# Patient Record
Sex: Female | Born: 1955 | Race: Black or African American | Hispanic: No | Smoking: Never smoker
Health system: Southern US, Community
[De-identification: ages and names within clinical notes are randomized; demographics above are authoritative.]

## PROBLEM LIST (undated history)

## (undated) DIAGNOSIS — Z5189 Encounter for other specified aftercare: Secondary | ICD-10-CM

## (undated) DIAGNOSIS — E039 Hypothyroidism, unspecified: Secondary | ICD-10-CM

## (undated) DIAGNOSIS — G4730 Sleep apnea, unspecified: Secondary | ICD-10-CM

## (undated) DIAGNOSIS — R413 Other amnesia: Secondary | ICD-10-CM

## (undated) DIAGNOSIS — M1990 Unspecified osteoarthritis, unspecified site: Secondary | ICD-10-CM

## (undated) DIAGNOSIS — R011 Cardiac murmur, unspecified: Secondary | ICD-10-CM

## (undated) DIAGNOSIS — D649 Anemia, unspecified: Secondary | ICD-10-CM

## (undated) DIAGNOSIS — Z8249 Family history of ischemic heart disease and other diseases of the circulatory system: Secondary | ICD-10-CM

## (undated) DIAGNOSIS — Z8489 Family history of other specified conditions: Secondary | ICD-10-CM

## (undated) DIAGNOSIS — R002 Palpitations: Secondary | ICD-10-CM

## (undated) DIAGNOSIS — B1920 Unspecified viral hepatitis C without hepatic coma: Secondary | ICD-10-CM

## (undated) DIAGNOSIS — R7303 Prediabetes: Secondary | ICD-10-CM

## (undated) DIAGNOSIS — I10 Essential (primary) hypertension: Secondary | ICD-10-CM

## (undated) DIAGNOSIS — R0602 Shortness of breath: Secondary | ICD-10-CM

## (undated) HISTORY — DX: Cardiac murmur, unspecified: R01.1

## (undated) HISTORY — DX: Encounter for other specified aftercare: Z51.89

## (undated) HISTORY — DX: Family history of ischemic heart disease and other diseases of the circulatory system: Z82.49

## (undated) HISTORY — DX: Unspecified osteoarthritis, unspecified site: M19.90

## (undated) HISTORY — DX: Hypothyroidism, unspecified: E03.9

## (undated) HISTORY — DX: Palpitations: R00.2

## (undated) HISTORY — DX: Unspecified viral hepatitis C without hepatic coma: B19.20

## (undated) HISTORY — PX: LIVER BIOPSY: SHX301

## (undated) HISTORY — DX: Shortness of breath: R06.02

## (undated) HISTORY — DX: Essential (primary) hypertension: I10

## (undated) HISTORY — DX: Other amnesia: R41.3

## (undated) HISTORY — DX: Anemia, unspecified: D64.9

---

## 2013-01-17 DIAGNOSIS — K8020 Calculus of gallbladder without cholecystitis without obstruction: Secondary | ICD-10-CM | POA: Insufficient documentation

## 2013-04-17 DIAGNOSIS — B1920 Unspecified viral hepatitis C without hepatic coma: Secondary | ICD-10-CM

## 2013-04-17 HISTORY — DX: Unspecified viral hepatitis C without hepatic coma: B19.20

## 2013-10-03 ENCOUNTER — Encounter: Payer: Self-pay | Admitting: Cardiovascular Disease

## 2013-10-03 ENCOUNTER — Ambulatory Visit (INDEPENDENT_AMBULATORY_CARE_PROVIDER_SITE_OTHER): Payer: Medicare Other | Admitting: Cardiovascular Disease

## 2013-10-03 VITALS — BP 122/74 | HR 64 | Ht 66.0 in | Wt 195.0 lb

## 2013-10-03 DIAGNOSIS — E785 Hyperlipidemia, unspecified: Secondary | ICD-10-CM

## 2013-10-03 DIAGNOSIS — R0602 Shortness of breath: Secondary | ICD-10-CM

## 2013-10-03 DIAGNOSIS — R002 Palpitations: Secondary | ICD-10-CM

## 2013-10-03 DIAGNOSIS — Z8249 Family history of ischemic heart disease and other diseases of the circulatory system: Secondary | ICD-10-CM

## 2013-10-03 DIAGNOSIS — I10 Essential (primary) hypertension: Secondary | ICD-10-CM | POA: Insufficient documentation

## 2013-10-03 LAB — LIPID PANEL
Cholesterol: 133 mg/dL (ref 0–200)
HDL: 38 mg/dL — ABNORMAL LOW (ref >39)
LDL Cholesterol: 84 mg/dL (ref 0–99)
Total CHOL/HDL Ratio: 3.5 Ratio
Triglycerides: 56 mg/dL (ref <150)
VLDL: 11 mg/dL (ref 0–40)

## 2013-10-03 LAB — COMPLETE METABOLIC PANEL WITH GFR
ALT: 79 U/L — ABNORMAL HIGH (ref 0–35)
AST: 86 U/L — ABNORMAL HIGH (ref 0–37)
Albumin: 4.3 g/dL (ref 3.5–5.2)
Alkaline Phosphatase: 78 U/L (ref 39–117)
BUN: 10 mg/dL (ref 6–23)
CO2: 29 mEq/L (ref 19–32)
Calcium: 10 mg/dL (ref 8.4–10.5)
Chloride: 99 mEq/L (ref 96–112)
Creat: 0.73 mg/dL (ref 0.50–1.10)
GFR, Est African American: >89 mL/min
GFR, Est Non African American: >89 mL/min
Glucose, Bld: 97 mg/dL (ref 70–99)
Potassium: 3.9 mEq/L (ref 3.5–5.3)
Sodium: 139 mEq/L (ref 135–145)
Total Bilirubin: 0.6 mg/dL (ref 0.2–1.2)
Total Protein: 7.7 g/dL (ref 6.0–8.3)

## 2013-10-03 LAB — CBC
HCT: 40.2 % (ref 36.0–46.0)
Hemoglobin: 14 g/dL (ref 12.0–15.0)
MCH: 28.5 pg (ref 26.0–34.0)
MCHC: 34.8 g/dL (ref 30.0–36.0)
MCV: 81.9 fL (ref 78.0–100.0)
Platelets: 236 10*3/uL (ref 150–400)
RBC: 4.91 MIL/uL (ref 3.87–5.11)
RDW: 14.6 % (ref 11.5–15.5)
WBC: 8 10*3/uL (ref 4.0–10.5)

## 2013-10-03 LAB — TSH: TSH: 1.082 u[IU]/mL (ref 0.350–4.500)

## 2013-10-03 NOTE — Assessment & Plan Note (Signed)
Over the last 3-4 weeks she's noticed palpitations associated with dyspnea on exertion and back pain as well as left shoulder pain. These are all new. She does have a strong family history for disease with both parents had myocardial infarctions. Her other cardiac risk factors are only positive for hypotension her. I am going to get routine lab work on her as well as a 2-D echocardiogram, exercise Myoview stress test to rule out an ischemic etiology as well as an event monitor. I will see her back into that for further evaluation

## 2013-10-03 NOTE — Progress Notes (Signed)
10/03/2013 Robyn Montes   Apr 07, 1956  [id number]  Primary Physician Pcp Not In System Primary Cardiologist: Lorretta Harp MD Renae Gloss   HPI:  Robyn Montes is a delightful 58 year old moderately overweight the worst African American female mother of 3 children, grandmother and 2 grandchildren who currently does not work. She does not have a primary care physician here in the [place] area. She was referred by an urgent care clinic for evaluation of nausea palpitations, shortness of breath, back and shoulder pain. Her cardiac risk factors include a strong family history with both parents who died in their mid 23s of myocardial infarctions. She does have a history of hepatitis C associated with blood transfusion. She does not smoke or drink. Her only other factor history of hypertension. She has never had a heart or stroke. The palpitations began 3 weeks ago and are associated with a feeling of breathlessness. At the same time she has noticed pain in her back and shoulders.   Current Outpatient Prescriptions  Medication Sig Dispense Refill  . levothyroxine (SYNTHROID, LEVOTHROID) 75 MCG tablet Take 75 mcg by mouth daily before breakfast.      . losartan-hydrochlorothiazide (HYZAAR) 50-12.5 MG per tablet Take 1 tablet by mouth daily.       No current facility-administered medications for this visit.    No Known Allergies  History   Social History  . Marital Status: Divorced    Spouse Name: N/A    Number of Children: N/A  . Years of Education: N/A   Occupational History  . Not on file.   Social History Main Topics  . Smoking status: Never Smoker   . Smokeless tobacco: Not on file  . Alcohol Use: Not on file  . Drug Use: Not on file  . Sexual Activity: Not on file   Other Topics Concern  . Not on file   Social History Narrative  . No narrative on file     Review of Systems: General: negative for chills, fever, night sweats or weight changes.    Cardiovascular: negative for chest pain, dyspnea on exertion, edema, orthopnea, palpitations, paroxysmal nocturnal dyspnea or shortness of breath Dermatological: negative for rash Respiratory: negative for cough or wheezing Urologic: negative for hematuria Abdominal: negative for nausea, vomiting, diarrhea, bright red blood per rectum, melena, or hematemesis Neurologic: negative for visual changes, syncope, or dizziness All other systems reviewed and are otherwise negative except as noted above.    Blood pressure 122/74, pulse 64, height 5\' 6"  (1.676 m), weight 195 lb (88.451 kg).  General appearance: alert and no distress Neck: no adenopathy, no carotid bruit, no JVD, supple, symmetrical, trachea midline and thyroid not enlarged, symmetric, no tenderness/mass/nodules Lungs: clear to auscultation bilaterally Heart: regular rate and rhythm, S1, S2 normal, no murmur, click, rub or gallop Extremities: extremities normal, atraumatic, no cyanosis or edema  EKG normal sinus rhythm at 64 without ST or T wave changes  ASSESSMENT AND PLAN:   Essential hypertension Controlled on current medications  Palpitations Over the last 3-4 weeks she's noticed palpitations associated with dyspnea on exertion and back pain as well as left shoulder pain. These are all new. She does have a strong family history for disease with both parents had myocardial infarctions. Her other cardiac risk factors are only positive for hypotension her. I am going to get routine lab work on her as well as a 2-D echocardiogram, exercise Myoview stress test to rule out an ischemic etiology as well as an  event monitor. I will see her back into that for further evaluation      Lorretta Harp MD [facility], [facility] 10/03/2013 9:13 AM

## 2013-10-03 NOTE — Assessment & Plan Note (Signed)
Controlled on current medications 

## 2013-10-03 NOTE — Patient Instructions (Signed)
  We will see you back in follow up after the tests.  Dr Gwenlyn Found has ordered : 1.  Echocardiogram. Echocardiography is a painless test that uses sound waves to create images of your heart. It provides your doctor with information about the size and shape of your heart and how well your heart's chambers and valves are working. This procedure takes approximately one hour. There are no restrictions for this procedure.   2. Exercise Myoview- this is a test that looks at the blood flow to your heart muscle.  It takes approximately 2 1/2 hours. Please follow instruction sheet, as given.  3.  Event monitor. Event monitors are medical devices that record the heart's electrical activity. Doctors most often [place] these monitors to diagnose arrhythmias. Arrhythmias are problems with the speed or rhythm of the heartbeat. The monitor is a small, portable device. You can wear one while you do your normal daily activities. This is usually used to diagnose what is causing palpitations/syncope (passing out).  4. Your physician recommends that you return for a FASTING lipid profile

## 2013-10-08 ENCOUNTER — Telehealth: Payer: Self-pay | Admitting: Cardiovascular Disease

## 2013-10-08 NOTE — Telephone Encounter (Signed)
lmom 

## 2013-10-08 NOTE — Telephone Encounter (Signed)
Message forwarded to Curt Bears, RN to advise

## 2013-10-08 NOTE — Telephone Encounter (Signed)
Returning a Call about her lab results .Marland Kitchen Please Call   Thanks

## 2013-10-09 ENCOUNTER — Encounter: Payer: Self-pay | Admitting: *Deleted

## 2013-10-09 ENCOUNTER — Telehealth: Payer: Self-pay | Admitting: Cardiovascular Disease

## 2013-10-09 NOTE — Telephone Encounter (Signed)
Please call again.

## 2013-10-09 NOTE — Telephone Encounter (Signed)
Spoke with patient and reviewed lab results. 

## 2013-10-09 NOTE — Telephone Encounter (Signed)
Spoke with patient.  She found a copy of some old labs.  Her liver function is actually improved since previous blood draw.  Patient will still follow up with a primary doctor.

## 2013-10-10 ENCOUNTER — Telehealth (HOSPITAL_COMMUNITY): Payer: Self-pay

## 2013-10-14 ENCOUNTER — Encounter (HOSPITAL_COMMUNITY): Payer: Medicare Other

## 2013-10-16 ENCOUNTER — Ambulatory Visit (HOSPITAL_COMMUNITY)
Admission: RE | Admit: 2013-10-16 | Discharge: 2013-10-16 | Disposition: A | Discharge status: Home / Self Care | Payer: Medicare PPO | Source: Ambulatory Visit | Attending: Internal Medicine | Admitting: Internal Medicine

## 2013-10-16 DIAGNOSIS — R0602 Shortness of breath: Secondary | ICD-10-CM | POA: Diagnosis present

## 2013-10-16 DIAGNOSIS — I519 Heart disease, unspecified: Secondary | ICD-10-CM

## 2013-10-16 NOTE — Progress Notes (Signed)
2D Echocardiogram Complete.  10/16/2013   Bethany McMahill, RDCS

## 2013-10-19 ENCOUNTER — Emergency Department (HOSPITAL_COMMUNITY)
Admission: EM | Admit: 2013-10-19 | Discharge: 2013-10-19 | Disposition: A | Discharge status: Home / Self Care | Payer: Medicare PPO | Attending: Emergency Medicine | Admitting: Emergency Medicine

## 2013-10-19 ENCOUNTER — Encounter (HOSPITAL_COMMUNITY): Payer: Self-pay | Admitting: Emergency Medicine

## 2013-10-19 DIAGNOSIS — Y9389 Activity, other specified: Secondary | ICD-10-CM | POA: Insufficient documentation

## 2013-10-19 DIAGNOSIS — I10 Essential (primary) hypertension: Secondary | ICD-10-CM | POA: Diagnosis not present

## 2013-10-19 DIAGNOSIS — K521 Toxic gastroenteritis and colitis: Secondary | ICD-10-CM | POA: Diagnosis not present

## 2013-10-19 DIAGNOSIS — R42 Dizziness and giddiness: Secondary | ICD-10-CM | POA: Insufficient documentation

## 2013-10-19 DIAGNOSIS — T628X1A Toxic effect of other specified noxious substances eaten as food, accidental (unintentional), initial encounter: Secondary | ICD-10-CM | POA: Diagnosis not present

## 2013-10-19 DIAGNOSIS — R112 Nausea with vomiting, unspecified: Secondary | ICD-10-CM | POA: Insufficient documentation

## 2013-10-19 DIAGNOSIS — Y929 Unspecified place or not applicable: Secondary | ICD-10-CM | POA: Insufficient documentation

## 2013-10-19 DIAGNOSIS — Z862 Personal history of diseases of the blood and blood-forming organs and certain disorders involving the immune mechanism: Secondary | ICD-10-CM | POA: Diagnosis not present

## 2013-10-19 DIAGNOSIS — E039 Hypothyroidism, unspecified: Secondary | ICD-10-CM | POA: Insufficient documentation

## 2013-10-19 DIAGNOSIS — Z79899 Other long term (current) drug therapy: Secondary | ICD-10-CM | POA: Diagnosis not present

## 2013-10-19 DIAGNOSIS — R109 Unspecified abdominal pain: Secondary | ICD-10-CM | POA: Diagnosis present

## 2013-10-19 DIAGNOSIS — A059 Bacterial foodborne intoxication, unspecified: Secondary | ICD-10-CM | POA: Diagnosis not present

## 2013-10-19 LAB — CBC WITH DIFFERENTIAL/PLATELET
Basophils Absolute: 0 10*3/uL (ref 0.0–0.1)
Basophils Relative: 0 % (ref 0–1)
Eosinophils Absolute: 0.3 10*3/uL (ref 0.0–0.7)
Eosinophils Relative: 3 % (ref 0–5)
HCT: 39 % (ref 36.0–46.0)
Hemoglobin: 13.3 g/dL (ref 12.0–15.0)
Lymphocytes Relative: 40 % (ref 12–46)
Lymphs Abs: 3.9 10*3/uL (ref 0.7–4.0)
MCH: 29 pg (ref 26.0–34.0)
MCHC: 34.1 g/dL (ref 30.0–36.0)
MCV: 85.2 fL (ref 78.0–100.0)
Monocytes Absolute: 0.7 10*3/uL (ref 0.1–1.0)
Monocytes Relative: 7 % (ref 3–12)
Neutro Abs: 5 10*3/uL (ref 1.7–7.7)
Neutrophils Relative %: 50 % (ref 43–77)
Platelets: 228 10*3/uL (ref 150–400)
RBC: 4.58 MIL/uL (ref 3.87–5.11)
RDW: 13.6 % (ref 11.5–15.5)
WBC: 9.9 10*3/uL (ref 4.0–10.5)

## 2013-10-19 LAB — I-STAT CHEM 8, ED
BUN: 10 mg/dL (ref 6–23)
Calcium, Ion: 1.16 mmol/L (ref 1.12–1.23)
Chloride: 103 mEq/L (ref 96–112)
Creatinine, Ser: 0.7 mg/dL (ref 0.50–1.10)
Glucose, Bld: 99 mg/dL (ref 70–99)
HCT: 43 % (ref 36.0–46.0)
Hemoglobin: 14.6 g/dL (ref 12.0–15.0)
Potassium: 3.6 mEq/L — ABNORMAL LOW (ref 3.7–5.3)
Sodium: 141 mEq/L (ref 137–147)
TCO2: 25 mmol/L (ref 0–100)

## 2013-10-19 LAB — MAGNESIUM: Magnesium: 2.1 mg/dL (ref 1.5–2.5)

## 2013-10-19 MED ORDER — ONDANSETRON 4 MG PO TBDP: 4.0000 mg | ORAL_TABLET | Freq: Once | ORAL | Status: DC

## 2013-10-19 MED ORDER — ONDANSETRON 8 MG PO TBDP: 8.0000 mg | ORAL_TABLET | Freq: Three times a day (TID) | ORAL | Status: DC | PRN

## 2013-10-19 NOTE — Discharge Instructions (Signed)
Conrinue to hydrate well with pedialyte. Start with liquid diet and bland diet - slowly advancing to regular foods. Return to the ER if there is fevers, bloody stools.   Food Poisoning Food poisoning is an illness caused by something you ate or drank. There are over 250 known causes of food poisoning. However, many other causes are unknown.You can be treated even if the exact cause of your food poisoning is not known. In most cases, food poisoning is mild and lasts 1 to 2 days. However, some cases can be serious, especially for people with low immune systems, the elderly, children and infants, and pregnant women. CAUSES  Poor personal hygiene, improper cleaning of storage and preparation areas, and unclean utensils can cause infection or tainting (contamination) of foods. The causes of food poisoning are numerous.Infectious agents, such as viruses, bacteria, or parasites, can cause harm by infecting the intestine and disrupting the absorption of nutrients and water. This can cause diarrhea and lead to dehydration. Viruses are responsible for most of the food poisonings in which an agent is found. Parasites are less likely to cause food poisoning. Toxic agents, such as poisonous mushrooms, marine algae, and pesticides can also cause food poisoning.  Viral causes of food poisoning include:  Norovirus.  Rotavirus.  Hepatitis A.  Bacterial causes of food poisoning include:  Salmonellae.  Campylobacter.  Bacillus cereus.  Escherichia coli (E. coli).  Shigella.  Listeria monocytogenes.  Clostridium botulinum (botulism).  Vibrio cholerae.  Parasites that can cause food poisoning include:  Giardia.  Cryptosporidium.  Toxoplasma. SYMPTOMS Symptoms may appear several hours or longer after consuming the contaminated food or drink. Symptoms may include:  Nausea.  Vomiting.  Cramping.  Diarrhea.  Fever and chills.  Muscle aches. DIAGNOSIS Your caregiver may be able to  diagnose food poisoning from a list of what you have recently eaten and results from lab tests. Diagnostic tests may include an exam of the feces. TREATMENT In most cases, treatment focuses on helping to relieve your symptoms and staying well hydrated. Antibiotics are rarely needed. In severe cases, hospitalization may be required. PREVENTION   Wash your hands, food preparation surfaces, and utensils thoroughly before and after handling raw foods.  Keep refrigerated foods below 40 F (5 C).  Serve hot foods immediately or keep them heated above 140 F (60 C).  Divide large volumes of food into small portions for rapid cooling in the refrigerator. Hot, bulky foods in the refrigerator can raise the temperature of other foods that have already cooled.  Follow approved canning procedures.  Heat canned foods thoroughly before tasting.  When in doubt, throw it out.  Infants, the elderly, women who are pregnant, and people with compromised immune systems are especially susceptible to food poisoning. These people should never consume unpasteurized cheese, unpasteurized cider, raw fish, raw seafood, or raw meat type products. HOME CARE INSTRUCTIONS   Drink enough water and fluids to keep your urine clear or pale yellow. Drink small amounts of fluids frequently and increase as tolerated.  Ask your caregiver for specific rehydration instructions.  Avoid:  Foods high in sugar.  Alcohol.  Carbonated drinks.  Tobacco.  Juice.  Caffeine drinks.  Extremely hot or cold fluids.  Fatty, greasy foods.  Too much intake of anything at one time.  Dairy products until 24 to 48 hours after diarrhea stops.  You may consume probiotics. Probiotics are active cultures of beneficial bacteria. They may lessen the amount and number of diarrheal stools in adults. Probiotics  can be found in yogurt with active cultures and in supplements.  Wash your hands well to avoid spreading the  bacteria.  Only take over-the-counter or prescription medicines for pain, discomfort, or fever as directed by your caregiver. Do not give aspirin to children.  Ask your caregiver if you should continue to take your regular prescribed and over-the-counter medicines. SEEK IMMEDIATE MEDICAL CARE IF:   You have difficulty breathing, swallowing, talking, or moving.  You develop blurred vision.  You are unable to keep fluids down.  You faint or nearly faint.  Your eyes turn yellow.  Vomiting or diarrhea develops or becomes persistent.  Abdominal pain develops, increases, or localizes in one small area.  You have a fever.  The diarrhea becomes excessive or contains blood or mucus.  You develop excessive weakness, dizziness, or extreme thirst.  You have no urine for 8 hours. MAKE SURE YOU:   Understand these instructions.  Will watch your condition.  Will get help right away if you are not doing well or get worse. Document Released: 12/31/2003 Document Revised: 06/26/2011 Document Reviewed: 08/18/2010 [facility] Patient Information 2015 [place], [place]. This information is not intended to replace advice given to you by your health care provider. Make sure you discuss any questions you have with your health care provider.

## 2013-10-19 NOTE — ED Provider Notes (Signed)
CSN: [id number]     Arrival date & time 10/19/13  0020 History   First MD Initiated Contact with Patient 10/19/13 0026     Chief Complaint  Patient presents with  . Abdominal Pain     (Consider location/radiation/quality/duration/timing/severity/associated sxs/prior Treatment) HPI Comments: Pt comes in with cc of nausea, emesis, diarrhea. Pt started having her sx y'day - after she ate some ham. Other people who consumed the meat also got sick - and they noticed that ham had expired. Pt is having crampy abd pain, and has had emesis x 3 and diarrhea like BM x 2. Pt has been taking Pedialyte. She started feeling a bit dizzy - and thus decided to come to the ER.  Patient is a 58 y.o. female presenting with abdominal pain. The history is provided by the patient.  Abdominal Pain Associated symptoms: nausea and vomiting   Associated symptoms: no chest pain, no dysuria and no shortness of breath     Past Medical History  Diagnosis Date  . HTN (hypertension)   . Anemia 1976, 1984, 2001, 2003    history of multiple transfusions  . Hypothyroid   . Hepatitis C     acquired from blood transfusion  . Family history of heart disease   . Palpitations   . Shortness of breath    History reviewed. No pertinent past surgical history. Family History  Problem Relation Age of Onset  . Heart disease Mother     heart attack and stroke; passed away in her 46s  . Heart disease Father     passed away in his 49s  . Diabetes Mother   . Diabetes Sister   . Diabetes Brother    History  Substance Use Topics  . Smoking status: Never Smoker   . Smokeless tobacco: Not on file  . Alcohol Use: Not on file   OB History   Grav Para Term Preterm Abortions TAB SAB Ect Mult Living                 Review of Systems  Respiratory: Negative for shortness of breath.   Cardiovascular: Negative for chest pain.  Gastrointestinal: Positive for nausea, vomiting and abdominal pain.  Genitourinary: Negative for  dysuria.  Musculoskeletal: Negative for neck pain.  Neurological: Positive for dizziness. Negative for headaches.      Allergies  Review of patient's allergies indicates no known allergies.  Home Medications   Prior to Admission medications   Medication Sig Start Date End Date Taking? Authorizing Provider  levothyroxine (SYNTHROID, LEVOTHROID) 75 MCG tablet Take 75 mcg by mouth daily before breakfast.   Yes Historical Provider, MD  losartan-hydrochlorothiazide (HYZAAR) 50-12.5 MG per tablet Take 1 tablet by mouth daily.   Yes Historical Provider, MD  ondansetron (ZOFRAN ODT) 8 MG disintegrating tablet Take 1 tablet (8 mg total) by mouth every 8 (eight) hours as needed for nausea. 10/19/13   Ankit Kathrynn Humble, MD   BP 123/84  Pulse 75  Temp(Src) 98 F (36.7 C) (Oral)  Resp 17  Wt 195 lb 5 oz (88.593 kg)  SpO2 99% Physical Exam  Nursing note and vitals reviewed. Constitutional: She appears well-developed.  HENT:  Moist mucosa  Eyes: Conjunctivae are normal.  Neck: Neck supple.  Cardiovascular: Normal rate.   Pulmonary/Chest: Effort normal.  Abdominal: Soft. Bowel sounds are normal. She exhibits no distension. There is no tenderness.    ED Course  Procedures (including critical care time) Labs Review Labs Reviewed  I-STAT CHEM 8, ED -  Abnormal; Notable for the following:    Potassium 3.6 (*)    All other components within normal limits  CBC WITH DIFFERENTIAL  MAGNESIUM    Imaging Review No results found.   EKG Interpretation None      MDM   Final diagnoses:  Food poisoning  Gastroenteritis and colitis, toxic    Pt comes in with cc of n/v/diarrhea/abd pain. Vitals are stable and she is not appearing dehydrated - ? Orthostatics based on hx. Labs are good, oral challenge passed - we will d/c with zofran and return precautions have been discussed.  Varney Biles, MD 10/19/13 [phone]

## 2013-10-19 NOTE — ED Notes (Signed)
The pt has had abd  Pain with nv and diarrhea since last pm when they ate some ham.. She thinks  She is having food poisoning. She is here with her daughter.    lmp none

## 2013-10-19 NOTE — ED Notes (Signed)
Pt pulled off monitor wire, refused monitoring.

## 2013-10-20 ENCOUNTER — Telehealth (HOSPITAL_COMMUNITY): Payer: Self-pay | Admitting: *Deleted

## 2013-10-20 ENCOUNTER — Encounter: Payer: Self-pay | Admitting: *Deleted

## 2013-10-23 NOTE — Telephone Encounter (Signed)
Encounter complete. 

## 2013-10-27 ENCOUNTER — Telehealth (HOSPITAL_COMMUNITY): Payer: Self-pay | Admitting: *Deleted

## 2013-10-28 ENCOUNTER — Telehealth (HOSPITAL_COMMUNITY): Payer: Self-pay | Admitting: *Deleted

## 2013-10-28 ENCOUNTER — Telehealth: Payer: Self-pay | Admitting: Cardiovascular Disease

## 2013-10-29 ENCOUNTER — Encounter: Payer: Self-pay | Admitting: Cardiovascular Disease

## 2013-10-29 NOTE — Telephone Encounter (Signed)
Closed encounter °

## 2013-11-11 ENCOUNTER — Encounter: Payer: Self-pay | Admitting: Cardiovascular Disease

## 2013-11-11 ENCOUNTER — Ambulatory Visit: Payer: Medicare Other | Admitting: Cardiovascular Disease

## 2013-12-08 ENCOUNTER — Emergency Department (HOSPITAL_COMMUNITY): Payer: No Typology Code available for payment source

## 2013-12-08 ENCOUNTER — Encounter (HOSPITAL_COMMUNITY): Payer: Self-pay | Admitting: Emergency Medicine

## 2013-12-08 ENCOUNTER — Emergency Department (HOSPITAL_COMMUNITY)
Admission: EM | Admit: 2013-12-08 | Discharge: 2013-12-08 | Disposition: A | Discharge status: Home / Self Care | Payer: No Typology Code available for payment source | Attending: Emergency Medicine | Admitting: Emergency Medicine

## 2013-12-08 DIAGNOSIS — Z862 Personal history of diseases of the blood and blood-forming organs and certain disorders involving the immune mechanism: Secondary | ICD-10-CM | POA: Insufficient documentation

## 2013-12-08 DIAGNOSIS — Z8619 Personal history of other infectious and parasitic diseases: Secondary | ICD-10-CM | POA: Insufficient documentation

## 2013-12-08 DIAGNOSIS — S99929A Unspecified injury of unspecified foot, initial encounter: Secondary | ICD-10-CM

## 2013-12-08 DIAGNOSIS — Z792 Long term (current) use of antibiotics: Secondary | ICD-10-CM | POA: Diagnosis not present

## 2013-12-08 DIAGNOSIS — E039 Hypothyroidism, unspecified: Secondary | ICD-10-CM | POA: Insufficient documentation

## 2013-12-08 DIAGNOSIS — Y9389 Activity, other specified: Secondary | ICD-10-CM | POA: Diagnosis not present

## 2013-12-08 DIAGNOSIS — Z79899 Other long term (current) drug therapy: Secondary | ICD-10-CM | POA: Insufficient documentation

## 2013-12-08 DIAGNOSIS — S8990XA Unspecified injury of unspecified lower leg, initial encounter: Secondary | ICD-10-CM | POA: Insufficient documentation

## 2013-12-08 DIAGNOSIS — Y92410 Unspecified street and highway as the place of occurrence of the external cause: Secondary | ICD-10-CM | POA: Diagnosis not present

## 2013-12-08 DIAGNOSIS — S8992XA Unspecified injury of left lower leg, initial encounter: Secondary | ICD-10-CM

## 2013-12-08 DIAGNOSIS — S298XXA Other specified injuries of thorax, initial encounter: Secondary | ICD-10-CM | POA: Diagnosis present

## 2013-12-08 DIAGNOSIS — S99919A Unspecified injury of unspecified ankle, initial encounter: Secondary | ICD-10-CM

## 2013-12-08 DIAGNOSIS — I10 Essential (primary) hypertension: Secondary | ICD-10-CM | POA: Insufficient documentation

## 2013-12-08 DIAGNOSIS — S299XXA Unspecified injury of thorax, initial encounter: Secondary | ICD-10-CM

## 2013-12-08 MED ORDER — NAPROXEN 500 MG PO TABS: 500.0000 mg | ORAL_TABLET | Freq: Two times a day (BID) | ORAL | Status: DC
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
## 2013-12-08 NOTE — Discharge Instructions (Signed)

## 2014-01-06 ENCOUNTER — Other Ambulatory Visit: Payer: Self-pay | Admitting: Internal Medicine

## 2014-01-06 DIAGNOSIS — B182 Chronic viral hepatitis C: Secondary | ICD-10-CM

## 2014-01-14 ENCOUNTER — Ambulatory Visit
Admission: RE | Admit: 2014-01-14 | Discharge: 2014-01-14 | Disposition: A | Discharge status: Home / Self Care | Payer: Medicare PPO | Source: Ambulatory Visit | Attending: Internal Medicine | Admitting: Internal Medicine

## 2014-01-14 ENCOUNTER — Encounter (INDEPENDENT_AMBULATORY_CARE_PROVIDER_SITE_OTHER): Payer: Self-pay

## 2014-01-14 DIAGNOSIS — B182 Chronic viral hepatitis C: Secondary | ICD-10-CM

## 2014-01-19 ENCOUNTER — Other Ambulatory Visit: Payer: Self-pay

## 2014-01-30 ENCOUNTER — Other Ambulatory Visit: Payer: Self-pay | Admitting: Family Medicine

## 2014-01-30 DIAGNOSIS — R102 Pelvic and perineal pain: Secondary | ICD-10-CM

## 2014-02-03 ENCOUNTER — Other Ambulatory Visit: Payer: Medicare PPO

## 2014-02-04 ENCOUNTER — Other Ambulatory Visit: Payer: Medicare PPO

## 2014-02-10 ENCOUNTER — Ambulatory Visit
Admission: RE | Admit: 2014-02-10 | Discharge: 2014-02-10 | Disposition: A | Discharge status: Home / Self Care | Payer: Medicare PPO | Source: Ambulatory Visit | Attending: Family Medicine | Admitting: Family Medicine

## 2014-02-10 DIAGNOSIS — R102 Pelvic and perineal pain: Secondary | ICD-10-CM

## 2014-02-18 ENCOUNTER — Other Ambulatory Visit: Payer: Self-pay | Admitting: Family Medicine

## 2014-02-18 DIAGNOSIS — R928 Other abnormal and inconclusive findings on diagnostic imaging of breast: Secondary | ICD-10-CM

## 2014-03-04 ENCOUNTER — Other Ambulatory Visit: Payer: Medicare PPO

## 2014-04-17 LAB — HM PAP SMEAR: HM Pap smear: NORMAL

## 2014-07-15 ENCOUNTER — Encounter: Payer: Medicare HMO | Admitting: Family Medicine

## 2014-07-15 ENCOUNTER — Other Ambulatory Visit: Payer: Self-pay | Admitting: *Deleted

## 2014-07-15 ENCOUNTER — Ambulatory Visit (INDEPENDENT_AMBULATORY_CARE_PROVIDER_SITE_OTHER): Payer: Medicare HMO | Admitting: Family Medicine

## 2014-07-15 DIAGNOSIS — I10 Essential (primary) hypertension: Secondary | ICD-10-CM

## 2014-07-15 DIAGNOSIS — Z0000 Encounter for general adult medical examination without abnormal findings: Secondary | ICD-10-CM

## 2014-07-15 DIAGNOSIS — E038 Other specified hypothyroidism: Secondary | ICD-10-CM

## 2014-07-15 DIAGNOSIS — E034 Atrophy of thyroid (acquired): Secondary | ICD-10-CM

## 2014-07-15 NOTE — Progress Notes (Signed)
Subjective:    Patient ID: Robyn Montes, female    DOB: March 17, 1956, 59 y.o.   MRN: [id number]  07/15/2014  No chief complaint on file.   HPI This 59 y.o. female presents for Annual Wellness Examination.  Last physical: Pap smear: Mammogram: Colonoscopy: Bone density: TDAP: Pneumovax: Zostavax: Influenza: Eye exam: Dental exam:    Review of Systems  Constitutional: Negative for fever, chills, diaphoresis, activity change, appetite change, fatigue and unexpected weight change.  HENT: Negative for congestion, dental problem, drooling, ear discharge, ear pain, facial swelling, hearing loss, mouth sores, nosebleeds, postnasal drip, rhinorrhea, sinus pressure, sneezing, sore throat, tinnitus, trouble swallowing and voice change.   Eyes: Negative for photophobia, pain, discharge, redness, itching and visual disturbance.  Respiratory: Negative for apnea, cough, choking, chest tightness, shortness of breath, wheezing and stridor.   Cardiovascular: Negative for chest pain, palpitations and leg swelling.  Gastrointestinal: Negative for nausea, vomiting, abdominal pain, diarrhea, constipation, blood in stool, abdominal distention, anal bleeding and rectal pain.  Endocrine: Negative for cold intolerance, heat intolerance, polydipsia, polyphagia and polyuria.  Genitourinary: Negative for dysuria, urgency, frequency, hematuria, flank pain, decreased urine volume, vaginal bleeding, vaginal discharge, enuresis, difficulty urinating, genital sores, vaginal pain, menstrual problem, pelvic pain and dyspareunia.  Musculoskeletal: Negative for myalgias, back pain, joint swelling, arthralgias, gait problem, neck pain and neck stiffness.  Skin: Negative for color change, pallor, rash and wound.  Allergic/Immunologic: Negative for environmental allergies, food allergies and immunocompromised state.  Neurological: Negative for dizziness, tremors, seizures, syncope, facial asymmetry, speech  difficulty, weakness, light-headedness, numbness and headaches.  Hematological: Negative for adenopathy. Does not bruise/bleed easily.  Psychiatric/Behavioral: Negative for suicidal ideas, hallucinations, behavioral problems, confusion, sleep disturbance, self-injury, dysphoric mood, decreased concentration and agitation. The patient is not nervous/anxious and is not hyperactive.     Past Medical History  Diagnosis Date  . HTN (hypertension)   . Anemia 1976, 1984, 2001, 2003    history of multiple transfusions  . Hypothyroid   . Hepatitis C     acquired from blood transfusion  . Family history of heart disease   . Palpitations   . Shortness of breath    No past surgical history on file. No Known Allergies Current Outpatient Prescriptions  Medication Sig Dispense Refill  . levothyroxine (SYNTHROID, LEVOTHROID) 75 MCG tablet Take 75 mcg by mouth daily before breakfast.    . losartan-hydrochlorothiazide (HYZAAR) 50-12.5 MG per tablet Take 1 tablet by mouth daily.    . naproxen (NAPROSYN) 500 MG tablet Take 1 tablet (500 mg total) by mouth 2 (two) times daily. 30 tablet 0  . neomycin-bacitracin-polymyxin (NEOSPORIN) ointment Apply 1 application topically as needed for wound care. apply to eye    . Tetrahydroz-Glyc-Hyprom-PEG (VISINE MAXIMUM REDNESS RELIEF) 0.05-0.2-0.36-1 % SOLN Apply 2-3 drops to eye daily as needed (for eye redness).     No current facility-administered medications for this visit.       Objective:    There were no vitals taken for this visit. Physical Exam  Constitutional: She is oriented to person, place, and time. She appears well-developed and well-nourished. No distress.  HENT:  Head: Normocephalic and atraumatic.  Right Ear: External ear normal.  Left Ear: External ear normal.  Nose: Nose normal.  Mouth/Throat: Oropharynx is clear and moist.  Eyes: Conjunctivae and EOM are normal. Pupils are equal, round, and reactive to light.  Neck: Normal range of  motion and full passive range of motion without pain. Neck supple. No JVD present. Carotid bruit  is not present. No thyromegaly present.  Cardiovascular: Normal rate, regular rhythm and normal heart sounds.  Exam reveals no gallop and no friction rub.   No murmur heard. Pulmonary/Chest: Effort normal and breath sounds normal. She has no wheezes. She has no rales.  Abdominal: Soft. Bowel sounds are normal. She exhibits no distension and no mass. There is no tenderness. There is no rebound and no guarding.  Musculoskeletal:       Right shoulder: Normal.       Left shoulder: Normal.       Cervical back: Normal.  Lymphadenopathy:    She has no cervical adenopathy.  Neurological: She is alert and oriented to person, place, and time. She has normal reflexes. No cranial nerve deficit. She exhibits normal muscle tone. Coordination normal.  Skin: Skin is warm and dry. No rash noted. She is not diaphoretic. No erythema. No pallor.  Psychiatric: She has a normal mood and affect. Her behavior is normal. Judgment and thought content normal.  Nursing note and vitals reviewed.  Results for orders placed or performed during the hospital encounter of 10/19/13  CBC with Differential  Result Value Ref Range   WBC 9.9 4.0 - 10.5 K/uL   RBC 4.58 3.87 - 5.11 MIL/uL   Hemoglobin 13.3 12.0 - 15.0 g/dL   HCT 39.0 36.0 - 46.0 %   MCV 85.2 78.0 - 100.0 fL   MCH 29.0 26.0 - 34.0 pg   MCHC 34.1 30.0 - 36.0 g/dL   RDW 13.6 11.5 - 15.5 %   Platelets 228 150 - 400 K/uL   Neutrophils Relative % 50 43 - 77 %   Neutro Abs 5.0 1.7 - 7.7 K/uL   Lymphocytes Relative 40 12 - 46 %   Lymphs Abs 3.9 0.7 - 4.0 K/uL   Monocytes Relative 7 3 - 12 %   Monocytes Absolute 0.7 0.1 - 1.0 K/uL   Eosinophils Relative 3 0 - 5 %   Eosinophils Absolute 0.3 0.0 - 0.7 K/uL   Basophils Relative 0 0 - 1 %   Basophils Absolute 0.0 0.0 - 0.1 K/uL  Magnesium  Result Value Ref Range   Magnesium 2.1 1.5 - 2.5 mg/dL  I-stat chem 8, ed    Result Value Ref Range   Sodium 141 137 - 147 mEq/L   Potassium 3.6 (L) 3.7 - 5.3 mEq/L   Chloride 103 96 - 112 mEq/L   BUN 10 6 - 23 mg/dL   Creatinine, Ser 0.70 0.50 - 1.10 mg/dL   Glucose, Bld 99 70 - 99 mg/dL   Calcium, Ion 1.16 1.12 - 1.23 mmol/L   TCO2 25 0 - 100 mmol/L   Hemoglobin 14.6 12.0 - 15.0 g/dL   HCT 43.0 36.0 - 46.0 %       Assessment & Plan:   1. Encounter for Medicare annual wellness exam   2. Hypothyroidism due to acquired atrophy of thyroid   3. Essential hypertension, benign     No orders of the defined types were placed in this encounter.    No Follow-up on file.     Kristi Elayne Guerin, M.D. [facility] [address] [place], [place]  [address] [phone] phone [phone] fax  This encounter was created in error - please disregard.

## 2014-07-16 LAB — COMPLETE METABOLIC PANEL WITH GFR
ALT: 13 U/L (ref 0–35)
AST: 15 U/L (ref 0–37)
Albumin: 4.1 g/dL (ref 3.5–5.2)
Alkaline Phosphatase: 68 U/L (ref 39–117)
BUN: 11 mg/dL (ref 6–23)
CO2: 28 mEq/L (ref 19–32)
Calcium: 9.3 mg/dL (ref 8.4–10.5)
Chloride: 103 mEq/L (ref 96–112)
Creat: 0.68 mg/dL (ref 0.50–1.10)
GFR, Est African American: >89 mL/min
GFR, Est Non African American: >89 mL/min
Glucose, Bld: 90 mg/dL (ref 70–99)
Potassium: 4 mEq/L (ref 3.5–5.3)
Sodium: 140 mEq/L (ref 135–145)
Total Bilirubin: 0.5 mg/dL (ref 0.2–1.2)
Total Protein: 7.4 g/dL (ref 6.0–8.3)

## 2014-07-16 LAB — CBC WITH DIFFERENTIAL/PLATELET
Basophils Absolute: 0 10*3/uL (ref 0.0–0.1)
Basophils Relative: 0 % (ref 0–1)
Eosinophils Absolute: 0.2 10*3/uL (ref 0.0–0.7)
Eosinophils Relative: 2 % (ref 0–5)
HCT: 38.6 % (ref 36.0–46.0)
Hemoglobin: 12.9 g/dL (ref 12.0–15.0)
Lymphocytes Relative: 30 % (ref 12–46)
Lymphs Abs: 2.4 10*3/uL (ref 0.7–4.0)
MCH: 28 pg (ref 26.0–34.0)
MCHC: 33.4 g/dL (ref 30.0–36.0)
MCV: 83.7 fL (ref 78.0–100.0)
MPV: 11.7 fL (ref 8.6–12.4)
Monocytes Absolute: 0.5 10*3/uL (ref 0.1–1.0)
Monocytes Relative: 6 % (ref 3–12)
Neutro Abs: 5 10*3/uL (ref 1.7–7.7)
Neutrophils Relative %: 62 % (ref 43–77)
Platelets: 219 10*3/uL (ref 150–400)
RBC: 4.61 MIL/uL (ref 3.87–5.11)
RDW: 14.5 % (ref 11.5–15.5)
WBC: 8 10*3/uL (ref 4.0–10.5)

## 2014-07-16 LAB — T4, FREE: Free T4: 1 ng/dL (ref 0.80–1.80)

## 2014-07-16 LAB — TSH: TSH: 4.808 u[IU]/mL — ABNORMAL HIGH (ref 0.350–4.500)

## 2014-07-16 NOTE — Progress Notes (Signed)
Lab visit only; patient presented for labs and then cancelled appointment that was later in day.

## 2014-08-03 ENCOUNTER — Encounter: Payer: Self-pay | Admitting: Family Medicine

## 2014-08-03 ENCOUNTER — Ambulatory Visit (INDEPENDENT_AMBULATORY_CARE_PROVIDER_SITE_OTHER): Payer: Medicare HMO | Admitting: Family Medicine

## 2014-08-03 VITALS — BP 128/74 | HR 88 | Temp 97.9°F | Resp 16 | Ht 65.5 in | Wt 213.8 lb

## 2014-08-03 DIAGNOSIS — Z1231 Encounter for screening mammogram for malignant neoplasm of breast: Secondary | ICD-10-CM

## 2014-08-03 DIAGNOSIS — R413 Other amnesia: Secondary | ICD-10-CM | POA: Diagnosis not present

## 2014-08-03 DIAGNOSIS — R002 Palpitations: Secondary | ICD-10-CM

## 2014-08-03 DIAGNOSIS — Z7251 High risk heterosexual behavior: Secondary | ICD-10-CM

## 2014-08-03 DIAGNOSIS — E559 Vitamin D deficiency, unspecified: Secondary | ICD-10-CM

## 2014-08-03 DIAGNOSIS — R319 Hematuria, unspecified: Secondary | ICD-10-CM

## 2014-08-03 DIAGNOSIS — L700 Acne vulgaris: Secondary | ICD-10-CM | POA: Diagnosis not present

## 2014-08-03 DIAGNOSIS — Z01419 Encounter for gynecological examination (general) (routine) without abnormal findings: Secondary | ICD-10-CM | POA: Diagnosis not present

## 2014-08-03 DIAGNOSIS — E038 Other specified hypothyroidism: Secondary | ICD-10-CM | POA: Diagnosis not present

## 2014-08-03 DIAGNOSIS — I10 Essential (primary) hypertension: Secondary | ICD-10-CM | POA: Diagnosis not present

## 2014-08-03 DIAGNOSIS — M7989 Other specified soft tissue disorders: Secondary | ICD-10-CM | POA: Diagnosis not present

## 2014-08-03 DIAGNOSIS — E034 Atrophy of thyroid (acquired): Secondary | ICD-10-CM | POA: Diagnosis not present

## 2014-08-03 DIAGNOSIS — B182 Chronic viral hepatitis C: Secondary | ICD-10-CM

## 2014-08-03 DIAGNOSIS — Z124 Encounter for screening for malignant neoplasm of cervix: Secondary | ICD-10-CM

## 2014-08-03 DIAGNOSIS — Z1211 Encounter for screening for malignant neoplasm of colon: Secondary | ICD-10-CM

## 2014-08-03 DIAGNOSIS — E669 Obesity, unspecified: Secondary | ICD-10-CM

## 2014-08-03 DIAGNOSIS — Z0000 Encounter for general adult medical examination without abnormal findings: Secondary | ICD-10-CM | POA: Diagnosis not present

## 2014-08-03 LAB — POCT UA - MICROSCOPIC ONLY
Bacteria, U Microscopic: NEGATIVE
Casts, Ur, LPF, POC: NEGATIVE
Crystals, Ur, HPF, POC: NEGATIVE
Mucus, UA: NEGATIVE

## 2014-08-03 LAB — POCT URINALYSIS DIPSTICK
Bilirubin, UA: NEGATIVE
Blood, UA: NEGATIVE
Glucose, UA: NEGATIVE
Leukocytes, UA: NEGATIVE
Nitrite, UA: NEGATIVE
Protein, UA: NEGATIVE
Spec Grav, UA: 1.025
Urobilinogen, UA: 0.2
pH, UA: 5

## 2014-08-03 LAB — IFOBT (OCCULT BLOOD): IFOBT: NEGATIVE

## 2014-08-03 NOTE — Progress Notes (Signed)
Subjective:    Patient ID: Robyn Montes, female    DOB: 30-Aug-1955, 59 y.o.   MRN: [id number]  HPI This 59 y.o. female presents for Annual Wellness Examination and to establish care.  Last physical:  2014 at [facility] Pap smear:  2014; history of fibroids. Mammogram:  drainage from R breast in 2014.  Due for repeat. Colonoscopy:  Recommend Virtual colonoscopy; never underwent colonoscopy; several family members do not tolerate anesthesia. TDAP:  More than six years; not more than ten years. Pneumovax:  never Influenza:  2015 Eye exam:  Every year; 2015; reading glasses; no glaucoma or cataracts. Dental exam:  Full teeth extraction; chronic anemia.   Hypothyroidism:  Patient reports good compliance with medication, good tolerance to medication, and good symptom control.  Significant weight gain in past year.  +swelling.     HTN: Patient reports good compliance with medication, good tolerance to medication, and good symptom control.    Losartan-HCTZ 50-12.5 one daily. Also has Metoprolol for PRN use.  Chronic palpitations; Metoprolol prescribed PRN for palpitations.  Suffering with worsening swelling in past several weeks; requesting additional diuretic.     Palpitations: evaluated by Dr. Gwenlyn Found in 09/2013.  S/p echo, cardiolite, event monitor. Echo WNL other than grade 1 diastolic dysfunction; EF 62-94%.    Acne:  topical cream prescribed by previous PCP.  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   Not taking Aricept currently; thinking about restarting.  No previous neurology evaluation but suggested neurology evaluation if memory worsened.  Occurred after death of mother.  For past two weeks, having head pressure; no true headache only head pressure.    BEE allergy: has EPI pen.  Vitamin D deficiency: maintained on weekly Vitamin D weekly. Requesting labs.  DDD lumbar:  On disability for lower back pain.  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  No pain medication; walks twice daily for 30 minutes each session.  Hematuria: went to bathroom upon arrival; urine was pink.  No vaginal bleeding; no dysuria, urgency, frequency. No fever/chills/sweats. No flank pain.  No n/v.  STD screening: still with ex-husband but not in serious relationship; no vaginal discharge, irritation, pain.    Anemia: chronic history of anemia; s/p first blood transfusion at age 10. Has received three blood transfusions in past.  +contracted Hepatitis C from blood transfusions; s/p recent treatment for Hepatitis C.   Recently moved to [place] from [facility].    Review of Systems  Constitutional: Positive for chills, diaphoresis, appetite change and fatigue. Negative for fever, activity change and unexpected weight change.  HENT: Positive for nosebleeds. Negative for congestion, dental problem, drooling, ear discharge, ear pain, facial swelling, hearing loss, mouth sores, postnasal drip, rhinorrhea, sinus pressure, sneezing, sore throat, tinnitus, trouble swallowing and voice change.   Eyes: Negative.  Negative for photophobia, pain, discharge, redness, itching and visual disturbance.  Respiratory: Negative.  Negative for apnea, cough, choking, chest tightness, shortness of breath, wheezing and stridor.   Cardiovascular: Positive for leg swelling. Negative for chest pain and palpitations.  Gastrointestinal: Positive for diarrhea and constipation. Negative for nausea, vomiting, abdominal pain, blood in stool, abdominal distention, anal bleeding and rectal pain.  Endocrine: Negative.  Negative for cold intolerance, heat intolerance, polydipsia, polyphagia and polyuria.  Genitourinary: Positive for frequency and hematuria. Negative for dysuria, urgency, flank pain, decreased urine volume, vaginal bleeding, vaginal discharge, enuresis, difficulty urinating, genital sores, vaginal pain, menstrual problem, pelvic pain and dyspareunia.  Musculoskeletal: Negative.  Negative  for myalgias, back pain, joint swelling, arthralgias, gait problem, neck pain and neck stiffness.  Skin: Negative.  Negative for color change, pallor, rash and wound.  Allergic/Immunologic: Negative.  Negative for environmental allergies, food allergies and immunocompromised state.  Neurological: Positive for numbness and headaches. Negative for dizziness, tremors, seizures, syncope, facial asymmetry, speech difficulty, weakness and light-headedness.  Hematological: Negative.  Negative for adenopathy. Does not bruise/bleed easily.  Psychiatric/Behavioral: Positive for sleep disturbance. Negative for suicidal ideas, hallucinations, behavioral problems, confusion, self-injury, dysphoric mood, decreased concentration and agitation. The patient is not nervous/anxious and is not hyperactive.    Past Medical History  Diagnosis Date  . HTN (hypertension)   . Hypothyroid   . Hepatitis C 04/17/2013    acquired from blood transfusion; s/p [facility] in [place].  . Family history of heart disease   . Palpitations   . Shortness of breath   . Blood transfusion without reported diagnosis   . Heart murmur   . Anemia 1976, 1984, 2001, 2003    history of multiple transfusions (3); iron deficiency  . Arthritis     hands, knees B; DDD lumbar   Past Surgical History  Procedure Laterality Date  . Liver biopsy     Allergies  Allergen Reactions  . Bee Venom    History   Social History  . Marital Status: Divorced    Spouse Name: N/A  . Number of Children: N/A  . Years of Education: N/A   Occupational History  . Not on file.   Social History Main Topics  . Smoking status: Never Smoker   . Smokeless tobacco: Not on file  . Alcohol Use: Not on file  . Drug Use: Not on file  . Sexual Activity: Not on file   Other Topics Concern  . Not on file   Social History Narrative   Marital status: divorced since 2012 after 54 years of marriage; not dating.  Still with ex-husband.       Children:  3 children; 2 grandchildren.      Lives: with daughter [name])      Employment:  Disability for "all little bit of everything"; chronic anemia, DDD lumbar, memory loss, thyroid.  Previous Research scientist (life sciences).      Tobacco: none      Alcohol: none      Drugs: none      Exercise: walking twice daily for 30 minutes each time.      Sexual activity: same partner/husband.        ADLs: independent with ADLs; drives.         Family History  Problem Relation Age of Onset  . Heart disease Mother     heart attack and stroke; passed away in her 64s  . Diabetes Mother   . Hyperlipidemia Mother   . Hypertension Mother   . Stroke Mother   . Heart disease Father     passed away in his 3s  . Hyperlipidemia Father   . Hypertension Sister   . Diabetes Brother   . Heart disease Brother   . Hypertension Brother   . Diabetes Brother        Objective:   Physical Exam  Constitutional: She is oriented to person, place, and time. She appears well-developed and well-nourished. No distress.  obese  HENT:  Head: Normocephalic and atraumatic.  Right Ear: External ear normal.  Left Ear: External ear normal.  Nose: Nose normal.  Mouth/Throat: Oropharynx is clear and moist.  Eyes: Conjunctivae and EOM are normal. Pupils are equal, round, and reactive to light.  Neck: Normal range of motion and full passive range of motion without pain. Neck supple. No JVD present. Carotid bruit is not present. No thyromegaly present.  Cardiovascular: Normal rate, regular rhythm, normal heart sounds and intact distal pulses.  Exam reveals no gallop and no friction rub.   No murmur heard. Pulmonary/Chest: Effort normal and breath sounds normal. No respiratory distress. She has no wheezes. She has no rales. Right breast exhibits no inverted nipple, no mass, no nipple discharge, no skin change and no tenderness. Left breast exhibits no inverted nipple, no mass, no nipple discharge, no skin change and no tenderness.  Breasts are symmetrical.  Abdominal: Soft. Bowel sounds are normal. She exhibits no distension and no mass. There is no tenderness. There is no rebound and no guarding.  Genitourinary: Vagina normal and uterus normal. There is no rash, tenderness or lesion on the right labia. There is no rash, tenderness or lesion on the left labia. Cervix exhibits no motion tenderness, no discharge and no friability. Right adnexum displays no mass, no tenderness and no fullness. Left adnexum displays no mass, no tenderness and no fullness.  Musculoskeletal:       Right shoulder: Normal.       Left shoulder: Normal.       Cervical back: Normal.  Lymphadenopathy:    She has no cervical adenopathy.  Neurological: She is alert and oriented to person, place, and time. She has normal reflexes. No cranial nerve deficit. She exhibits normal muscle tone. Coordination normal.  Skin: Skin is warm and dry. No rash noted. She is not diaphoretic. No erythema. No pallor.  Psychiatric: She has a normal mood and affect. Her behavior is normal. Judgment and thought content normal.  Nursing note and vitals reviewed.       Assessment & Plan:   1. Encounter for Medicare annual wellness exam   2. Cervical cancer screening   3. Colon cancer screening   4. Essential hypertension, benign   5. Hematuria   6. Memory loss   7. Swelling of both lower extremities   8. High risk sexual behavior   9. Vitamin D deficiency   10. Swelling of limb   11. Palpitations   12. Hypothyroidism due to acquired atrophy of thyroid   13. Essential hypertension   14. Chronic hepatitis C without hepatic coma   15. Acne vulgaris     1. Annual Wellness Examination: anticipatory guidance --- weight loss, exercise, low-fat and low-caloric food choices.  Pap smear obtained.  Refer for mammogram.  Refer for virtual colonoscopy; pt refuses colonoscopy.  Immunizations UTD.  Independent with ADLs. Low fall risk. No hearing loss.  No evidence of  depression.   2.  Gynecological exam: completed; pap smear obtained; refer for mammogram.  3.  Colon cancer screening: refer for virtual colonoscopy.  Strong family history of colon cancer. 4.  HTN: controlled; continue Losartan/HCTZ.  Agreeable to HCTZ 12.5mg  PRN swelling.  Obtain labs, u/a.   5.  Hematuria: New.  By report; u/a negative in office; send urine culture. 6.  Memory loss: Chronic issue for patient with recent worsening; will need to obtain previous records.  Possible disability for memory loss?  Functioning independently at home at this time.  Obtain labs.  Refer to neurology. 7.  Swelling B lower extremities and B upper extremities: New. Obtain labs. Recommend weight loss, salt avoidance/restriction.  Rx for HCTZ 12.5mg  prn  swelling. s/p extensive cardiac work up in past year with echo and event monitor. 8.  High risk sexual behavior: obtain HIV, RPR, GC/Chlam.  9.  Vitamin D deficiency: stable; obtain labs; maintained on weekly Vitamin D 50,000. 10. Palpitations: persistent; using Metoprolol PRN.  S/p cardiology consultation in 09/2013 with Gwenlyn Found; s/p 2D-echo and event monitor. 11. Hypothyroidism: uncontrolled; increase Synthroid slightly; repeat labs in six weeks. 12.  Chronic hepatitis C: stable; s/p Harvoni treatment in 09/2013.  13. Acne vulgaris: stable; maintained on topical treatment. 14. Obesity: recommend weight loss, exercise, low-calorie and low-fat food choices.    Meds ordered this encounter  Medications  . DISCONTD: Adapalene-Benzoyl Peroxide 0.3-2.5 % GEL    Sig: Apply topically.  Marland Kitchen EPINEPHrine 0.3 mg/0.3 mL IJ SOAJ injection    Sig: Inject into the muscle once.  . donepezil (ARICEPT) 5 MG tablet    Sig: Take 5 mg by mouth 2 (two) times daily.  Marland Kitchen DISCONTD: ergocalciferol (VITAMIN D2) 50000 UNITS capsule    Sig: Take 50,000 Units by mouth once a week.  . metoprolol (LOPRESSOR) 50 MG tablet    Sig: Take 50 mg by mouth daily.  . Cholecalciferol 2000 UNITS TABS      Sig: Take by mouth.  . Ledipasvir-Sofosbuvir 90-400 MG TABS    Sig: Take by mouth.  . metoprolol succinate (TOPROL-XL) 50 MG 24 hr tablet    Sig: Take by mouth.  . Adapalene-Benzoyl Peroxide 0.1-2.5 % gel    Sig:   . Vitamin D, Ergocalciferol, (DRISDOL) 50000 UNITS CAPS capsule    Sig: Take by mouth.    Kristi Elayne Guerin, M.D. [facility] [address] [place], [place]  [address] [phone] phone [phone] fax

## 2014-08-03 NOTE — Patient Instructions (Signed)

## 2014-08-04 ENCOUNTER — Other Ambulatory Visit: Payer: Medicare HMO

## 2014-08-04 ENCOUNTER — Other Ambulatory Visit: Payer: Self-pay | Admitting: Emergency Medicine

## 2014-08-04 ENCOUNTER — Encounter: Payer: Self-pay | Admitting: Family Medicine

## 2014-08-04 DIAGNOSIS — B182 Chronic viral hepatitis C: Secondary | ICD-10-CM

## 2014-08-04 DIAGNOSIS — Z9189 Other specified personal risk factors, not elsewhere classified: Secondary | ICD-10-CM | POA: Insufficient documentation

## 2014-08-04 DIAGNOSIS — Z7251 High risk heterosexual behavior: Secondary | ICD-10-CM

## 2014-08-04 DIAGNOSIS — E559 Vitamin D deficiency, unspecified: Secondary | ICD-10-CM

## 2014-08-04 DIAGNOSIS — R319 Hematuria, unspecified: Secondary | ICD-10-CM

## 2014-08-04 DIAGNOSIS — Z0000 Encounter for general adult medical examination without abnormal findings: Secondary | ICD-10-CM

## 2014-08-04 LAB — COMPREHENSIVE METABOLIC PANEL
ALT: 14 U/L (ref 0–35)
AST: 22 U/L (ref 0–37)
Albumin: 4.2 g/dL (ref 3.5–5.2)
Alkaline Phosphatase: 65 U/L (ref 39–117)
BUN: 8 mg/dL (ref 6–23)
CO2: 24 mEq/L (ref 19–32)
Calcium: 9.2 mg/dL (ref 8.4–10.5)
Chloride: 104 mEq/L (ref 96–112)
Creat: 0.69 mg/dL (ref 0.50–1.10)
Glucose, Bld: 88 mg/dL (ref 70–99)
Potassium: 3.8 mEq/L (ref 3.5–5.3)
Sodium: 141 mEq/L (ref 135–145)
Total Bilirubin: 0.5 mg/dL (ref 0.2–1.2)
Total Protein: 7.4 g/dL (ref 6.0–8.3)

## 2014-08-04 LAB — PAP IG (IMAGE GUIDED)

## 2014-08-04 LAB — VITAMIN B12: Vitamin B-12: 678 pg/mL (ref 211–911)

## 2014-08-04 LAB — URINE CULTURE
Colony Count: NO GROWTH
Organism ID, Bacteria: NO GROWTH

## 2014-08-04 MED ORDER — HYDROCHLOROTHIAZIDE 12.5 MG PO TABS: 12.5000 mg | ORAL_TABLET | Freq: Every day | ORAL | Status: DC | PRN

## 2014-08-04 MED ORDER — LEVOTHYROXINE SODIUM 100 MCG PO TABS: 100.0000 ug | ORAL_TABLET | Freq: Every day | ORAL | Status: DC

## 2014-08-05 LAB — VITAMIN D 25 HYDROXY (VIT D DEFICIENCY, FRACTURES): Vit D, 25-Hydroxy: 20 ng/mL — ABNORMAL LOW (ref 30–100)

## 2014-08-05 LAB — HIV ANTIBODY (ROUTINE TESTING W REFLEX): HIV 1&2 Ab, 4th Generation: NONREACTIVE

## 2014-08-05 LAB — SYPHILIS: RPR W/REFLEX TO RPR TITER AND TREPONEMAL ANTIBODIES, TRADITIONAL SCREENING AND DIAGNOSIS ALGORITHM

## 2014-08-10 ENCOUNTER — Telehealth: Payer: Self-pay

## 2014-08-10 NOTE — Telephone Encounter (Signed)
Pt calling about her labs. Please review. Thanks

## 2014-08-11 NOTE — Telephone Encounter (Signed)
Lab results sent to lab pool to contact pt.

## 2014-08-14 ENCOUNTER — Other Ambulatory Visit: Payer: Self-pay

## 2014-08-14 MED ORDER — VITAMIN D (ERGOCALCIFEROL) 1.25 MG (50000 UNIT) PO CAPS: 50000.0000 [IU] | ORAL_CAPSULE | ORAL | Status: DC

## 2014-08-14 MED ORDER — LEVOTHYROXINE SODIUM 100 MCG PO TABS: 100.0000 ug | ORAL_TABLET | Freq: Every day | ORAL | Status: DC

## 2014-08-14 NOTE — Telephone Encounter (Signed)
Notes Recorded by Yvette Rack on 08/11/2014 at 1:48 PM Spoke with pt. Gave pt results Notes Recorded by Wardell Honour, MD on 08/11/2014 at 11:53 AM Call --- 1. Vitamin D level is low at 20. How much vitamin D supplement is she currently taking? 2. No evidence of syphilis or HIV. 3. Vitamin B12 level is normal. 4. Sugar is normal. 5. Liver and kidney functions are normal. 6. Urine culture is negative; no evidence of UTI. 7. Pap smear is normal. 8. No evidence of blood in s  There was a warning message on the vitamin D. Please advise.

## 2014-08-14 NOTE — Telephone Encounter (Signed)
Meds ordered this encounter  Medications  . levothyroxine (SYNTHROID, LEVOTHROID) 100 MCG tablet    Sig: Take 1 tablet (100 mcg total) by mouth daily.    Dispense:  90 tablet    Refill:  0    Brand name only.  . Vitamin D, Ergocalciferol, (DRISDOL) 50000 UNITS CAPS capsule    Sig: Take 1 capsule (50,000 Units total) by mouth every 7 (seven) days.    Dispense:  12 capsule    Refill:  0

## 2014-08-14 NOTE — Telephone Encounter (Signed)
Patient needs Synthroid (brand name) 90 day supply and vitamin D2 1.25 mg 50,000 unites sent to [facility] on [address]. Patient has been waiting for her medications and calling the pharmacy to see if we sent the prescriptions since April 18th  and also states she is feeling terrible by now.

## 2014-08-17 MED ORDER — LEVOTHYROXINE SODIUM 100 MCG PO TABS: 100.0000 ug | ORAL_TABLET | Freq: Every day | ORAL | Status: DC

## 2014-08-17 NOTE — Telephone Encounter (Signed)
No changes at this time.

## 2014-08-21 ENCOUNTER — Ambulatory Visit (INDEPENDENT_AMBULATORY_CARE_PROVIDER_SITE_OTHER): Payer: Medicare HMO

## 2014-08-21 ENCOUNTER — Ambulatory Visit (INDEPENDENT_AMBULATORY_CARE_PROVIDER_SITE_OTHER): Payer: Medicare HMO | Admitting: Family Medicine

## 2014-08-21 VITALS — BP 122/75 | HR 89 | Temp 98.1°F | Resp 16 | Ht 66.0 in | Wt 211.0 lb

## 2014-08-21 DIAGNOSIS — R413 Other amnesia: Secondary | ICD-10-CM | POA: Diagnosis not present

## 2014-08-21 DIAGNOSIS — E038 Other specified hypothyroidism: Secondary | ICD-10-CM | POA: Diagnosis not present

## 2014-08-21 DIAGNOSIS — M25562 Pain in left knee: Secondary | ICD-10-CM

## 2014-08-21 DIAGNOSIS — M25561 Pain in right knee: Secondary | ICD-10-CM | POA: Diagnosis not present

## 2014-08-21 HISTORY — DX: Other amnesia: R41.3

## 2014-08-21 MED ORDER — MELOXICAM 15 MG PO TABS: 15.0000 mg | ORAL_TABLET | Freq: Every day | ORAL | Status: DC

## 2014-08-21 MED ORDER — LEVOTHYROXINE SODIUM 100 MCG PO TABS: 100.0000 ug | ORAL_TABLET | Freq: Every day | ORAL | Status: DC

## 2014-08-21 MED ORDER — DONEPEZIL HCL 5 MG PO TABS: 5.0000 mg | ORAL_TABLET | Freq: Two times a day (BID) | ORAL | Status: DC

## 2014-08-21 NOTE — Progress Notes (Signed)
[facility] [address] [place] [address] [phone]  Date:  08/21/2014   Name:  Robyn Montes   DOB:  04/29/1955   MRN:  [id number]  PCP:  Marcelina Morel    Chief Complaint: Leg Swelling   History of Present Illness:  Robyn Montes is a 59 y.o. very pleasant female patient who presents with the following:  Complaint of swelling in her knees.  In the past she had to be on some sort of medication to stop her menses (due to severe anemia) and was "told it could cause bone loss."  Unsure if this could have damaged her joints.  She is here today with right and left knee pain. She has had trouble with her knees for about 10 years since she started the medication above- she is not sure what med this was.   She feels like maybe her bilateral kneecaps may sublux at times and then go back in.  Her knees will also swell and hurt for days at a time.  Her knees are always questionable but her sx will wax and wane.   She likes to walk for exercise but has not been able to do so over the last 3-4 days. Her knees have felt unstable Her knee will feel unstable at times, and it may click and pop a lot.  It may also lock up.   She has never an MRI on her knee, never had any knee surgery.   No known injury to cause her acute sx  She has also been on aricept for her dementia in the past.  She would like to go back on this again as it did seem to be helping her  Patient Active Problem List   Diagnosis Date Noted  . Chronic hepatitis C 08/04/2014  . History of other specified conditions presenting hazards to health 08/04/2014  . Palpitations 10/03/2013  . Essential hypertension 10/03/2013  . Biliary calculi 01/17/2013    Past Medical History  Diagnosis Date  . HTN (hypertension)   . Hypothyroid   . Hepatitis C 04/17/2013    acquired from blood transfusion; s/p [facility] in [place].  . Family history of heart disease   . Palpitations   .  Shortness of breath   . Blood transfusion without reported diagnosis   . Heart murmur   . Anemia 1976, 1984, 2001, 2003    history of multiple transfusions (3); iron deficiency  . Arthritis     hands, knees B; DDD lumbar    Past Surgical History  Procedure Laterality Date  . Liver biopsy      History  Substance Use Topics  . Smoking status: Never Smoker   . Smokeless tobacco: Not on file  . Alcohol Use: Not on file    Family History  Problem Relation Age of Onset  . Heart disease Mother     heart attack and stroke; passed away in her 25s  . Diabetes Mother   . Hyperlipidemia Mother   . Hypertension Mother   . Stroke Mother   . Heart disease Father     passed away in his 15s  . Hyperlipidemia Father   . Hypertension Sister   . Diabetes Brother   . Heart disease Brother   . Hypertension Brother   . Diabetes Brother     Allergies  Allergen Reactions  . Bee Venom     Medication list has been reviewed and updated.  Current  Outpatient Prescriptions on File Prior to Visit  Medication Sig Dispense Refill  . donepezil (ARICEPT) 5 MG tablet Take 5 mg by mouth 2 (two) times daily.    Marland Kitchen EPINEPHrine 0.3 mg/0.3 mL IJ SOAJ injection Inject into the muscle once.    Marland Kitchen levothyroxine (SYNTHROID, LEVOTHROID) 100 MCG tablet Take 1 tablet (100 mcg total) by mouth daily. 90 tablet 1  . losartan-hydrochlorothiazide (HYZAAR) 50-12.5 MG per tablet Take 1 tablet by mouth daily.    . metoprolol (LOPRESSOR) 50 MG tablet Take 50 mg by mouth daily.    . metoprolol succinate (TOPROL-XL) 50 MG 24 hr tablet Take by mouth.    . Tetrahydroz-Glyc-Hyprom-PEG (VISINE MAXIMUM REDNESS RELIEF) 0.05-0.2-0.36-1 % SOLN Apply 2-3 drops to eye daily as needed (for eye redness).    . Vitamin D, Ergocalciferol, (DRISDOL) 50000 UNITS CAPS capsule Take 1 capsule (50,000 Units total) by mouth every 7 (seven) days. 12 capsule 0  . Adapalene-Benzoyl Peroxide 0.1-2.5 % gel     . Cholecalciferol 2000 UNITS TABS  Take by mouth.    . hydrochlorothiazide (HYDRODIURIL) 12.5 MG tablet Take 1 tablet (12.5 mg total) by mouth daily as needed. (Patient not taking: Reported on 08/21/2014) 30 tablet 3  . Ledipasvir-Sofosbuvir 90-400 MG TABS Take by mouth.     No current facility-administered medications on file prior to visit.    Review of Systems:  As per HPI- otherwise negative.  BP Readings from Last 3 Encounters:  08/21/14 100/60  08/03/14 128/74  12/08/13 137/82      Physical Examination: Filed Vitals:   08/21/14 1645  BP: 100/60  Pulse: 89  Temp: 98.1 F (36.7 C)  Resp: 16   Filed Vitals:   08/21/14 1645  Height: 5\' 6"  (1.676 m)  Weight: 211 lb (95.709 kg)   Body mass index is 34.07 kg/(m^2). Ideal Body Weight: Weight in (lb) to have BMI = 25: 154.6  GEN: WDWN, NAD, Non-toxic, A & O x 3, obese, looks well HEENT: Atraumatic, Normocephalic. Neck supple. No masses, No LAD. Ears and Nose: No external deformity. CV: RRR, No M/G/R. No JVD. No thrill. No extra heart sounds. PULM: CTA B, no wheezes, crackles, rhonchi. No retractions. No resp. distress. No accessory muscle use. EXTR: No c/c/e NEURO Normal gait.  PSYCH: Normally interactive. Conversant. Not depressed or anxious appearing.  Calm demeanor.  right knee: crepitus, joint effusion, pain at medial joint line and under patella. No redness or heat Left knee: crepitus. Minimal to no effusion, medial joint line tenderness   [facility] reading (PRIMARY) by  Dr. Lorelei Pont. Right knee: moderate subpatellar degenerative change Left knee: mild degenerative change  RIGHT KNEE 3 VIEWS  COMPARISON: None.  FINDINGS: No acute fracture or dislocation is identified. A small knee joint effusion is questioned. There is mild tricompartmental joint space narrowing and mild-to-moderate osteophytosis. No lytic or blastic osseous lesion or soft tissue abnormality is seen.  IMPRESSION: 1. 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 Possible  small knee joint effusion.  LEFT KNEE 3 VIEWS  COMPARISON: None.  FINDINGS: No fracture. No bone lesion.  There are mild degenerative changes with tricompartmental marginal osteophyte formation without significant joint space narrowing.  No convincing joint effusion. The soft tissues are unremarkable.  IMPRESSION: 1. No fracture or acute finding. 2. Mild osteoarthritis.  Placed in a hinged knee brace which did feel good to her  She does not have any metal in her body. No pacemaker.   Assessment and Plan: Knee pain, bilateral - Plan: DG  Knee AP/LAT W/Sunrise Right, DG Knee AP/LAT W/Sunrise Left, meloxicam (MOBIC) 15 MG tablet, MR Knee Right Wo Contrast, CANCELED: DG Knee Complete 4 Views Right, CANCELED: DG Knee Complete 4 Views Left  Other specified hypothyroidism - Plan: levothyroxine (SYNTHROID, LEVOTHROID) 100 MCG tablet  Memory impairment - Plan: donepezil (ARICEPT) 5 MG tablet  Memory loss  She would like to move forward with an MRI to determine the cause of her knee pain.  Placed in a hinged knee brace, mobic as needed.  Order MRI Refilled her synthroid She would like to restart aricept- go back on 5mg  for one month, then increase to 10mg  daily. See patient instructions for more details.    Meds ordered this encounter  Medications  . meloxicam (MOBIC) 15 MG tablet    Sig: Take 1 tablet (15 mg total) by mouth daily. Use as needed for knee pain    Dispense:  30 tablet    Refill:  0  . levothyroxine (SYNTHROID, LEVOTHROID) 100 MCG tablet    Sig: Take 1 tablet (100 mcg total) by mouth daily.    Dispense:  90 tablet    Refill:  2    Brand name only.  . donepezil (ARICEPT) 5 MG tablet    Sig: Take 1 tablet (5 mg total) by mouth 2 (two) times daily. Start with once a day for 1 month    Dispense:  60 tablet    Refill:  3       Signed Lamar Blinks, MD

## 2014-08-21 NOTE — Patient Instructions (Signed)
We will refer you for an MRI of your knee Use the knee brace when you can Ice and elevate your knee Use the mobic once a day as needed for your knee pain.   Keep an eye on your blood pressure when you are using the mobic- let me know if it is getting high.

## 2014-08-30 ENCOUNTER — Ambulatory Visit
Admission: RE | Admit: 2014-08-30 | Discharge: 2014-08-30 | Disposition: A | Discharge status: Home / Self Care | Payer: Medicare PPO | Source: Ambulatory Visit | Attending: Family Medicine | Admitting: Family Medicine

## 2014-08-30 DIAGNOSIS — M25561 Pain in right knee: Secondary | ICD-10-CM

## 2014-08-30 DIAGNOSIS — M25562 Pain in left knee: Principal | ICD-10-CM

## 2014-08-31 ENCOUNTER — Telehealth: Payer: Self-pay | Admitting: Family Medicine

## 2014-08-31 DIAGNOSIS — N6452 Nipple discharge: Secondary | ICD-10-CM

## 2014-08-31 DIAGNOSIS — M25561 Pain in right knee: Secondary | ICD-10-CM

## 2014-08-31 NOTE — Telephone Encounter (Addendum)
Called her to go over her MRI report- she does have a meniscal tear. Will refer to ortho for her She also states that she needs a referral for a "special mammogram with an ultrasound" due to a history of black discharge from her bilateral nipples that has been present for years.  She had not mentioned this to me at our recent visit, and has not had any prior mammograms done in [place].  States she has tried but been unable to get her old mammogram reports from her prior facility.  I will place a referral for a diagnostic mammogram but let her know that I may not have all the necessary information to make decisions about this issue for her as she does not have any records and did not mention this at our visit. She has not had a mammo in 3 years and was told to get one annually  MRI RIGHT knee IMPRESSION: 1. Horizontal tear of the posterior horn of the medial meniscus. 2. Osteoarthritis of the medial compartment with marked joint space narrowing with secondary peripheral subluxation of the degenerated meniscus. 3. Joint effusion. 4. Extensive chondromalacia of the patella.

## 2014-09-03 ENCOUNTER — Other Ambulatory Visit: Payer: Self-pay | Admitting: Family Medicine

## 2014-09-03 DIAGNOSIS — N6452 Nipple discharge: Secondary | ICD-10-CM

## 2014-09-17 ENCOUNTER — Other Ambulatory Visit: Payer: Self-pay | Admitting: Family Medicine

## 2014-09-17 ENCOUNTER — Ambulatory Visit
Admission: RE | Admit: 2014-09-17 | Discharge: 2014-09-17 | Disposition: A | Discharge status: Home / Self Care | Payer: Medicare PPO | Source: Ambulatory Visit | Attending: Family Medicine | Admitting: Family Medicine

## 2014-09-17 DIAGNOSIS — Z1211 Encounter for screening for malignant neoplasm of colon: Secondary | ICD-10-CM

## 2014-09-17 NOTE — Telephone Encounter (Signed)
Dr Tamala Julian, at 08/03/14 OV you inst'd pt to cont losartan HCTZ, but I don't see that we have ever Rxd it. Do you want to?

## 2014-09-22 ENCOUNTER — Telehealth: Payer: Self-pay

## 2014-09-22 NOTE — Telephone Encounter (Signed)
Pt notified that xray is ready for p/u. Pt wants to know what her colonoscopy showed. Please review. Thanks

## 2014-09-22 NOTE — Telephone Encounter (Signed)
Please let her know that her colonoscopy looked ok.  Dr. Tamala Julian actually ordered this and may want to go over more details with her Dr. Tamala Julian it does look like she also had some gallstones seen on this scan

## 2014-09-22 NOTE — Telephone Encounter (Signed)
Pt is having an appt with [facility] on Wednesday and need copies of xray of her knees and would like to have a call to pick them up once ready today

## 2014-09-22 NOTE — Telephone Encounter (Signed)
Pt.notified

## 2014-09-25 ENCOUNTER — Ambulatory Visit
Admission: RE | Admit: 2014-09-25 | Discharge: 2014-09-25 | Disposition: A | Discharge status: Home / Self Care | Payer: Medicare PPO | Source: Ambulatory Visit | Attending: Family Medicine | Admitting: Family Medicine

## 2014-09-25 DIAGNOSIS — N6452 Nipple discharge: Secondary | ICD-10-CM

## 2014-10-01 ENCOUNTER — Ambulatory Visit (INDEPENDENT_AMBULATORY_CARE_PROVIDER_SITE_OTHER): Payer: Medicare HMO | Admitting: Physician Assistant

## 2014-10-01 VITALS — BP 118/62 | HR 75 | Temp 98.9°F | Resp 16 | Ht 65.5 in | Wt 213.4 lb

## 2014-10-01 DIAGNOSIS — E039 Hypothyroidism, unspecified: Secondary | ICD-10-CM

## 2014-10-01 DIAGNOSIS — R251 Tremor, unspecified: Secondary | ICD-10-CM | POA: Diagnosis not present

## 2014-10-01 DIAGNOSIS — E034 Atrophy of thyroid (acquired): Secondary | ICD-10-CM

## 2014-10-01 DIAGNOSIS — E038 Other specified hypothyroidism: Secondary | ICD-10-CM

## 2014-10-01 DIAGNOSIS — Z7983 Long term (current) use of bisphosphonates: Secondary | ICD-10-CM | POA: Insufficient documentation

## 2014-10-01 LAB — GLUCOSE, POCT (MANUAL RESULT ENTRY): POC Glucose: 95 mg/dl (ref 70–99)

## 2014-10-01 NOTE — Progress Notes (Signed)
Subjective:    Patient ID: Robyn Montes, female    DOB: 1956/02/08, 59 y.o.   MRN: [id number]  Chief Complaint  Patient presents with  . Tremors    Started yesterday, progressively worsened  . Labs Only    Thyroid check, liver function, pt wants synthroid dosage changed (NAME BRAND ONLY) per pt   Patient Active Problem List   Diagnosis Date Noted  . Personal history of ongoing treatment with alendronate (Fosamax) 10/01/2014  . Hypothyroidism 10/01/2014  . Memory loss 08/21/2014  . Chronic hepatitis C 08/04/2014  . History of other specified conditions presenting hazards to health 08/04/2014  . Palpitations 10/03/2013  . Essential hypertension 10/03/2013  . Biliary calculi 01/17/2013   Medications, allergies, past medical history, surgical history, family history, social history and problem list reviewed and updated.  HPI  59 yof with pmh hypothyroidism presents wanting thyroid checked, liver checked, and with tremors left UE.   Hx hypothyroidism - been on synthroid for yrs. Saw Dr Robyn Julian for cpe on 08/03/14, TSH/T4 had been drawn few wks prior to that. TSH was slightly elevated at 4.808 but free t4 normal. Was instructed to increase synthroid from 75 to 100 mcg qd. Today pt states she actually started taking 2 of her 75 mcg tabs daily. She jumped from 75 to 150 qd. She states she felt well this but occasionally had some intermittent palps. She ran out of 75 mcg tabs few days ago so filled her 100 mcg pills and started taking this once daily.   She denies heat/cold intolerance. She has been gaining wt recently despite exercising and eating right. Has not had palps past week or so.   Also mentions tremors. New for her. Started 3 days ago when she decreased from 150 to 100 mcg qd. Started with left pinky tremors both at rest and intention. This resolved after day. Then this am tremors left pinky started again and eventually was entire left hand. Again both at rest and with intention.  Resolved few hrs ago.   Has hx Hep C from blood transfusion per pt. Tx per pt. She mentions today she's worried her liver might be contributing to tremors. Had normal cmp 2 months ago.   Review of Systems No fevers, chills.     Objective:   Physical Exam  Constitutional: She is oriented to person, place, and time. She appears well-developed and well-nourished.  Non-toxic appearance. She does not have a sickly appearance. She does not appear ill. No distress.  BP 118/62 mmHg  Pulse 75  Temp(Src) 98.9 F (37.2 C) (Oral)  Resp 16  Ht 5' 5.5" (1.664 m)  Wt 213 lb 6.4 oz (96.798 kg)  BMI 34.96 kg/m2  SpO2 97%   Neck: No thyroid mass and no thyromegaly present.  Neurological: She is alert and oriented to person, place, and time. She has normal strength. No cranial nerve deficit or sensory deficit. She displays a negative Romberg sign.  No rest, action, or intention tremor noted.   Psychiatric: She has a normal mood and affect. Her speech is normal and behavior is normal.   Results for orders placed or performed in visit on 10/01/14  POCT glucose (manual entry)  Result Value Ref Range   POC Glucose 95 70 - 99 mg/dl      Assessment & Plan:   59 yof with pmh hypothyroidism presents wanting thyroid checked, liver checked, and with tremors left UE.   Hypothyroidism due to acquired atrophy of thyroid - Plan:  TSH, T4, Free Tremor - Plan: Comprehensive metabolic panel, POCT glucose (manual entry), Ambulatory referral to Neurology --checking tsh/free t4 today though results may not be accurate with recent syntrhoid dose change --future orders placed to rtc 6 wks for tsh/free t4 --cmp today, bg normal today --referral to neuro for new onset tremors, no appreciable tremors in clinic today   Robyn Gutting, PA-C Physician Assistant-Certified [facility]  10/01/2014 4:52 PM

## 2014-10-01 NOTE — Patient Instructions (Signed)
We have checked your thyroid levels today but doubt these will be very beneficial. Let's plan to have you come back in 6 weeks to repeat your thyroid check.  Your blood sugar was normal today.  We are referring you to neurology for work up to find the cause of your new onset tremors.

## 2014-10-02 ENCOUNTER — Encounter: Payer: Self-pay | Admitting: Family Medicine

## 2014-10-02 ENCOUNTER — Other Ambulatory Visit: Payer: Self-pay | Admitting: Family Medicine

## 2014-10-02 LAB — COMPREHENSIVE METABOLIC PANEL
ALT: 13 U/L (ref 0–35)
AST: 18 U/L (ref 0–37)
Albumin: 4 g/dL (ref 3.5–5.2)
Alkaline Phosphatase: 68 U/L (ref 39–117)
BUN: 12 mg/dL (ref 6–23)
CO2: 29 mEq/L (ref 19–32)
Calcium: 9.2 mg/dL (ref 8.4–10.5)
Chloride: 102 mEq/L (ref 96–112)
Creat: 0.69 mg/dL (ref 0.50–1.10)
Glucose, Bld: 87 mg/dL (ref 70–99)
Potassium: 4.1 mEq/L (ref 3.5–5.3)
Sodium: 140 mEq/L (ref 135–145)
Total Bilirubin: 0.3 mg/dL (ref 0.2–1.2)
Total Protein: 7.4 g/dL (ref 6.0–8.3)

## 2014-10-02 LAB — T4, FREE: Free T4: 1.25 ng/dL (ref 0.80–1.80)

## 2014-10-02 LAB — TSH: TSH: 0.77 u[IU]/mL (ref 0.350–4.500)

## 2014-10-02 MED ORDER — LEVOTHYROXINE SODIUM 100 MCG PO TABS: 100.0000 ug | ORAL_TABLET | Freq: Every day | ORAL | Status: DC

## 2014-10-02 NOTE — Addendum Note (Signed)
Addended byJulieta Gutting on: 10/02/2014 02:05 PM   Modules accepted: Orders

## 2014-10-02 NOTE — Progress Notes (Signed)
  Medical screening examination/treatment/procedure(s) were performed by non-physician practitioner and as supervising physician I was immediately available for consultation/collaboration.     

## 2014-10-08 ENCOUNTER — Ambulatory Visit: Payer: Medicare HMO | Admitting: Neurology

## 2014-10-08 ENCOUNTER — Telehealth: Payer: Self-pay | Admitting: *Deleted

## 2014-10-08 NOTE — Telephone Encounter (Signed)
No showed new patient appointment. 

## 2014-10-09 ENCOUNTER — Encounter: Payer: Self-pay | Admitting: Neurology

## 2014-10-29 ENCOUNTER — Other Ambulatory Visit: Payer: Self-pay | Admitting: Physician Assistant

## 2014-11-04 ENCOUNTER — Ambulatory Visit: Payer: Medicare HMO | Admitting: Family Medicine

## 2014-12-09 ENCOUNTER — Other Ambulatory Visit: Payer: Self-pay | Admitting: Family Medicine

## 2014-12-10 NOTE — Telephone Encounter (Signed)
Dr Tamala Julian, we have not Rxd EPI pen for pt before, but you mentioned it and Bee allergy on 08/03/14 wellness visit. Do you want to Rx?

## 2014-12-22 ENCOUNTER — Ambulatory Visit (INDEPENDENT_AMBULATORY_CARE_PROVIDER_SITE_OTHER): Payer: Medicare HMO | Admitting: Family Medicine

## 2014-12-22 VITALS — BP 102/60 | HR 76 | Temp 98.3°F | Resp 14 | Ht 65.5 in | Wt 215.0 lb

## 2014-12-22 DIAGNOSIS — R229 Localized swelling, mass and lump, unspecified: Secondary | ICD-10-CM

## 2014-12-22 DIAGNOSIS — R7309 Other abnormal glucose: Secondary | ICD-10-CM

## 2014-12-22 DIAGNOSIS — R7303 Prediabetes: Secondary | ICD-10-CM

## 2014-12-22 DIAGNOSIS — Z131 Encounter for screening for diabetes mellitus: Secondary | ICD-10-CM | POA: Diagnosis not present

## 2014-12-22 DIAGNOSIS — E669 Obesity, unspecified: Secondary | ICD-10-CM

## 2014-12-22 DIAGNOSIS — M546 Pain in thoracic spine: Secondary | ICD-10-CM

## 2014-12-22 DIAGNOSIS — M549 Dorsalgia, unspecified: Secondary | ICD-10-CM

## 2014-12-22 DIAGNOSIS — R222 Localized swelling, mass and lump, trunk: Secondary | ICD-10-CM

## 2014-12-22 LAB — POCT GLYCOSYLATED HEMOGLOBIN (HGB A1C): Hemoglobin A1C: 5.8

## 2014-12-22 MED ORDER — METFORMIN HCL 500 MG PO TABS: 500.0000 mg | ORAL_TABLET | Freq: Two times a day (BID) | ORAL | Status: DC

## 2014-12-22 NOTE — Patient Instructions (Addendum)
Your mammogram looked ok- recommended repeat in one year.   We will start you on metformin for pre-diabetes Take it once a day, and then twice a day after 1-2 weeks. This will help to prevent onset of diabetes and can also be helpful for weight loss I will get you set up to see a nutritionist to discuss diet We will do an ultrasound of the area on your right back, and I will refer you to physical therapy to work on your back  Let's recheck in 3-4 months

## 2014-12-22 NOTE — Progress Notes (Signed)
[facility] [address] [place] [address] [phone]  Date:  12/22/2014   Name:  Robyn Montes   DOB:  1955-12-27   MRN:  [id number]  PCP:  Marcelina Morel    Chief Complaint: Follow-up   History of Present Illness:  Robyn Montes is a 59 y.o. very pleasant female patient who presents with the following:  History of Hep C s/p treatment Here today seeking discussion of her recent mammogram- she was not sure if it was ok or not- reassured her that it looks ok   RECOMMENDATION: 1. The patient has been instructed to seek further care if she experiences unilateral spontaneous clear or bloody nipple discharge.  2. Screening mammogram in one year.(Code:[id number]-B-01Y)  I have discussed the findings and recommendations with the patient. Results were also provided in writing at the conclusion of the visit. If applicable, a reminder letter will be sent to the patient regarding the next appointment.  BI-RADS CATEGORY 2: Benign.  Normal TSH in June  She states that she is walking "an hour a day," and is trying to watch her diet but continues to gain weight.  Would like to try some sort of medication for this She weighed 195 about a year ago  Wt Readings from Last 3 Encounters:  12/22/14 215 lb (97.523 kg)  10/01/14 213 lb 6.4 oz (96.798 kg)  08/21/14 211 lb (95.709 kg)   She also notes that she tends to have some pain in her upper back- wonders if PT might be helpful in dealing with these sx   Patient Active Problem List   Diagnosis Date Noted  . Personal history of ongoing treatment with alendronate (Fosamax) 10/01/2014  . Hypothyroidism 10/01/2014  . Memory loss 08/21/2014  . Chronic hepatitis C 08/04/2014  . History of other specified conditions presenting hazards to health 08/04/2014  . Palpitations 10/03/2013  . Essential hypertension 10/03/2013  . Biliary calculi 01/17/2013    Past Medical History  Diagnosis Date  . HTN  (hypertension)   . Hypothyroid   . Hepatitis C 04/17/2013    acquired from blood transfusion; s/p [facility] treatment [facility] in [place].  . Family history of heart disease   . Palpitations   . Shortness of breath   . Blood transfusion without reported diagnosis   . Heart murmur   . Anemia 1976, 1984, 2001, 2003    history of multiple transfusions (3); iron deficiency  . Arthritis     hands, knees B; DDD lumbar  . Memory loss 08/21/2014    Past Surgical History  Procedure Laterality Date  . Liver biopsy      Social History  Substance Use Topics  . Smoking status: Never Smoker   . Smokeless tobacco: None  . Alcohol Use: None    Family History  Problem Relation Age of Onset  . Heart disease Mother     heart attack and stroke; passed away in her 66s  . Diabetes Mother   . Hyperlipidemia Mother   . Hypertension Mother   . Stroke Mother   . Heart disease Father     passed away in his 65s  . Hyperlipidemia Father   . Hypertension Sister   . Diabetes Brother   . Heart disease Brother   . Hypertension Brother   . Diabetes Brother     Allergies  Allergen Reactions  . Bee Venom     Medication list has been reviewed and updated.  Current Outpatient Prescriptions  on File Prior to Visit  Medication Sig Dispense Refill  . Cholecalciferol 2000 UNITS TABS Take by mouth.    . donepezil (ARICEPT) 5 MG tablet Take 1 tablet (5 mg total) by mouth 2 (two) times daily. Start with once a day for 1 month 60 tablet 3  . EPIPEN 2-PAK 0.3 MG/0.3ML SOAJ injection USE AS DIRECTED 2 Device 1  . levothyroxine (SYNTHROID, LEVOTHROID) 100 MCG tablet Take 1 tablet (100 mcg total) by mouth daily. 90 tablet 2  . losartan-hydrochlorothiazide (HYZAAR) 50-12.5 MG per tablet Take 1 tablet by mouth daily. 90 tablet 1  . meloxicam (MOBIC) 15 MG tablet TAKE 1 TABLET BY MOUTH DAILY. USE AS NEEDED FOR KNEE PAIN 30 tablet 0  . metoprolol succinate (TOPROL-XL) 50 MG 24 hr tablet TAKE 1 TABLET (50 MG  TOTAL) BY MOUTH ONCE DAILY. 90 tablet 0  . Tetrahydroz-Glyc-Hyprom-PEG (VISINE MAXIMUM REDNESS RELIEF) 0.05-0.2-0.36-1 % SOLN Apply 2-3 drops to eye daily as needed (for eye redness).    . Vitamin D, Ergocalciferol, (DRISDOL) 50000 UNITS CAPS capsule TAKE 1 CAPSULE BY MOUTH EVERY 7 DAYS. 12 capsule 2   No current facility-administered medications on file prior to visit.    Review of Systems: As per HPI- otherwise negative.   Physical Examination: Filed Vitals:   12/22/14 1247  BP: 102/60  Pulse: 76  Temp: 98.3 F (36.8 C)  Resp: 14   Filed Vitals:   12/22/14 1247  Height: 5' 5.5" (1.664 m)  Weight: 215 lb (97.523 kg)   Body mass index is 35.22 kg/(m^2). Ideal Body Weight: Weight in (lb) to have BMI = 25: 152.2  GEN: WDWN, NAD, Non-toxic, A & O x 3, obese, look swell HEENT: Atraumatic, Normocephalic. Neck supple. No masses, No LAD. Ears and Nose: No external deformity. CV: RRR, No M/G/R. No JVD. No thrill. No extra heart sounds. PULM: CTA B, no wheezes, crackles, rhonchi. No retractions. No resp. distress. No accessory muscle use. EXTR: No c/c/e NEURO Normal gait.  PSYCH: Normally interactive. Conversant. Not depressed or anxious appearing.  Calm demeanor.  She has a soft mass on the right side of her upper back- ? Lipoma Never been evaluated in the past  She has tight, tender muscles in the left upper back Normal BUE strength   Results for orders placed or performed in visit on 12/22/14  POCT glycosylated hemoglobin (Hb A1C)  Result Value Ref Range   Hemoglobin A1C 5.8     Assessment and Plan: Pre-diabetes - Plan: metFORMIN (GLUCOPHAGE) 500 MG tablet  Screening for diabetes mellitus - Plan: POCT glycosylated hemoglobin (Hb A1C)  Obesity - Plan: Amb ref to Medical Nutrition Therapy-MNT  Mass on back - Plan: [place] Misc Soft Tissue  Upper back pain - Plan: Ambulatory referral to Physical Therapy  Referral to nutritionist to discuss diet Start on metformin for  prevention of DM 2, and to assist with weight loss PT for her back [place] for likely lipoma on her back Recheck 3-4 months   Signed Lamar Blinks, MD

## 2014-12-29 ENCOUNTER — Encounter: Payer: Self-pay | Admitting: Family Medicine

## 2014-12-29 ENCOUNTER — Ambulatory Visit
Admission: RE | Admit: 2014-12-29 | Discharge: 2014-12-29 | Disposition: A | Discharge status: Home / Self Care | Payer: Medicare HMO | Source: Ambulatory Visit | Attending: Family Medicine | Admitting: Family Medicine

## 2014-12-29 DIAGNOSIS — R222 Localized swelling, mass and lump, trunk: Secondary | ICD-10-CM

## 2014-12-31 ENCOUNTER — Telehealth: Payer: Self-pay | Admitting: Radiology

## 2014-12-31 NOTE — Telephone Encounter (Signed)
-----   Message from Ebony Hail sent at 12/31/2014  4:23 PM EDT ----- Regarding: [place] results Contact: [phone] The patient called about her [place] results, I did not see any notes from you, and I told her that as soon as I have the report I will call her. Please let me know what to tell her , or you want to talk to her. Thanks, RodiI

## 2014-12-31 NOTE — Telephone Encounter (Signed)
Called pt- there is a letter in the mail to her, benign lipoma.

## 2014-12-31 NOTE — Telephone Encounter (Signed)
The patient called about her [place] she had on 12/29/14.

## 2015-01-11 ENCOUNTER — Ambulatory Visit: Payer: Medicare PPO | Admitting: Physical Therapy

## 2015-01-20 ENCOUNTER — Ambulatory Visit: Payer: Medicaid Other | Admitting: Physical Therapy

## 2015-01-25 ENCOUNTER — Ambulatory Visit: Payer: Medicare HMO | Admitting: Physical Therapy

## 2015-03-16 ENCOUNTER — Telehealth: Payer: Self-pay

## 2015-03-16 NOTE — Telephone Encounter (Signed)
PA was received for Aricept (name brand only). I don't have any notes as to if pt needs the name brand. Called pharm who checked and stated the last RF given in June was generic, but they had a Rx sent in on 8/29 for name brand from Dr Dory Larsen. I asked pharm to send the PA to the Dr who had most recently Rx'd it and should have info. Pharm Agreed.

## 2015-03-24 ENCOUNTER — Ambulatory Visit: Payer: Medicare HMO | Admitting: Dietician

## 2015-04-02 ENCOUNTER — Telehealth: Payer: Self-pay

## 2015-04-02 ENCOUNTER — Ambulatory Visit (INDEPENDENT_AMBULATORY_CARE_PROVIDER_SITE_OTHER): Payer: Medicare HMO | Admitting: Family Medicine

## 2015-04-02 VITALS — BP 112/68 | HR 90 | Temp 98.8°F | Resp 16 | Wt 217.0 lb

## 2015-04-02 DIAGNOSIS — Z131 Encounter for screening for diabetes mellitus: Secondary | ICD-10-CM | POA: Diagnosis not present

## 2015-04-02 DIAGNOSIS — E034 Atrophy of thyroid (acquired): Secondary | ICD-10-CM | POA: Diagnosis not present

## 2015-04-02 DIAGNOSIS — I10 Essential (primary) hypertension: Secondary | ICD-10-CM | POA: Diagnosis not present

## 2015-04-02 DIAGNOSIS — E038 Other specified hypothyroidism: Secondary | ICD-10-CM

## 2015-04-02 MED ORDER — LOSARTAN POTASSIUM-HCTZ 50-12.5 MG PO TABS: 1.0000 | ORAL_TABLET | Freq: Two times a day (BID) | ORAL | Status: DC

## 2015-04-02 NOTE — Telephone Encounter (Signed)
She states she was taking one a day with her Metoprolol but she is not taking the Metoprolol at all and feels she needs to take two Losartan/HCT. (Pt did this on her own without medical advice) She may need to come in to discuss with you.

## 2015-04-02 NOTE — Telephone Encounter (Signed)
Patient is calling to request a refill for losartan for twice daily. Patent states that the last time she requested twice daily we sent it for once. She wants [place] to make sure that it's twice. Pharmacy is [facility] on [address]

## 2015-04-02 NOTE — Telephone Encounter (Signed)
Wardell Honour, MD at 08/03/2014 2:32 PM   HTN: Patient reports good compliance with medication, good tolerance to medication, and good symptom control.  Losartan-HCTZ 50-12.5 one daily. Also has Metoprolol for PRN use. Chronic palpitations; Metoprolol prescribed PRN for palpitations. Suffering with worsening swelling in past several weeks; requesting additional diuretic.    I do not see any notes on changing dose. Please advise.

## 2015-04-02 NOTE — Telephone Encounter (Signed)
Call for clarification --- has patient been taking Losartan twice daily for the past six months or has she been taking it once daily for the past six months?

## 2015-04-02 NOTE — Progress Notes (Addendum)
Urgent Medical and [facility] [address], [place] [address] [phone]  Date:  04/02/2015   Name:  Robyn Montes   DOB:  1955/06/17   MRN:  [id number]  PCP:  Marcelina Morel    Chief Complaint: Medication Refill   History of Present Illness:  Robyn Montes is a 59 y.o. very pleasant female patient who presents with the following:  Pt came in this evening as she would like to change her BP medication- she independently decided to stop her metoprolol and take her losartan/ hct BID instead.  Her BP looks fine on this regimen.  Advised that this is an ok regimen to be on and we can write her meds this way if she likes  She also continues to be frustrated by weight gain Overdue for a TSH check  Wt Readings from Last 3 Encounters:  04/02/15 217 lb (98.431 kg)  12/22/14 215 lb (97.523 kg)  10/01/14 213 lb 6.4 oz (96.798 kg)     Patient Active Problem List   Diagnosis Date Noted  . Personal history of ongoing treatment with alendronate (Fosamax) 10/01/2014  . Hypothyroidism 10/01/2014  . Memory loss 08/21/2014  . Chronic hepatitis C ([place]) 08/04/2014  . History of other specified conditions presenting hazards to health 08/04/2014  . Palpitations 10/03/2013  . Essential hypertension 10/03/2013  . Biliary calculi 01/17/2013    Past Medical History  Diagnosis Date  . HTN (hypertension)   . Hypothyroid   . Hepatitis C 04/17/2013    acquired from blood transfusion; s/p [facility] treatment [facility] in [place].  . Family history of heart disease   . Palpitations   . Shortness of breath   . Blood transfusion without reported diagnosis   . Heart murmur   . Anemia 1976, 1984, 2001, 2003    history of multiple transfusions (3); iron deficiency  . Arthritis     hands, knees B; DDD lumbar  . Memory loss 08/21/2014    Past Surgical History  Procedure Laterality Date  . Liver biopsy      Social History  Substance Use Topics  . Smoking status: Never Smoker    . Smokeless tobacco: None  . Alcohol Use: None    Family History  Problem Relation Age of Onset  . Heart disease Mother     heart attack and stroke; passed away in her 51s  . Diabetes Mother   . Hyperlipidemia Mother   . Hypertension Mother   . Stroke Mother   . Heart disease Father     passed away in his 71s  . Hyperlipidemia Father   . Hypertension Sister   . Diabetes Brother   . Heart disease Brother   . Hypertension Brother   . Diabetes Brother     Allergies  Allergen Reactions  . Bee Venom     Medication list has been reviewed and updated.  Current Outpatient Prescriptions on File Prior to Visit  Medication Sig Dispense Refill  . Cholecalciferol 2000 UNITS TABS Take by mouth.    . donepezil (ARICEPT) 5 MG tablet Take 1 tablet (5 mg total) by mouth 2 (two) times daily. Start with once a day for 1 month 60 tablet 3  . EPIPEN 2-PAK 0.3 MG/0.3ML SOAJ injection USE AS DIRECTED 2 Device 1  . levothyroxine (SYNTHROID, LEVOTHROID) 100 MCG tablet Take 1 tablet (100 mcg total) by mouth daily. 90 tablet 2  . meloxicam (MOBIC) 15 MG tablet TAKE 1 TABLET BY MOUTH DAILY. USE  AS NEEDED FOR KNEE PAIN 30 tablet 0  . metFORMIN (GLUCOPHAGE) 500 MG tablet Take 1 tablet (500 mg total) by mouth 2 (two) times daily with a meal. 180 tablet 3  . Tetrahydroz-Glyc-Hyprom-PEG (VISINE MAXIMUM REDNESS RELIEF) 0.05-0.2-0.36-1 % SOLN Apply 2-3 drops to eye daily as needed (for eye redness).    . Vitamin D, Ergocalciferol, (DRISDOL) 50000 UNITS CAPS capsule TAKE 1 CAPSULE BY MOUTH EVERY 7 DAYS. 12 capsule 2   No current facility-administered medications on file prior to visit.    Review of Systems:  As per HPI- otherwise negative.   Physical Examination: Filed Vitals:   04/02/15 1836  BP: 112/68  Pulse: 90  Temp: 98.8 F (37.1 C)  Resp: 16   Filed Vitals:   04/02/15 1836  Weight: 217 lb (98.431 kg)   Body mass index is 35.55 kg/(m^2). Ideal Body Weight:    GEN: WDWN, NAD,  Non-toxic, A & O x 3, obese, looks well HEENT: Atraumatic, Normocephalic. Neck supple. No masses, No LAD. Ears and Nose: No external deformity. CV: RRR, No M/G/R. No JVD. No thrill. No extra heart sounds. PULM: CTA B, no wheezes, crackles, rhonchi. No retractions. No resp. distress. No accessory muscle use. EXTR: No c/c/e NEURO Normal gait.  PSYCH: Normally interactive. Conversant. Not depressed or anxious appearing.  Calm demeanor.    Assessment and Plan: Essential hypertension  Hypothyroidism due to acquired atrophy of thyroid - Plan: TSH  Screening for diabetes mellitus - Plan: Basic metabolic panel, Hemoglobin A1c  Advised that her BP does look good- she can continue to take her losartan/ hctz BID if she likes Check labs  Will give her a call once we make sure that her thyroid is ok- may be able to try some sort of weight loss medication for her  Meds ordered this encounter  Medications  . losartan-hydrochlorothiazide (HYZAAR) 50-12.5 MG tablet    Sig: Take 1 tablet by mouth 2 (two) times daily.    Dispense:  180 tablet    Refill:  3     Signed Lamar Blinks, MD  Called her to discuss her labs- her A1c is a little bit higher.  She admits that she had not been taking the metformin really due to safety concerns that she read about.  Reassured her that generally metformin is a very safe medication which we hope will prevent onset of diabetes and promote weight loss. She will give it a good try  Copy of labs to pt, plan to repeat labs in 3-4 months

## 2015-04-02 NOTE — Progress Notes (Deleted)
Urgent Medical and [facility] [address], [place] [address] [phone]  Date:  04/02/2015   Name:  Robyn Montes   DOB:  27-Aug-1955   MRN:  [id number]  PCP:  Marcelina Morel    Chief Complaint: Medication Refill   History of Present Illness:  This is a 59 y.o. female with PMH *** who is presenting  Review of Systems:  Review of Systems  Patient Active Problem List   Diagnosis Date Noted  . Personal history of ongoing treatment with alendronate (Fosamax) 10/01/2014  . Hypothyroidism 10/01/2014  . Memory loss 08/21/2014  . Chronic hepatitis C ([place]) 08/04/2014  . History of other specified conditions presenting hazards to health 08/04/2014  . Palpitations 10/03/2013  . Essential hypertension 10/03/2013  . Biliary calculi 01/17/2013    Prior to Admission medications   Medication Sig Start Date End Date Taking? Authorizing Provider  Cholecalciferol 2000 UNITS TABS Take by mouth.   Yes Historical Provider, MD  donepezil (ARICEPT) 5 MG tablet Take 1 tablet (5 mg total) by mouth 2 (two) times daily. Start with once a day for 1 month 08/21/14  Yes Darreld Mclean, MD  EPIPEN 2-PAK 0.3 MG/0.3ML SOAJ injection USE AS DIRECTED 12/11/14  Yes Wardell Honour, MD  levothyroxine (SYNTHROID, LEVOTHROID) 100 MCG tablet Take 1 tablet (100 mcg total) by mouth daily. 10/02/14  Yes Todd McVeigh, PA  losartan-hydrochlorothiazide (HYZAAR) 50-12.5 MG per tablet Take 1 tablet by mouth daily. 09/17/14  Yes Wardell Honour, MD  meloxicam (MOBIC) 15 MG tablet TAKE 1 TABLET BY MOUTH DAILY. USE AS NEEDED FOR KNEE PAIN 10/05/14  Yes Gay Filler Copland, MD  metFORMIN (GLUCOPHAGE) 500 MG tablet Take 1 tablet (500 mg total) by mouth 2 (two) times daily with a meal. 12/22/14  Yes Gay Filler Copland, MD  metoprolol succinate (TOPROL-XL) 50 MG 24 hr tablet TAKE 1 TABLET (50 MG TOTAL) BY MOUTH ONCE DAILY. 09/17/14  Yes Wardell Honour, MD  Tetrahydroz-Glyc-Hyprom-PEG (VISINE MAXIMUM REDNESS RELIEF)  0.05-0.2-0.36-1 % SOLN Apply 2-3 drops to eye daily as needed (for eye redness).   Yes Historical Provider, MD  Vitamin D, Ergocalciferol, (DRISDOL) 50000 UNITS CAPS capsule TAKE 1 CAPSULE BY MOUTH EVERY 7 DAYS. 10/30/14  Yes Wardell Honour, MD    Allergies  Allergen Reactions  . Bee Venom     Past Surgical History  Procedure Laterality Date  . Liver biopsy      Social History  Substance Use Topics  . Smoking status: Never Smoker   . Smokeless tobacco: None  . Alcohol Use: None    Family History  Problem Relation Age of Onset  . Heart disease Mother     heart attack and stroke; passed away in her 57s  . Diabetes Mother   . Hyperlipidemia Mother   . Hypertension Mother   . Stroke Mother   . Heart disease Father     passed away in his 84s  . Hyperlipidemia Father   . Hypertension Sister   . Diabetes Brother   . Heart disease Brother   . Hypertension Brother   . Diabetes Brother     Medication list has been reviewed and updated.  Physical Examination:  Physical Exam  BP 112/68 mmHg  Pulse 90  Temp(Src) 98.8 F (37.1 C)  Resp 16  Wt 217 lb (98.431 kg)  SpO2 97%  Assessment and Plan:

## 2015-04-02 NOTE — Patient Instructions (Signed)
I will give you a call with your labs asap- we can then discuss other options about your weight

## 2015-04-03 LAB — BASIC METABOLIC PANEL
BUN: 8 mg/dL (ref 7–25)
CO2: 27 mmol/L (ref 20–31)
Calcium: 9.3 mg/dL (ref 8.6–10.4)
Chloride: 103 mmol/L (ref 98–110)
Creat: 0.72 mg/dL (ref 0.50–1.05)
Glucose, Bld: 95 mg/dL (ref 65–99)
Potassium: 4 mmol/L (ref 3.5–5.3)
Sodium: 140 mmol/L (ref 135–146)

## 2015-04-03 LAB — TSH: TSH: 2.055 u[IU]/mL (ref 0.350–4.500)

## 2015-04-03 LAB — HEMOGLOBIN A1C
Hgb A1c MFr Bld: 6.1 % — ABNORMAL HIGH (ref <5.7)
Mean Plasma Glucose: 128 mg/dL — ABNORMAL HIGH (ref <117)

## 2015-04-07 ENCOUNTER — Encounter: Payer: Self-pay | Admitting: Family Medicine

## 2015-04-27 NOTE — Telephone Encounter (Signed)
Patient evaluated by Dr. Lorelei Pont on 04/02/15 to discuss medication changes.

## 2015-05-19 ENCOUNTER — Ambulatory Visit (INDEPENDENT_AMBULATORY_CARE_PROVIDER_SITE_OTHER): Payer: Medicare HMO | Admitting: Family Medicine

## 2015-05-19 ENCOUNTER — Encounter: Payer: Self-pay | Admitting: Family Medicine

## 2015-05-19 VITALS — BP 131/81 | HR 77 | Temp 97.9°F | Resp 16 | Ht 65.0 in | Wt 211.8 lb

## 2015-05-19 DIAGNOSIS — R7303 Prediabetes: Secondary | ICD-10-CM | POA: Diagnosis not present

## 2015-05-19 DIAGNOSIS — R002 Palpitations: Secondary | ICD-10-CM | POA: Diagnosis not present

## 2015-05-19 DIAGNOSIS — G8929 Other chronic pain: Secondary | ICD-10-CM | POA: Diagnosis not present

## 2015-05-19 DIAGNOSIS — R202 Paresthesia of skin: Secondary | ICD-10-CM

## 2015-05-19 DIAGNOSIS — R200 Anesthesia of skin: Secondary | ICD-10-CM

## 2015-05-19 DIAGNOSIS — L700 Acne vulgaris: Secondary | ICD-10-CM | POA: Diagnosis not present

## 2015-05-19 DIAGNOSIS — R1013 Epigastric pain: Secondary | ICD-10-CM

## 2015-05-19 DIAGNOSIS — R8299 Other abnormal findings in urine: Secondary | ICD-10-CM

## 2015-05-19 DIAGNOSIS — F410 Panic disorder [episodic paroxysmal anxiety] without agoraphobia: Secondary | ICD-10-CM

## 2015-05-19 DIAGNOSIS — R82998 Other abnormal findings in urine: Secondary | ICD-10-CM

## 2015-05-19 LAB — POCT CBC
Granulocyte percent: 61.4 %G (ref 37–80)
HCT, POC: 39.3 % (ref 37.7–47.9)
Hemoglobin: 13.4 g/dL (ref 12.2–16.2)
Lymph, poc: 3.4 (ref 0.6–3.4)
MCH, POC: 27 pg (ref 27–31.2)
MCHC: 34.1 g/dL (ref 31.8–35.4)
MCV: 79.3 fL — AB (ref 80–97)
MID (cbc): 0.3 (ref 0–0.9)
MPV: 9.2 fL (ref 0–99.8)
POC Granulocyte: 5.8 (ref 2–6.9)
POC LYMPH PERCENT: 35.7 %L (ref 10–50)
POC MID %: 2.9 %M (ref 0–12)
Platelet Count, POC: 200 10*3/uL (ref 142–424)
RBC: 4.95 M/uL (ref 4.04–5.48)
RDW, POC: 15 %
WBC: 9.4 10*3/uL (ref 4.6–10.2)

## 2015-05-19 LAB — POCT URINALYSIS DIP (MANUAL ENTRY)
Bilirubin, UA: NEGATIVE
Blood, UA: NEGATIVE
Glucose, UA: NEGATIVE
Leukocytes, UA: NEGATIVE
Nitrite, UA: NEGATIVE
Protein Ur, POC: NEGATIVE
Spec Grav, UA: 1.025
Urobilinogen, UA: 0.2
pH, UA: 5.5

## 2015-05-19 LAB — POCT WET + KOH PREP
Trich by wet prep: ABSENT
Yeast by KOH: ABSENT
Yeast by wet prep: ABSENT

## 2015-05-19 LAB — COMPREHENSIVE METABOLIC PANEL
ALT: 10 U/L (ref 6–29)
AST: 17 U/L (ref 10–35)
Albumin: 4.2 g/dL (ref 3.6–5.1)
Alkaline Phosphatase: 71 U/L (ref 33–130)
BUN: 7 mg/dL (ref 7–25)
CO2: 30 mmol/L (ref 20–31)
Calcium: 9.2 mg/dL (ref 8.6–10.4)
Chloride: 102 mmol/L (ref 98–110)
Creat: 0.68 mg/dL (ref 0.50–1.05)
Glucose, Bld: 88 mg/dL (ref 65–99)
Potassium: 3.8 mmol/L (ref 3.5–5.3)
Sodium: 140 mmol/L (ref 135–146)
Total Bilirubin: 0.4 mg/dL (ref 0.2–1.2)
Total Protein: 7.6 g/dL (ref 6.1–8.1)

## 2015-05-19 LAB — POC MICROSCOPIC URINALYSIS (UMFC): Mucus: ABSENT

## 2015-05-19 LAB — POCT GLYCOSYLATED HEMOGLOBIN (HGB A1C): Hemoglobin A1C: 5.8

## 2015-05-19 MED ORDER — SERTRALINE HCL 50 MG PO TABS: 50.0000 mg | ORAL_TABLET | Freq: Every day | ORAL | Status: DC

## 2015-05-19 NOTE — Patient Instructions (Addendum)
Please come and see me on Monday between 8 am and 1 pm- we will further discuss your concerns Start on the zoloft 50 mg once a day  It would be my pleasure to continue to see you as a patient at my new office, starting the last week of February.  If you prefer to remain at [facility] one of my partners will be happy to see you here.   [facility] at [facility] [address] [place], [place] [address] Phone: [phone]

## 2015-05-19 NOTE — Progress Notes (Addendum)
Urgent Medical and [facility] [address] [place] [address] [phone]  Date:  05/19/2015   Name:  Robyn Montes   DOB:  1955/07/29   MRN:  [id number]  PCP:  Marcelina Morel    Chief Complaint: Follow-up   History of Present Illness:  Robyn Montes is a 60 y.o. very pleasant female patient who presents with the following:  Here today to recheck her A1c- we had started metformin for her a couple of months ago for pre-diabetes.  She wants to check her A1c again today.  She is not taking the metformin and hopes to control her glucose with diet and exercise. She also has several other concerns today:   She wound like "to check and make sure there is not any infection." She has noted some darker colored urine, back pain, and a "hot feeling" in her foot.  She also described abd pain-  She feels "like someone is inside giving you a good punch" in her belly- she notes she has had these pains for years but they are worse for the last several months.   She has not had any recent sexual partners except an encounter with her ex a few months ago No vaginal discharge but she has noted a bit of an odor No pain with urination but she does note urgency.    She also has noted a feeling of numbness in her legs and arms, this can occur on either side of her body but more on he left.  The numbness with effect either the left arm AND leg, or the right arm AND leg The episodes of numbness last for about 1 minute.  She has noted these sx  for about one month. Sometimes the numbness will be associated with painful tingling. She also has noted heart palpitations that seem to occur right after the episodes of numbness.  She has not noted any chest pain  She also has noted "more, a little anxiety, and a little depressed."   She has had some issues with panic in the past- she has used zoloft and this did help her. She would like to go back on this medication.  However she does not feel that the sx  mentioned about are due to anxiety. She does not have any active SI  She has no history of CV disease or stroke.  She does have a poorly defined history of memory loss and has been on aricept- however she is no longer taking this either  She has been menopausal for 8 years or so Finally, she mentions that her insurance company is no longer covering a skin cream that she uses and she would like for me to rx accutane.  Explained to her that this is not a medication that I have rx in the past and that I would ask her to consult with a dermatologist if she would like this type of medication BP Readings from Last 3 Encounters:  05/19/15 131/81  04/02/15 112/68  12/22/14 102/60    Lab Results  Component Value Date   HGBA1C 6.1* 04/02/2015   Patient Active Problem List   Diagnosis Date Noted  . Personal history of ongoing treatment with alendronate (Fosamax) 10/01/2014  . Hypothyroidism 10/01/2014  . Memory loss 08/21/2014  . Chronic hepatitis C ([place]) 08/04/2014  . History of other specified conditions presenting hazards to health 08/04/2014  . Palpitations 10/03/2013  . Essential hypertension 10/03/2013  . Biliary calculi 01/17/2013  Past Medical History  Diagnosis Date  . HTN (hypertension)   . Hypothyroid   . Hepatitis C 04/17/2013    acquired from blood transfusion; s/p [facility] treatment [facility] in [place].  . Family history of heart disease   . Palpitations   . Shortness of breath   . Blood transfusion without reported diagnosis   . Heart murmur   . Anemia 1976, 1984, 2001, 2003    history of multiple transfusions (3); iron deficiency  . Arthritis     hands, knees B; DDD lumbar  . Memory loss 08/21/2014    Past Surgical History  Procedure Laterality Date  . Liver biopsy      Social History  Substance Use Topics  . Smoking status: Never Smoker   . Smokeless tobacco: None  . Alcohol Use: None    Family History  Problem Relation Age of Onset  . Heart disease  Mother     heart attack and stroke; passed away in her 34s  . Diabetes Mother   . Hyperlipidemia Mother   . Hypertension Mother   . Stroke Mother   . Heart disease Father     passed away in his 14s  . Hyperlipidemia Father   . Hypertension Sister   . Diabetes Brother   . Heart disease Brother   . Hypertension Brother   . Diabetes Brother     Allergies  Allergen Reactions  . Bee Venom     Medication list has been reviewed and updated.  Current Outpatient Prescriptions on File Prior to Visit  Medication Sig Dispense Refill  . Cholecalciferol 2000 UNITS TABS Take by mouth.    . donepezil (ARICEPT) 5 MG tablet Take 1 tablet (5 mg total) by mouth 2 (two) times daily. Start with once a day for 1 month 60 tablet 3  . EPIPEN 2-PAK 0.3 MG/0.3ML SOAJ injection USE AS DIRECTED 2 Device 1  . levothyroxine (SYNTHROID, LEVOTHROID) 100 MCG tablet Take 1 tablet (100 mcg total) by mouth daily. 90 tablet 2  . losartan-hydrochlorothiazide (HYZAAR) 50-12.5 MG tablet Take 1 tablet by mouth 2 (two) times daily. 180 tablet 3  . meloxicam (MOBIC) 15 MG tablet TAKE 1 TABLET BY MOUTH DAILY. USE AS NEEDED FOR KNEE PAIN 30 tablet 0  . metFORMIN (GLUCOPHAGE) 500 MG tablet Take 1 tablet (500 mg total) by mouth 2 (two) times daily with a meal. 180 tablet 3  . Tetrahydroz-Glyc-Hyprom-PEG (VISINE MAXIMUM REDNESS RELIEF) 0.05-0.2-0.36-1 % SOLN Apply 2-3 drops to eye daily as needed (for eye redness).    . Vitamin D, Ergocalciferol, (DRISDOL) 50000 UNITS CAPS capsule TAKE 1 CAPSULE BY MOUTH EVERY 7 DAYS. 12 capsule 2   No current facility-administered medications on file prior to visit.    Review of Systems:  As per HPI- otherwise negative.   Physical Examination: Filed Vitals:   05/19/15 1333  BP: 131/81  Pulse: 77  Temp: 97.9 F (36.6 C)  Resp: 16   Filed Vitals:   05/19/15 1333  Height: 5\' 5"  (1.651 m)  Weight: 211 lb 12.8 oz (96.072 kg)   Body mass index is 35.25 kg/(m^2). Ideal Body  Weight: Weight in (lb) to have BMI = 25: 149.9  GEN: WDWN, NAD, Non-toxic, A & O x 3, obese, looks well HEENT: Atraumatic, Normocephalic. Neck supple. No masses, No LAD.  Bilateral TM wnl, oropharynx normal.  PEERL,EOMI.   Ears and Nose: No external deformity. CV: RRR, No M/G/R. No JVD. No thrill. No extra heart sounds. PULM: CTA  B, no wheezes, crackles, rhonchi. No retractions. No resp. distress. No accessory muscle use. ABD: S, NT, ND. No rebound. No HSM. EXTR: No c/c/e NEURO Normal gait. Normal strength, sensation and DTR of all extremities.  No facial droop or weakness.  No evidence of any neurological compromise PSYCH: Normally interactive. Conversant. Not depressed or anxious appearing.  Calm demeanor.  Pelvic: normal, no vaginal lesions or discharge. Uterus normal, no CMT, no adnexal tendereness or masses  EKG: SR, no evidence of acute ischemia or arrythmia.   Results for orders placed or performed in visit on 05/19/15  POCT glycosylated hemoglobin (Hb A1C)  Result Value Ref Range   Hemoglobin A1C 5.8   POCT urinalysis dipstick  Result Value Ref Range   Color, UA yellow yellow   Clarity, UA clear clear   Glucose, UA negative negative   Bilirubin, UA negative negative   Ketones, POC UA trace (5) (A) negative   Spec Grav, UA 1.025    Blood, UA negative negative   pH, UA 5.5    Protein Ur, POC negative negative   Urobilinogen, UA 0.2    Nitrite, UA Negative Negative   Leukocytes, UA Negative Negative  POCT Microscopic Urinalysis (UMFC)  Result Value Ref Range   WBC,UR,HPF,POC None None WBC/hpf   RBC,UR,HPF,POC None None RBC/hpf   Bacteria None None, Too numerous to count   Mucus Absent Absent   Epithelial Cells, UR Per Microscopy Few (A) None, Too numerous to count cells/hpf  POCT CBC  Result Value Ref Range   WBC 9.4 4.6 - 10.2 K/uL   Lymph, poc 3.4 0.6 - 3.4   POC LYMPH PERCENT 35.7 10 - 50 %L   MID (cbc) 0.3 0 - 0.9   POC MID % 2.9 0 - 12 %M   POC Granulocyte 5.8  2 - 6.9   Granulocyte percent 61.4 37 - 80 %G   RBC 4.95 4.04 - 5.48 M/uL   Hemoglobin 13.4 12.2 - 16.2 g/dL   HCT, POC 39.3 37.7 - 47.9 %   MCV 79.3 (A) 80 - 97 fL   MCH, POC 27.0 27 - 31.2 pg   MCHC 34.1 31.8 - 35.4 g/dL   RDW, POC 15.0 %   Platelet Count, POC 200 142 - 424 K/uL   MPV 9.2 0 - 99.8 fL  POCT Wet + KOH Prep  Result Value Ref Range   Yeast by KOH Absent Present, Absent   Yeast by wet prep Absent Present, Absent   WBC by wet prep Few None, Few, Too numerous to count   Clue Cells Wet Prep HPF POC None None, Too numerous to count   Trich by wet prep Absent Present, Absent   Bacteria Wet Prep HPF POC None None, Few, Too numerous to count   Epithelial Cells By [employer] ([facility]FC) Few None, Few, Too numerous to count   RBC,UR,HPF,POC None None RBC/hpf    Assessment and Plan: Palpitations - Plan: EKG 12-Lead  Abdominal pain, chronic, epigastric - Plan: Urine culture, POCT CBC, POCT Wet + KOH Prep, CANCELED: GC/Chlamydia Probe Amp  Numbness and tingling of left leg - Plan: Ambulatory referral to Neurology  Dark urine - Plan: Urine culture, POCT urinalysis dipstick, POCT Microscopic Urinalysis ([facility]FC), GC/Chlamydia Probe Amp  Borderline diabetes - Plan: POCT glycosylated hemoglobin (Hb A1C), Comprehensive metabolic panel  Anxiety attack - Plan: sertraline (ZOLOFT) 50 MG tablet  Acne vulgaris - Plan: Ambulatory referral to Dermatology   Here today with several concerns and also  arrived after her visit start time.  I spent approx one hour face to face with this pt today; have asked her to come back next week to discuss further EKG is normal.  Recent TSH normal.  Will watch for any worsening of her palpitations - at this time they are likely related to anxiety Lab Results  Component Value Date   TSH 2.055 04/02/2015   Her urine and wet prep today are negative, await genprobe. However reassured her that STI or UTI are unlikely Her A1c is improved- keep up the good  work Refilled her zoloft and will start her back on 50 mg She also endorses some brief- about 1 minute- episodes of numbness that can occur in either side of her body.  She has noted these for one month. At this time I doubt any acute or dangerous neurological pathology.  Suspect these episodes are due to anxiety.  Will plan to discuss with pt further at our follow-up visit and will also refer to neurology to discuss these episodes of numbness  Meds ordered this encounter  Medications  . sertraline (ZOLOFT) 50 MG tablet    Sig: Take 1 tablet (50 mg total) by mouth daily.    Dispense:  30 tablet    Refill:  11     Signed Lamar Blinks, MD

## 2015-05-20 LAB — GC/CHLAMYDIA PROBE AMP
CT Probe RNA: NOT DETECTED
GC Probe RNA: NOT DETECTED

## 2015-05-20 NOTE — Addendum Note (Signed)
Addended by: Lamar Blinks C on: 05/20/2015 04:30 PM   Modules accepted: Orders

## 2015-05-21 LAB — URINE CULTURE
Colony Count: NO GROWTH
Organism ID, Bacteria: NO GROWTH

## 2015-05-23 ENCOUNTER — Encounter: Payer: Self-pay | Admitting: Family Medicine

## 2015-06-14 ENCOUNTER — Ambulatory Visit: Payer: Medicare HMO | Admitting: Neurology

## 2015-06-14 ENCOUNTER — Telehealth: Payer: Self-pay | Admitting: *Deleted

## 2015-06-14 NOTE — Telephone Encounter (Signed)
No show new patient appointment.

## 2015-06-15 ENCOUNTER — Encounter: Payer: Self-pay | Admitting: Neurology

## 2015-08-04 ENCOUNTER — Telehealth: Payer: Self-pay

## 2015-08-04 NOTE — Telephone Encounter (Signed)
[facility] dermatology is calling because the patient was referred by [place] and did not show up for the appointment today

## 2015-08-18 ENCOUNTER — Other Ambulatory Visit: Payer: Self-pay | Admitting: Emergency Medicine

## 2015-08-23 ENCOUNTER — Other Ambulatory Visit: Payer: Self-pay | Admitting: Family Medicine

## 2015-08-23 ENCOUNTER — Other Ambulatory Visit: Payer: Self-pay | Admitting: Emergency Medicine

## 2015-08-23 MED ORDER — EPINEPHRINE 0.3 MG/0.3ML IJ SOAJ: INTRAMUSCULAR | Status: DC

## 2015-08-23 MED ORDER — ADAPALENE-BENZOYL PEROXIDE 0.1-2.5 % EX GEL: CUTANEOUS | Status: DC

## 2015-08-25 ENCOUNTER — Telehealth: Payer: Self-pay

## 2015-08-25 DIAGNOSIS — Z711 Person with feared health complaint in whom no diagnosis is made: Secondary | ICD-10-CM

## 2015-08-25 NOTE — Telephone Encounter (Signed)
Patient called in regarding order needed by [facility]. States the need the order for Diagnostic Mammogram so that can do more in depth study. Also would like refill on Epiduo Gel Pump 0.1%-2.5% which was prescribed by another physician.

## 2015-08-26 NOTE — Addendum Note (Signed)
Addended by: Lamar Blinks C on: 08/26/2015 01:02 PM   Modules accepted: Orders

## 2015-08-26 NOTE — Telephone Encounter (Signed)
Refilled her epidueo yesterday. Called her back about request for diagnostic mammo.  She notes some feeling of increased density in the right breast and is concerned about it

## 2015-08-30 NOTE — Telephone Encounter (Signed)
Received fax from pharmacy saying insurance does not cover Epiduo gel. They are suggesting to try sulfacetamide sodium (acne), and erythromycin-benzoyl peroxide. Please advise. JG//CMA

## 2015-08-31 ENCOUNTER — Other Ambulatory Visit: Payer: Self-pay | Admitting: Family Medicine

## 2015-08-31 DIAGNOSIS — R922 Inconclusive mammogram: Secondary | ICD-10-CM

## 2015-09-01 NOTE — Telephone Encounter (Signed)
Please advise. JG//CMA

## 2015-09-01 NOTE — Telephone Encounter (Signed)
Kellie from pharmacy called in to check the status of medication PA. Advised her of the below. She would like a follow up call to pt once decided to update her if possible.

## 2015-09-01 NOTE — Telephone Encounter (Signed)
Called pt and Robyn Montes- epiduo not covered. Ok if I do alternative meds?  Please let me know

## 2015-09-03 NOTE — Telephone Encounter (Signed)
Returned call. Pt has tried and failed suggested alternatives in the past. Would like to proceed w/ PA for Epiduo. Pt aware we will be in touch with her when we receive determination from insurance company.  Response to alternatives: sulfacetamide sodium- skin breakouts, GI upset, abdominal pain erythromycin-benzoyl peroxide- diarrhea, vomiting

## 2015-09-03 NOTE — Telephone Encounter (Signed)
Received call from [facility] w/ [place] checking on status of PA. Asking if there is anyone in our office who can work on [place] today, as pt has been waiting for medication for 2 weeks.

## 2015-09-03 NOTE — Telephone Encounter (Signed)
Can be reached: [phone]   Reason for call: Pt returning call. Wants nurse to call back.

## 2015-09-06 NOTE — Telephone Encounter (Signed)
PA submitted 09/03/15 by Desmond Dike, CMA. Awaiting determination.

## 2015-09-27 ENCOUNTER — Ambulatory Visit
Admission: RE | Admit: 2015-09-27 | Discharge: 2015-09-27 | Disposition: A | Discharge status: Home / Self Care | Payer: Medicare HMO | Source: Ambulatory Visit | Attending: Family Medicine | Admitting: Family Medicine

## 2015-09-27 DIAGNOSIS — R922 Inconclusive mammogram: Secondary | ICD-10-CM

## 2015-10-04 ENCOUNTER — Telehealth: Payer: Self-pay | Admitting: Emergency Medicine

## 2015-10-04 MED ORDER — DONEPEZIL HCL 5 MG PO TABS
5.0000 mg | ORAL_TABLET | Freq: Every day | ORAL | Status: DC
Start: 2015-10-04 — End: 2015-10-27

## 2015-10-04 NOTE — Telephone Encounter (Signed)
Received refill request for Donepezil HCL 5 MG Tablet Take 1 Tablet by mouth 2 times daily. This medication is not on the patient's medication list.   Last ov: 05/19/2015  Is it ok to refill? Please advise. Thanks

## 2015-10-27 ENCOUNTER — Telehealth: Payer: Self-pay | Admitting: Family Medicine

## 2015-10-27 DIAGNOSIS — R413 Other amnesia: Secondary | ICD-10-CM

## 2015-10-27 MED ORDER — DONEPEZIL HCL 10 MG PO TABS
10.0000 mg | ORAL_TABLET | Freq: Every day | ORAL | Status: DC
Start: 2015-10-27 — End: 2021-11-09

## 2015-10-27 NOTE — Telephone Encounter (Signed)
Called her- need more detail. Will try back

## 2015-10-27 NOTE — Telephone Encounter (Signed)
Spoke with pharm D and changed to 10 mg

## 2015-10-27 NOTE — Telephone Encounter (Signed)
Caller name: Claiborne Billings Relationship to patient: [facility] Can be reached: [phone]   Reason for call: Patient's insurance will not pay for the prescribed dosage of donepezil (ARICEPT) 5 MG tablet. Request increase in strength

## 2016-01-04 ENCOUNTER — Telehealth: Payer: Self-pay | Admitting: Internal Medicine

## 2016-01-04 NOTE — Telephone Encounter (Signed)
°  Relation to [id number]  Call back number:[phone] [facility]acy: [id number] [place], [place]  Reason for call:  Patient scheduled new patient for 02/07/16 at 4pm. Patient requesting a refill levothyroxine (SYNTHROID, LEVOTHROID) 100 MCG tablet.

## 2016-01-06 ENCOUNTER — Other Ambulatory Visit: Payer: Self-pay | Admitting: Emergency Medicine

## 2016-01-06 DIAGNOSIS — E038 Other specified hypothyroidism: Secondary | ICD-10-CM

## 2016-01-06 MED ORDER — LEVOTHYROXINE SODIUM 100 MCG PO TABS: 100.0000 ug | ORAL_TABLET | Freq: Every day | ORAL | 0 refills | Status: DC

## 2016-01-06 NOTE — Telephone Encounter (Signed)
[facility]othyroxine refill sent to [facility].

## 2016-02-04 ENCOUNTER — Telehealth: Payer: Self-pay | Admitting: Behavioral Health

## 2016-02-04 ENCOUNTER — Other Ambulatory Visit: Payer: Self-pay | Admitting: Emergency Medicine

## 2016-02-04 ENCOUNTER — Encounter: Payer: Self-pay | Admitting: Behavioral Health

## 2016-02-04 DIAGNOSIS — F410 Panic disorder [episodic paroxysmal anxiety] without agoraphobia: Secondary | ICD-10-CM

## 2016-02-04 MED ORDER — METOPROLOL SUCCINATE ER 50 MG PO TB24: 50.0000 mg | ORAL_TABLET | Freq: Every day | ORAL | 1 refills | Status: DC

## 2016-02-04 NOTE — Telephone Encounter (Signed)
Pre-Visit Call completed with patient and chart updated.   Pre-Visit Info documented in Specialty Comments under [employer].    

## 2016-02-04 NOTE — Telephone Encounter (Signed)
Received refill request for Sertraline HCL 50 MG Tablet. Pt had several refills on this med so I called to verify if she actually needed this filled now. Pt states that she does not need Sertraline filled now and that she has an appt here coming up soon and that she would like to wait to refill it then.  Pt did request a 90 day refill on Metoprolol. Refill sent to [facility] per pt request.

## 2016-02-07 ENCOUNTER — Ambulatory Visit: Payer: Medicare HMO | Admitting: Family Medicine

## 2016-02-09 ENCOUNTER — Telehealth: Payer: Self-pay | Admitting: Internal Medicine

## 2016-02-09 NOTE — Telephone Encounter (Signed)
[employer] is calling regarding an prescription for pull-ups and under pads. They are stating they have faxed the form over multiple times but have not been contacted back. Please advise.    Contact Info: [phone] (anyone can assist)

## 2016-02-10 NOTE — Telephone Encounter (Signed)
Please call them back and be sure they have the proper fax number.  I have not received anything from them. Maybe they are sending it to [facility] by mistake

## 2016-02-10 NOTE — Telephone Encounter (Signed)
Called [employer] who states as of yesterday the patients account has been inactivated. States the patient will have to cal them to activate the account. Patient notified.

## 2016-02-28 ENCOUNTER — Other Ambulatory Visit: Payer: Self-pay | Admitting: Family Medicine

## 2016-02-28 DIAGNOSIS — E038 Other specified hypothyroidism: Secondary | ICD-10-CM

## 2016-03-01 ENCOUNTER — Other Ambulatory Visit: Payer: Self-pay | Admitting: Family Medicine

## 2016-03-01 DIAGNOSIS — R7303 Prediabetes: Secondary | ICD-10-CM

## 2016-03-03 ENCOUNTER — Other Ambulatory Visit: Payer: Self-pay | Admitting: Emergency Medicine

## 2016-03-03 MED ORDER — METFORMIN HCL 500 MG PO TABS: 500.0000 mg | ORAL_TABLET | Freq: Every day | ORAL | 0 refills | Status: DC

## 2016-03-28 ENCOUNTER — Telehealth: Payer: Self-pay

## 2016-03-29 ENCOUNTER — Other Ambulatory Visit: Payer: Self-pay | Admitting: Family Medicine

## 2016-03-29 ENCOUNTER — Other Ambulatory Visit: Payer: Self-pay | Admitting: Emergency Medicine

## 2016-03-29 ENCOUNTER — Telehealth: Payer: Self-pay | Admitting: Family Medicine

## 2016-03-29 ENCOUNTER — Ambulatory Visit (INDEPENDENT_AMBULATORY_CARE_PROVIDER_SITE_OTHER): Payer: Medicare HMO | Admitting: Family Medicine

## 2016-03-29 ENCOUNTER — Encounter: Payer: Self-pay | Admitting: Gastroenterology

## 2016-03-29 VITALS — BP 124/82 | HR 82 | Ht 65.0 in | Wt 208.6 lb

## 2016-03-29 DIAGNOSIS — I10 Essential (primary) hypertension: Secondary | ICD-10-CM | POA: Diagnosis not present

## 2016-03-29 DIAGNOSIS — E038 Other specified hypothyroidism: Secondary | ICD-10-CM

## 2016-03-29 DIAGNOSIS — R7303 Prediabetes: Secondary | ICD-10-CM | POA: Diagnosis not present

## 2016-03-29 DIAGNOSIS — R200 Anesthesia of skin: Secondary | ICD-10-CM | POA: Diagnosis not present

## 2016-03-29 DIAGNOSIS — Z13220 Encounter for screening for lipoid disorders: Secondary | ICD-10-CM

## 2016-03-29 DIAGNOSIS — Z130 Encounter for screening for diseases of the blood and blood-forming organs and certain disorders involving the immune mechanism: Secondary | ICD-10-CM

## 2016-03-29 DIAGNOSIS — Z23 Encounter for immunization: Secondary | ICD-10-CM | POA: Diagnosis not present

## 2016-03-29 DIAGNOSIS — M7989 Other specified soft tissue disorders: Secondary | ICD-10-CM

## 2016-03-29 DIAGNOSIS — R002 Palpitations: Secondary | ICD-10-CM

## 2016-03-29 DIAGNOSIS — R202 Paresthesia of skin: Secondary | ICD-10-CM | POA: Diagnosis not present

## 2016-03-29 DIAGNOSIS — F410 Panic disorder [episodic paroxysmal anxiety] without agoraphobia: Secondary | ICD-10-CM

## 2016-03-29 DIAGNOSIS — E034 Atrophy of thyroid (acquired): Secondary | ICD-10-CM | POA: Diagnosis not present

## 2016-03-29 DIAGNOSIS — M799 Soft tissue disorder, unspecified: Secondary | ICD-10-CM

## 2016-03-29 DIAGNOSIS — Z8639 Personal history of other endocrine, nutritional and metabolic disease: Secondary | ICD-10-CM

## 2016-03-29 DIAGNOSIS — K921 Melena: Secondary | ICD-10-CM

## 2016-03-29 DIAGNOSIS — Z113 Encounter for screening for infections with a predominantly sexual mode of transmission: Secondary | ICD-10-CM

## 2016-03-29 DIAGNOSIS — N393 Stress incontinence (female) (male): Secondary | ICD-10-CM

## 2016-03-29 LAB — TSH: TSH: 1.74 u[IU]/mL (ref 0.35–4.50)

## 2016-03-29 LAB — CBC
HCT: 41.9 % (ref 36.0–46.0)
Hemoglobin: 13.7 g/dL (ref 12.0–15.0)
MCHC: 32.7 g/dL (ref 30.0–36.0)
MCV: 81.8 fl (ref 78.0–100.0)
Platelets: 240 10*3/uL (ref 150.0–400.0)
RBC: 5.13 Mil/uL — ABNORMAL HIGH (ref 3.87–5.11)
RDW: 15.8 % — ABNORMAL HIGH (ref 11.5–15.5)
WBC: 11.2 10*3/uL — ABNORMAL HIGH (ref 4.0–10.5)

## 2016-03-29 LAB — COMPREHENSIVE METABOLIC PANEL
ALT: 10 U/L (ref 0–35)
AST: 17 U/L (ref 0–37)
Albumin: 4.5 g/dL (ref 3.5–5.2)
Alkaline Phosphatase: 67 U/L (ref 39–117)
BUN: 10 mg/dL (ref 6–23)
CO2: 28 mEq/L (ref 19–32)
Calcium: 9.8 mg/dL (ref 8.4–10.5)
Chloride: 103 mEq/L (ref 96–112)
Creatinine, Ser: 0.75 mg/dL (ref 0.40–1.20)
GFR: 101.72 mL/min (ref >60.00)
Glucose, Bld: 83 mg/dL (ref 70–99)
Potassium: 3.5 mEq/L (ref 3.5–5.1)
Sodium: 140 mEq/L (ref 135–145)
Total Bilirubin: 0.5 mg/dL (ref 0.2–1.2)
Total Protein: 8.2 g/dL (ref 6.0–8.3)

## 2016-03-29 LAB — HEMOGLOBIN A1C: Hgb A1c MFr Bld: 5.9 % (ref 4.6–6.5)

## 2016-03-29 LAB — LIPID PANEL
Cholesterol: 163 mg/dL (ref 0–200)
HDL: 36.7 mg/dL — ABNORMAL LOW (ref >39.00)
LDL Cholesterol: 114 mg/dL — ABNORMAL HIGH (ref 0–99)
NonHDL: 126.22
Total CHOL/HDL Ratio: 4
Triglycerides: 62 mg/dL (ref 0.0–149.0)
VLDL: 12.4 mg/dL (ref 0.0–40.0)

## 2016-03-29 LAB — VITAMIN D 25 HYDROXY (VIT D DEFICIENCY, FRACTURES): VITD: 22.44 ng/mL — ABNORMAL LOW (ref 30.00–100.00)

## 2016-03-29 MED ORDER — SERTRALINE HCL 50 MG PO TABS: 50.0000 mg | ORAL_TABLET | Freq: Every day | ORAL | 3 refills | Status: DC

## 2016-03-29 MED ORDER — SYNTHROID 100 MCG PO TABS: 100.0000 ug | ORAL_TABLET | Freq: Every day | ORAL | 3 refills | Status: DC

## 2016-03-29 MED ORDER — METFORMIN HCL 500 MG PO TABS: 500.0000 mg | ORAL_TABLET | Freq: Every day | ORAL | 3 refills | Status: DC

## 2016-03-29 MED ORDER — LOSARTAN POTASSIUM-HCTZ 50-12.5 MG PO TABS: 1.0000 | ORAL_TABLET | Freq: Two times a day (BID) | ORAL | 3 refills | Status: DC

## 2016-03-29 NOTE — Progress Notes (Signed)
Pre visit review using our clinic review tool, if applicable. No additional management support is needed unless otherwise documented below in the visit note. 

## 2016-03-29 NOTE — Patient Instructions (Addendum)
It was good to see you today- we will get your labs as below. I will be  I will check a urine culture to look for any sign of an infection- if this is negative we can try treating you for incontinence I am going to set you up to see gastroenterology and also neurology- we will contact you with these appointments   We will check your thyroid, blood sugar, and vitamin D today, as well as your cholesterol   We will arrange an ultrasound to look at the bump on your back. If it is a lipoma (benign fatty tumor) you could have it removed surgically if you like  I am also going to have you see cardiology for evaluation- if you have any change or worsening of your heart symptoms in the meantime please seek care.  It is a good idea to continue a daily aspirin for now

## 2016-03-29 NOTE — Progress Notes (Signed)
[place] [facility] [address] [place], [place] [address] [id number] [phone]  Date:  03/29/2016   Name:  Robyn Montes   DOB:  [birth year]   MRN:  [id number]  PCP:  Lamar Blinks, MD    Chief Complaint: Establish Care (Pt here to est care. c/o heart palpatation and alot of heart flutters. also c/o blood in stool. c/o occ pain in head and then blurry vision follows, sx's comes and go for the past few months. )   History of Present Illness:  Robyn Montes is a 60 y.o. very pleasant female patient who presents with the following:  Last seen by myself in February with the following HPI:  Here today to recheck her A1c- we had started metformin for her a couple of months ago for pre-diabetes.  She wants to check her A1c again today.  She is not taking the metformin and hopes to control her glucose with diet and exercise. She also has several other concerns today:   She wound like "to check and make sure there is not any infection." She has noted some darker colored urine, back pain, and a "hot feeling" in her foot.  She also described abd pain-  She feels "like someone is inside giving you a good punch" in her belly- she notes she has had these pains for years but they are worse for the last several months.   She has not had any recent sexual partners except an encounter with her ex a few months ago No vaginal discharge but she has noted a bit of an odor No pain with urination but she does note urgency.    She also has noted a feeling of numbness in her legs and arms, this can occur on either side of her body but more on he left.  The numbness with effect either the left arm AND leg, or the right arm AND leg The episodes of numbness last for about 1 minute.  She has noted these sx  for about one month. Sometimes the numbness will be associated with painful tingling. She also has noted heart palpitations that seem to occur right after  the episodes of numbness.  She has not noted any chest pain  She also has noted "more, a little anxiety, and a little depressed."   She has had some issues with panic in the past- she has used zoloft and this did help her. She would like to go back on this medication.  However she does not feel that the sx mentioned about are due to anxiety. She does not have any active SI  She has no history of CV disease or stroke.  She does have a poorly defined history of memory loss and has been on aricept- however she is no longer taking this either  She has been menopausal for 8 years or so Finally, she mentions that her insurance company is no longer covering a skin cream that she uses and she would like for me to rx accutane.  Explained to her that this is not a medication that I have rx in the past and that I would ask her to consult with a dermatologist if she would like this type of medication  Lab Results  Component Value Date   HGBA1C 5.8 05/19/2015   She states that for a year or so she noted episodes of numbness that may occur in her arms or legs, and this might last  for a few seconds.  She also has noted some instances where her vision seems to "go out" for a few seconds, it will be blurry, may last for "a few minutes."  Her last eye exam was about a year ago. She does not wear glasses or contacts except for readers. She notes "pressure in my head" that will come and go. I had referred her to neurology earlier this year for similar sx but she was discharged from [facility] after no- showing her appt.    She has noted blood in her stool, off an on for a year.  This has been more frequent for the last 6 weeks.  She will now notice it nearly every time she has a stool- it iwill be on the tissue, and can be mixed in the stool as well She had a virtual colonoscopy 6/16 which was reassuing She would like to see GI now however to evaluate her bleeding  She would like to have labs today- she has not eaten  anything as of yet today.    Her hep C is cured as far as she knows- she did not go back for her final follow-up visit however  She takes metformin some- not every day She is still taking synthroid  History of vitamin D def which was treated in the past with rx supplement  She notes a history of chronic urinary leakage since childbirth many years ago.   She feels like her urinary stream as not as good as it was in the past She has noted urinary odor for a year or so  Asked about heart palpitations which she had mentioned to my CMA-  She has noted these for years, but feels like they are getting worse and the feeling that she gets is different.  She will feel like she cannot breathe, and "like my heart is just not beating in rhythm" for the last 3-4 months.  This feeling may last about 5 minutes and goes away when she takes an aspirin.  Her description of the sensation is rather vague- she states that she does not have chest pain or tightness but describes the feeling "that my heart is just not beating, like when you're having a heart attack."  Last episode occurred 3 days ago- no acute CP or SOB This feeling may occur abut once a week and is not associated with exertion She did see cardiology 6-7 years ago and did a heart monitor, treadmill which she reports were normal She has no history of MI.   Her father had some sort of heart problem and died of this at age - he was 59 years old  She is a never smoker  She would also like STI testing today for routine screening   Finally, she is concerned about a soft tissue mass on her right upper back which is getting larger. It is not painful but is now visible through her clothing.  She might be interested in operative removal   Patient Active Problem List   Diagnosis Date Noted  . Personal history of ongoing treatment with alendronate (Fosamax) 10/01/2014  . Hypothyroidism 10/01/2014  . Memory loss 08/21/2014  . Chronic hepatitis C ([place])  08/04/2014  . History of other specified conditions presenting hazards to health 08/04/2014  . Palpitations 10/03/2013  . Essential hypertension 10/03/2013  . Biliary calculi 01/17/2013    Past Medical History:  Diagnosis Date  . Anemia 1976, 1984, 2001, 2003   history of multiple transfusions (  3); iron deficiency  . Arthritis    hands, knees B; DDD lumbar  . Blood transfusion without reported diagnosis   . Family history of heart disease   . Heart murmur   . Hepatitis C 04/17/2013   acquired from blood transfusion; s/p [facility] in [place].  [place] HTN (hypertension)   . Hypothyroid   . Memory loss 08/21/2014  . Palpitations   . Shortness of breath     Past Surgical History:  Procedure Laterality Date  . LIVER BIOPSY      Social History  Substance Use Topics  . Smoking status: Never Smoker  . Smokeless tobacco: Not on file  . Alcohol use Not on file    Family History  Problem Relation Age of Onset  . Heart disease Mother     heart attack and stroke; passed away in her 33s  . Diabetes Mother   . Hyperlipidemia Mother   . Hypertension Mother   . Stroke Mother   . Heart disease Father     passed away in his 92s  . Hyperlipidemia Father   . Diabetes Brother   . Heart disease Brother   . Hypertension Brother   . Diabetes Brother   . Hypertension Sister     Allergies  Allergen Reactions  . Bee Venom     Medication list has been reviewed and updated.  Current Outpatient Prescriptions on File Prior to Visit  Medication Sig Dispense Refill  . Adapalene-Benzoyl Peroxide 0.1-2.5 % gel Apply topically Nightly. 45 g 3  . Cholecalciferol 2000 UNITS TABS Take by mouth.    . donepezil (ARICEPT) 10 MG tablet Take 1 tablet (10 mg total) by mouth at bedtime. May increase to 2 pills after 4 weeks 30 tablet 5  . EPINEPHrine (EPIPEN 2-PAK) 0.3 mg/0.3 mL IJ SOAJ injection USE AS DIRECTED 2 Device 1  . losartan-hydrochlorothiazide (HYZAAR) 50-12.5 MG tablet  Take 1 tablet by mouth 2 (two) times daily. 180 tablet 3  . metFORMIN (GLUCOPHAGE) 500 MG tablet Take 1 tablet (500 mg total) by mouth daily with breakfast. 30 tablet 0  . metoprolol succinate (TOPROL-XL) 50 MG 24 hr tablet Take 1 tablet (50 mg total) by mouth daily. Take with or immediately following a meal. 90 tablet 1  . sertraline (ZOLOFT) 50 MG tablet Take 1 tablet (50 mg total) by mouth daily. 30 tablet 11  . SYNTHROID 100 MCG tablet Take 1 tablet (100 mcg total) by mouth daily. Time for office visit 30 tablet 2  . Tetrahydroz-Glyc-Hyprom-PEG (VISINE MAXIMUM REDNESS RELIEF) 0.05-0.2-0.36-1 % SOLN Apply 2-3 drops to eye daily as needed (for eye redness).    . Vitamin D, Ergocalciferol, (DRISDOL) 50000 UNITS CAPS capsule TAKE 1 CAPSULE BY MOUTH EVERY 7 DAYS. 12 capsule 2   No current facility-administered medications on file prior to visit.     Review of Systems:  As per HPI- otherwise negative.   Physical Examination: Blood pressure 124/82, pulse 82, height 5\' 5"  (1.651 m), weight 208 lb 9.6 oz (94.6 kg), SpO2 98 %.  Vitals:   03/29/16 1050  Weight: 208 lb 9.6 oz (94.6 kg)  Height: 5\' 5"  (1.651 m)   Body mass index is 34.71 kg/m. Ideal Body Weight: Weight in (lb) to have BMI = 25: 149.9  .GEN: WDWN, NAD, Non-toxic, A & O x 3, obese, looks well HEENT: Atraumatic, Normocephalic. Neck supple. No masses, No LAD.  Bilateral TM wnl, oropharynx normal.  PEERL,EOMI.   Ears and Nose: No  external deformity. CV: RRR, No M/G/R. No JVD. No thrill. No extra heart sounds. PULM: CTA B, no wheezes, crackles, rhonchi. No retractions. No resp. distress. No accessory muscle use. ABD: S, NT, ND, +BS. No rebound. No HSM. EXTR: No c/c/e NEURO Normal gait.  PSYCH: Normally interactive. Conversant. Not depressed or anxious appearing.  Calm demeanor. She has a half softball sized soft mass likely a lipoma over her right scapula   EKG: having difficulty with EKG machine today. V5 will not read and  there is excessive artifact.  Otherwise EKG shows SR, and no signficant or acute change since prior EKG 05/2015 Assessment and Plan: Numbness and tingling of left leg  Essential hypertension, benign - Plan: Comprehensive metabolic panel, losartan-hydrochlorothiazide (HYZAAR) 50-12.5 MG tablet  Hypothyroidism due to acquired atrophy of thyroid - Plan: TSH  Borderline diabetes - Plan: Hemoglobin A1c, metFORMIN (GLUCOPHAGE) 500 MG tablet  Stress incontinence of urine - Plan: Urine culture  History of vitamin D deficiency - Plan: Vitamin D (25 hydroxy)  Screening for deficiency anemia - Plan: CBC  Screening for hyperlipidemia - Plan: Lipid panel  Blood in stool - Plan: Ambulatory referral to Gastroenterology  Routine screening for STI (sexually transmitted infection) - Plan: Hepatitis B surface antibody, Hepatitis B surface antigen, HIV antibody, RPR, GC/Chlamydia Probe Amp  Heart palpitations - Plan: EKG 12-Lead, Ambulatory referral to Cardiology  Numbness and tingling of upper and lower extremities of both sides - Plan: Ambulatory referral to Neurology  Mass of soft tissue - Plan: [place] Chest, CANCELED: [place] Misc Soft Tissue, CANCELED: [place] Spine  Encounter for immunization - Plan: CBC, Comprehensive metabolic panel, Lipid panel, TSH, Vitamin D (25 hydroxy), Hemoglobin A1c, Urine culture, Ambulatory referral to Gastroenterology, EKG 12-Lead, Hepatitis B surface antibody, Hepatitis B surface antigen, HIV antibody, RPR, GC/Chlamydia Probe Amp, Ambulatory referral to Neurology, Ambulatory referral to Cardiology, Flu Vaccine QUAD 36+ mos IM, [place] Chest, CANCELED: [place] Misc Soft Tissue  Anxiety attack - Plan: sertraline (ZOLOFT) 50 MG tablet  Other specified hypothyroidism - Plan: SYNTHROID 100 MCG tablet  Here today with many concerns.   She continues to note a non- specific intermittent numbness and tingling in her limbs, now accompanied by vision changes and headaches. She would like to see  neurology about this.  Referred to [facility] neurology and encouraged her to keep this appt Also recommended that she have an eye exam She has complaint of chronic blood in stool- referral to GI She also has complaint of non specific chest discomfort/ heart palpitations.  Her father did die rather young of an undefined cardiac problem.  Will refer to cardiology for further evaluation BP is under good control, refilled medication for same Check TSH, refill synthroid Routine STI screening today Flu shot Urinary concern- will get urine culture  [place] of mass on her upper right back- suspect lipoma Her care is complicated by an undefined memory problem for which she was given aricept in the past- I do not think she is taking this any longer.  This may be why she misses appts and cannot give a very specific history, has trouble organizing her thoughts   Results for orders placed or performed in visit on 03/29/16  CBC  Result Value Ref Range   WBC 11.2 (H) 4.0 - 10.5 K/uL   RBC 5.13 (H) 3.87 - 5.11 Mil/uL   Platelets 240.0 150.0 - 400.0 K/uL   Hemoglobin 13.7 12.0 - 15.0 g/dL   HCT 41.9 36.0 - 46.0 %   MCV  81.8 78.0 - 100.0 fl   MCHC 32.7 30.0 - 36.0 g/dL   RDW 15.8 (H) 11.5 - 15.5 %  Comprehensive metabolic panel  Result Value Ref Range   Sodium 140 135 - 145 mEq/L   Potassium 3.5 3.5 - 5.1 mEq/L   Chloride 103 96 - 112 mEq/L   CO2 28 19 - 32 mEq/L   Glucose, Bld 83 70 - 99 mg/dL   BUN 10 6 - 23 mg/dL   Creatinine, Ser 0.75 0.40 - 1.20 mg/dL   Total Bilirubin 0.5 0.2 - 1.2 mg/dL   Alkaline Phosphatase 67 39 - 117 U/L   AST 17 0 - 37 U/L   ALT 10 0 - 35 U/L   Total Protein 8.2 6.0 - 8.3 g/dL   Albumin 4.5 3.5 - 5.2 g/dL   Calcium 9.8 8.4 - 10.5 mg/dL   GFR 101.72 >60.00 mL/min  Lipid panel  Result Value Ref Range   Cholesterol 163 0 - 200 mg/dL   Triglycerides 62.0 0.0 - 149.0 mg/dL   HDL 36.70 (L) >39.00 mg/dL   VLDL 12.4 0.0 - 40.0 mg/dL   LDL Cholesterol 114 (H) 0 - 99 mg/dL    Total CHOL/HDL Ratio 4    NonHDL 126.22   TSH  Result Value Ref Range   TSH 1.74 0.35 - 4.50 uIU/mL  Vitamin D (25 hydroxy)  Result Value Ref Range   VITD 22.44 (L) 30.00 - 100.00 ng/mL  Hemoglobin A1c  Result Value Ref Range   Hgb A1c MFr Bld 5.9 4.6 - 6.5 %     Signed Lamar Blinks, MD

## 2016-03-29 NOTE — Telephone Encounter (Signed)
Pt reminding at checkout that she needs all medications refilled 90 day supply please. [facility] #[id number] - [place], [place].

## 2016-03-30 LAB — HEPATITIS B SURFACE ANTIBODY, QUANTITATIVE: Hepatitis B-Post: 48.4 m[IU]/mL

## 2016-03-30 LAB — GC/CHLAMYDIA PROBE AMP
CT Probe RNA: NOT DETECTED
GC Probe RNA: NOT DETECTED

## 2016-03-30 LAB — URINE CULTURE: Organism ID, Bacteria: NO GROWTH

## 2016-03-30 LAB — HEPATITIS B SURFACE ANTIGEN: Hepatitis B Surface Ag: NEGATIVE

## 2016-03-30 LAB — RPR

## 2016-03-30 LAB — HIV ANTIBODY (ROUTINE TESTING W REFLEX): HIV 1&2 Ab, 4th Generation: NONREACTIVE

## 2016-03-31 ENCOUNTER — Telehealth: Payer: Self-pay | Admitting: Family Medicine

## 2016-03-31 NOTE — Telephone Encounter (Signed)
Patient is calling to get her lab results. Please advise  Patient phone: [phone]

## 2016-03-31 NOTE — Telephone Encounter (Signed)
Spoke to pt, informed her that some labs are still pending. Pt had questions about certain labs. Gave her the results that were available.

## 2016-04-03 ENCOUNTER — Ambulatory Visit (HOSPITAL_BASED_OUTPATIENT_CLINIC_OR_DEPARTMENT_OTHER): Admission: RE | Admit: 2016-04-03 | Payer: Medicare HMO | Source: Ambulatory Visit

## 2016-04-03 ENCOUNTER — Other Ambulatory Visit: Payer: Medicare HMO

## 2016-04-04 ENCOUNTER — Encounter: Payer: Self-pay | Admitting: Family Medicine

## 2016-04-04 DIAGNOSIS — Z789 Other specified health status: Secondary | ICD-10-CM | POA: Insufficient documentation

## 2016-04-04 NOTE — Telephone Encounter (Signed)
Patient phone: [phone]     Pt called in to see if additional lab results are back.

## 2016-04-04 NOTE — Progress Notes (Signed)
Results for orders placed or performed in visit on 03/29/16  Urine culture  Result Value Ref Range   Organism ID, Bacteria NO GROWTH   GC/Chlamydia Probe Amp  Result Value Ref Range   CT Probe RNA NOT DETECTED    GC Probe RNA NOT DETECTED   CBC  Result Value Ref Range   WBC 11.2 (H) 4.0 - 10.5 K/uL   RBC 5.13 (H) 3.87 - 5.11 Mil/uL   Platelets 240.0 150.0 - 400.0 K/uL   Hemoglobin 13.7 12.0 - 15.0 g/dL   HCT 41.9 36.0 - 46.0 %   MCV 81.8 78.0 - 100.0 fl   MCHC 32.7 30.0 - 36.0 g/dL   RDW 15.8 (H) 11.5 - 15.5 %  Comprehensive metabolic panel  Result Value Ref Range   Sodium 140 135 - 145 mEq/L   Potassium 3.5 3.5 - 5.1 mEq/L   Chloride 103 96 - 112 mEq/L   CO2 28 19 - 32 mEq/L   Glucose, Bld 83 70 - 99 mg/dL   BUN 10 6 - 23 mg/dL   Creatinine, Ser 0.75 0.40 - 1.20 mg/dL   Total Bilirubin 0.5 0.2 - 1.2 mg/dL   Alkaline Phosphatase 67 39 - 117 U/L   AST 17 0 - 37 U/L   ALT 10 0 - 35 U/L   Total Protein 8.2 6.0 - 8.3 g/dL   Albumin 4.5 3.5 - 5.2 g/dL   Calcium 9.8 8.4 - 10.5 mg/dL   GFR 101.72 >60.00 mL/min  Lipid panel  Result Value Ref Range   Cholesterol 163 0 - 200 mg/dL   Triglycerides 62.0 0.0 - 149.0 mg/dL   HDL 36.70 (L) >39.00 mg/dL   VLDL 12.4 0.0 - 40.0 mg/dL   LDL Cholesterol 114 (H) 0 - 99 mg/dL   Total CHOL/HDL Ratio 4    NonHDL 126.22   TSH  Result Value Ref Range   TSH 1.74 0.35 - 4.50 uIU/mL  Vitamin D (25 hydroxy)  Result Value Ref Range   VITD 22.44 (L) 30.00 - 100.00 ng/mL  Hemoglobin A1c  Result Value Ref Range   Hgb A1c MFr Bld 5.9 4.6 - 6.5 %  Hepatitis B surface antibody  Result Value Ref Range   Hepatitis B-Post 48.4 mIU/mL  Hepatitis B surface antigen  Result Value Ref Range   Hepatitis B Surface Ag NEGATIVE NEGATIVE  HIV antibody  Result Value Ref Range   HIV 1&2 Ab, 4th Generation NONREACTIVE NONREACTIVE  RPR  Result Value Ref Range   RPR Ser Ql NON REAC NON REAC

## 2016-04-05 ENCOUNTER — Other Ambulatory Visit: Payer: Self-pay | Admitting: Family Medicine

## 2016-04-05 NOTE — Telephone Encounter (Signed)
Called pt to discuss lab results. Pt would like to know if her liver test improved from last year.

## 2016-04-05 NOTE — Telephone Encounter (Signed)
Called pt to inform her of previous liver results.

## 2016-04-05 NOTE — Telephone Encounter (Signed)
Her liver results have been normal since 2015- they are about the same as last year, normal.

## 2016-04-07 ENCOUNTER — Ambulatory Visit (HOSPITAL_BASED_OUTPATIENT_CLINIC_OR_DEPARTMENT_OTHER): Payer: Medicare HMO

## 2016-04-08 ENCOUNTER — Ambulatory Visit (HOSPITAL_BASED_OUTPATIENT_CLINIC_OR_DEPARTMENT_OTHER): Payer: Medicare HMO

## 2016-04-12 ENCOUNTER — Ambulatory Visit (HOSPITAL_BASED_OUTPATIENT_CLINIC_OR_DEPARTMENT_OTHER): Payer: Medicare HMO

## 2016-04-19 ENCOUNTER — Ambulatory Visit (HOSPITAL_BASED_OUTPATIENT_CLINIC_OR_DEPARTMENT_OTHER): Payer: Medicare HMO

## 2016-04-20 NOTE — Telephone Encounter (Signed)
Patient returning call.

## 2016-04-20 NOTE — Telephone Encounter (Signed)
Tried to contact pt. No answer, left voicemail asking pt to call back to schedule an appt with Dr. Lorelei Pont to have forms completed at her earliest convenience.

## 2016-04-24 ENCOUNTER — Ambulatory Visit: Payer: Medicare HMO | Admitting: Cardiology

## 2016-05-05 ENCOUNTER — Ambulatory Visit: Payer: Medicare HMO | Admitting: Cardiology

## 2016-05-16 ENCOUNTER — Ambulatory Visit: Payer: Medicare HMO | Admitting: Gastroenterology

## 2016-05-21 ENCOUNTER — Other Ambulatory Visit: Payer: Self-pay | Admitting: Family Medicine

## 2016-05-23 ENCOUNTER — Telehealth: Payer: Self-pay | Admitting: Family Medicine

## 2016-05-23 NOTE — Telephone Encounter (Signed)
Attempted to contact patient to reschedule appointment scheduled for 2/15 with Dr. Lorelei Pont. Patient does not have voicemail. Could not notify patient of appointment cancellation.

## 2016-06-01 ENCOUNTER — Ambulatory Visit: Payer: Medicare HMO | Admitting: Family Medicine

## 2016-06-02 ENCOUNTER — Ambulatory Visit: Payer: Medicare HMO | Admitting: Neurology

## 2016-08-11 NOTE — Telephone Encounter (Signed)
Done

## 2016-08-25 ENCOUNTER — Other Ambulatory Visit: Payer: Self-pay | Admitting: Family Medicine

## 2016-08-25 MED ORDER — METOPROLOL SUCCINATE ER 50 MG PO TB24: 50.0000 mg | ORAL_TABLET | Freq: Every day | ORAL | 0 refills | Status: DC

## 2016-12-01 ENCOUNTER — Ambulatory Visit: Payer: Medicare HMO | Admitting: Emergency Medicine

## 2016-12-16 ENCOUNTER — Other Ambulatory Visit: Payer: Self-pay | Admitting: Family Medicine

## 2017-01-18 ENCOUNTER — Ambulatory Visit (INDEPENDENT_AMBULATORY_CARE_PROVIDER_SITE_OTHER): Payer: Medicare HMO | Admitting: Physician Assistant

## 2017-01-18 ENCOUNTER — Encounter: Payer: Self-pay | Admitting: Physician Assistant

## 2017-01-18 ENCOUNTER — Ambulatory Visit (INDEPENDENT_AMBULATORY_CARE_PROVIDER_SITE_OTHER): Payer: Medicare HMO

## 2017-01-18 VITALS — BP 130/82 | HR 74 | Temp 98.3°F | Resp 16 | Ht 67.0 in | Wt 217.6 lb

## 2017-01-18 DIAGNOSIS — Z23 Encounter for immunization: Secondary | ICD-10-CM | POA: Diagnosis not present

## 2017-01-18 DIAGNOSIS — L719 Rosacea, unspecified: Secondary | ICD-10-CM

## 2017-01-18 DIAGNOSIS — R600 Localized edema: Secondary | ICD-10-CM

## 2017-01-18 LAB — POCT CBC
Granulocyte percent: 63.3 %G (ref 37–80)
HCT, POC: 40 % (ref 37.7–47.9)
Hemoglobin: 13 g/dL (ref 12.2–16.2)
Lymph, poc: 3.4 (ref 0.6–3.4)
MCH, POC: 26.8 pg — AB (ref 27–31.2)
MCHC: 32.5 g/dL (ref 31.8–35.4)
MCV: 82.5 fL (ref 80–97)
MID (cbc): 0.3 (ref 0–0.9)
MPV: 9.4 fL (ref 0–99.8)
POC Granulocyte: 6.4 (ref 2–6.9)
POC LYMPH PERCENT: 33.8 %L (ref 10–50)
POC MID %: 2.9 %M (ref 0–12)
Platelet Count, POC: 209 10*3/uL (ref 142–424)
RBC: 4.84 M/uL (ref 4.04–5.48)
RDW, POC: 15.3 %
WBC: 10.1 10*3/uL (ref 4.6–10.2)

## 2017-01-18 MED ORDER — POTASSIUM CHLORIDE ER 10 MEQ PO TBCR: 10.0000 meq | EXTENDED_RELEASE_TABLET | Freq: Every day | ORAL | 1 refills | Status: DC

## 2017-01-18 MED ORDER — CLINDAMYCIN PHOS-BENZOYL PEROX 1-5 % EX GEL: Freq: Two times a day (BID) | CUTANEOUS | 6 refills | Status: DC

## 2017-01-18 MED ORDER — FUROSEMIDE 20 MG PO TABS: 20.0000 mg | ORAL_TABLET | Freq: Every day | ORAL | 1 refills | Status: DC

## 2017-01-18 NOTE — Patient Instructions (Signed)
     IF you received an x-ray today, you will receive an invoice from [facility]. Please contact [facility] at [phone] with questions or concerns regarding your invoice.   IF you received labwork today, you will receive an invoice from [facility]. Please contact [facility] at [phone] with questions or concerns regarding your invoice.   Our billing staff will not be able to assist you with questions regarding bills from these companies.  You will be contacted with the lab results as soon as they are available. The fastest way to get your results is to activate your My Chart account. Instructions are located on the last page of this paperwork. If you have not heard from us regarding the results in 2 weeks, please contact this office.     

## 2017-01-18 NOTE — Progress Notes (Signed)
01/18/2017 5:52 PM   DOB: 26-Apr-1955 / MRN: [id number]  SUBJECTIVE:  Robyn Montes is a 61 y.o. female presenting for bilateral leg swelling that has been present for over 4 months and worse in the last month. The swelling is much better in the morning.  Denies a history of cellulitis. ECHO in 10/16/13 by Dr. Gwenlyn Found which showed grade one diastolic dysfunction. No estrogen use.  Never smoker and no history of DVT/PE.    She is allergic to bee venom.   She  has a past medical history of Anemia (1976, 1984, 2001, 2003); Arthritis; Blood transfusion without reported diagnosis; Family history of heart disease; Heart murmur; Hepatitis C (04/17/2013); HTN (hypertension); Hypothyroid; Memory loss (08/21/2014); Palpitations; and Shortness of breath.    She  reports that she has never smoked. She has never used smokeless tobacco. She reports that she does not drink alcohol or use drugs. She  has no sexual activity history on file. The patient  has a past surgical history that includes Liver biopsy.  Her family history includes Diabetes in her brother, brother, and mother; Heart disease in her brother, father, and mother; Hyperlipidemia in her father and mother; Hypertension in her brother, mother, and sister; Stroke in her mother.  Review of Systems  Constitutional: Negative for chills, diaphoresis and fever.  Respiratory: Negative for cough and shortness of breath.   Cardiovascular: Positive for leg swelling. Negative for chest pain, palpitations, orthopnea, claudication and PND.  Gastrointestinal: Negative for nausea.  Skin: Negative for rash.  Neurological: Negative for dizziness.  Psychiatric/Behavioral: Negative for depression.    The problem list and medications were reviewed and updated by myself where necessary and exist elsewhere in the encounter.   OBJECTIVE:  BP 130/82 (BP Location: Left Arm, Patient Position: Sitting, Cuff Size: Large)   Pulse 74   Temp 98.3 F (36.8 C) (Oral)    Resp 16   Ht 5\' 7"  (1.702 m)   Wt 217 lb 9.6 oz (98.7 kg)   SpO2 98%   BMI 34.08 kg/m   BP Readings from Last 3 Encounters:  01/18/17 130/82  03/29/16 124/82  05/19/15 131/81    Physical Exam  Constitutional: She is active.  Non-toxic appearance.  Cardiovascular: Normal rate, regular rhythm, S1 normal, S2 normal, normal heart sounds and intact distal pulses.  Exam reveals no gallop, no friction rub and no decreased pulses.   No murmur heard. Pulmonary/Chest: Effort normal. No tachypnea. She has no rales.  Abdominal: She exhibits no distension.  Musculoskeletal: She exhibits edema (trace edema bilaterally). She exhibits no tenderness (- Homan sign bilaterally. ) or deformity.  Neurological: She is alert.  Skin: Skin is warm and dry. She is not diaphoretic. No pallor.     Lab Results  Component Value Date   CREATININE 0.75 03/29/2016   BUN 10 03/29/2016   NA 140 03/29/2016   K 3.5 03/29/2016   CL 103 03/29/2016   CO2 28 03/29/2016   Lab Results  Component Value Date   WBC 10.1 01/18/2017   HGB 13.0 01/18/2017   HCT 40.0 01/18/2017   MCV 82.5 01/18/2017   PLT 240.0 03/29/2016   Wt Readings from Last 3 Encounters:  01/18/17 217 lb 9.6 oz (98.7 kg)  03/29/16 208 lb 9.6 oz (94.6 kg)  05/19/15 211 lb 12.8 oz (96.1 kg)      Results for orders placed or performed in visit on 01/18/17 (from the past 72 hour(s))  POCT CBC  Status: Abnormal   Collection Time: 01/18/17  5:35 PM  Result Value Ref Range   WBC 10.1 4.6 - 10.2 K/uL   Lymph, poc 3.4 0.6 - 3.4   POC LYMPH PERCENT 33.8 10 - 50 %L   MID (cbc) 0.3 0 - 0.9   POC MID % 2.9 0 - 12 %M   POC Granulocyte 6.4 2 - 6.9   Granulocyte percent 63.3 37 - 80 %G   RBC 4.84 4.04 - 5.48 M/uL   Hemoglobin 13.0 12.2 - 16.2 g/dL   HCT, POC 40.0 37.7 - 47.9 %   MCV 82.5 80 - 97 fL   MCH, POC 26.8 (A) 27 - 31.2 pg   MCHC 32.5 31.8 - 35.4 g/dL   RDW, POC 15.3 %   Platelet Count, POC 209 142 - 424 K/uL   MPV 9.4 0 - 99.8 fL     Dg Chest 2 View  Result Date: 01/18/2017 CLINICAL DATA:  Bilateral leg swelling EXAM: CHEST  2 VIEW COMPARISON:  12/08/2013 FINDINGS: The heart size and mediastinal contours are within normal limits. Both lungs are clear. The visualized skeletal structures are unremarkable. IMPRESSION: No active cardiopulmonary disease. Electronically Signed   By: Donavan Foil M.D.   On: 01/18/2017 17:49    ASSESSMENT AND PLAN:  Myrian was seen today for foot swelling.  Diagnoses and all orders for this visit:  Bilateral leg edema: History of grade one diastolic function with health kidneys.  Will add some lasix and see her back in about 3 weeks to assess progress and re-lab.   -     DG Chest 2 View; Future -     POCT CBC -     Basic Metabolic Panel -     Brain natriuretic peptide -     furosemide (LASIX) 20 MG tablet; Take 1 tablet (20 mg total) by mouth daily. -     potassium chloride (K-DUR) 10 MEQ tablet; Take 1 tablet (10 mEq total) by mouth daily.  Rosacea -     clindamycin-benzoyl peroxide (BENZACLIN) gel; Apply topically 2 (two) times daily.  Needs flu shot -     Flu Vaccine QUAD 36+ mos IM    The patient is advised to call or return to clinic if she does not see an improvement in symptoms, or to seek the care of the closest emergency department if she worsens with the above plan.   Philis Fendt, MHS, PA-C [facility] 01/18/2017 5:52 PM

## 2017-01-19 LAB — BASIC METABOLIC PANEL
BUN/Creatinine Ratio: 15 (ref 9–23)
BUN: 10 mg/dL (ref 6–24)
CO2: 24 mmol/L (ref 20–29)
Calcium: 9.6 mg/dL (ref 8.7–10.2)
Chloride: 106 mmol/L (ref 96–106)
Creatinine, Ser: 0.68 mg/dL (ref 0.57–1.00)
GFR calc Af Amer: 111 mL/min/{1.73_m2} (ref >59)
GFR calc non Af Amer: 96 mL/min/{1.73_m2} (ref >59)
Glucose: 86 mg/dL (ref 65–99)
Potassium: 4.3 mmol/L (ref 3.5–5.2)
Sodium: 143 mmol/L (ref 134–144)

## 2017-01-19 LAB — BRAIN NATRIURETIC PEPTIDE: BNP: 17.6 pg/mL (ref 0.0–100.0)

## 2017-02-05 ENCOUNTER — Ambulatory Visit: Payer: Medicare HMO | Admitting: Physician Assistant

## 2017-02-05 IMAGING — MR MR KNEE*R* W/O CM
4 of 5 series · 19 of 40 positions shown · non-contrast
Comparison: None.

CLINICAL DATA: Chronic right knee pain and swelling particularly
laterally.

EXAM:
MRI OF THE RIGHT KNEE WITHOUT CONTRAST
TECHNIQUE: Multiplanar, multisequence MR imaging of the knee was performed. No
intravenous contrast was administered.

[Series 3: pd_tse_fs_tra · axial · 3.5mm · 0.39mm/px · z∈[-18,+44]mm · 3 of 24 slices shown]
[im 4/24]
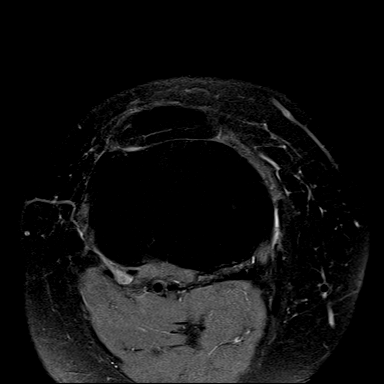
[im 12/24]
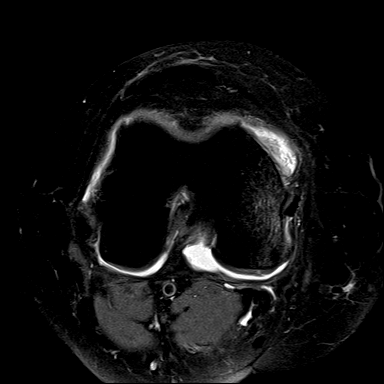
[im 20/24]
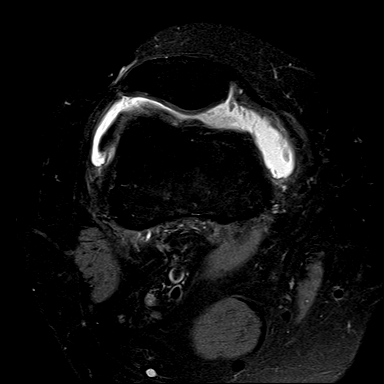

[Series 5: T2 fat-sat · coronal · 3.0mm · 0.29mm/px · 7 of 30 slices shown]
[im 1/30]
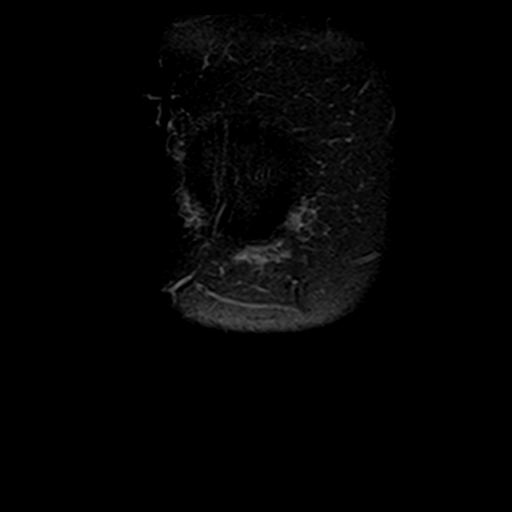
[im 4/30]
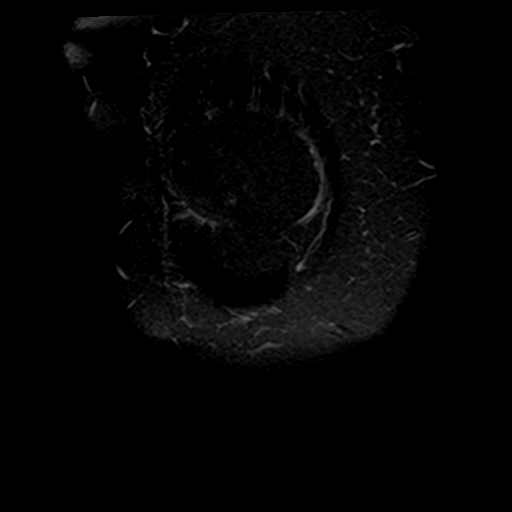
[im 8/30]
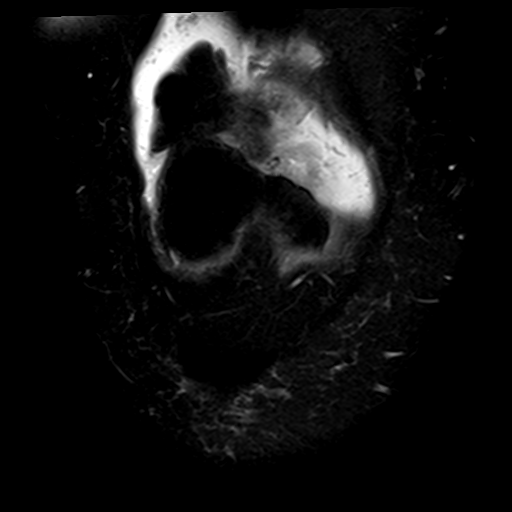
[im 11/30]
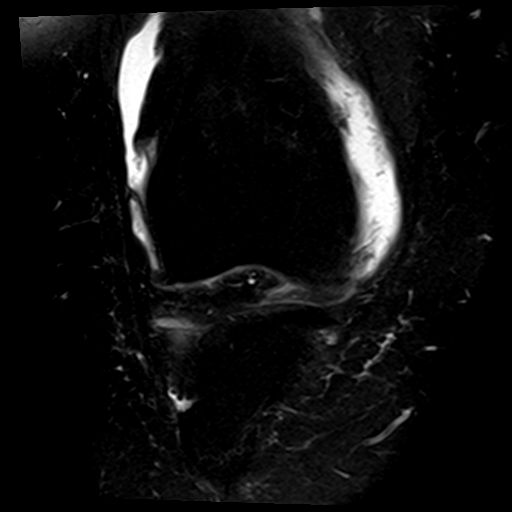
[im 15/30]
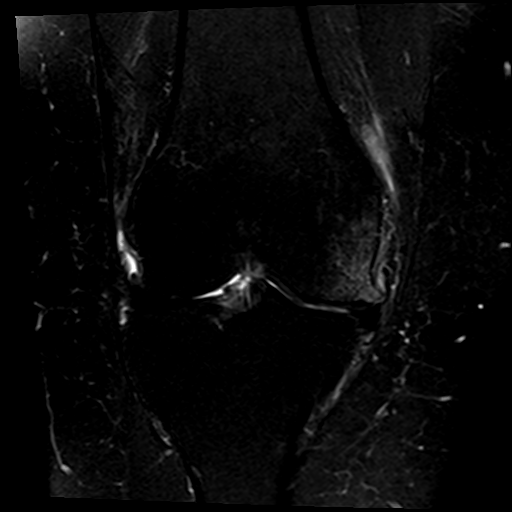
[im 19/30]
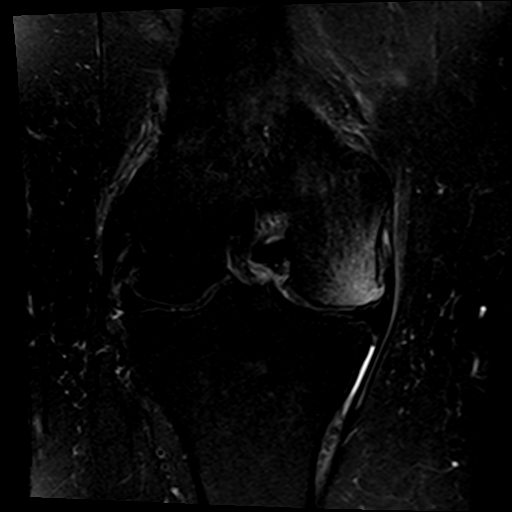
[im 26/30]
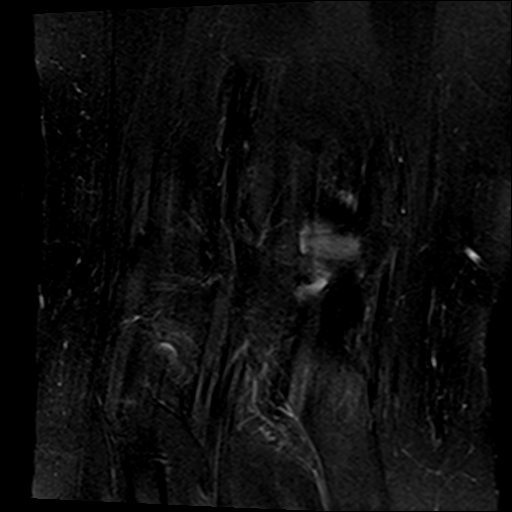

[Series 6: T1 · coronal · 3.0mm · 0.23mm/px · 3 of 30 slices shown]
[im 4/30]
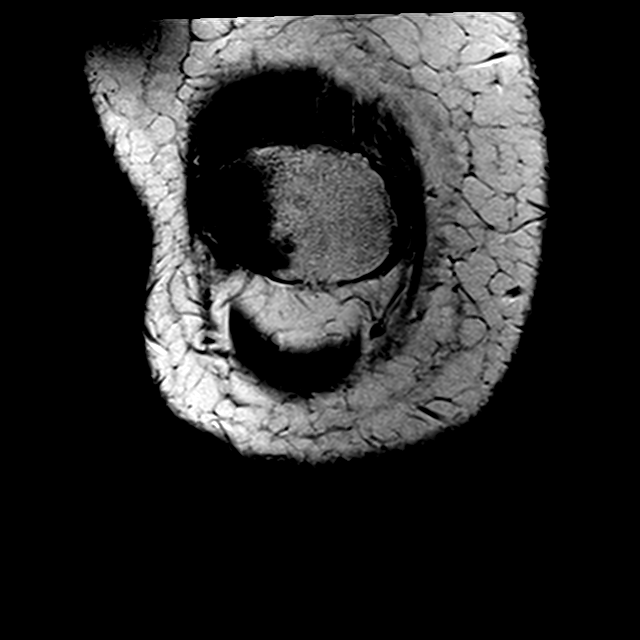
[im 15/30]
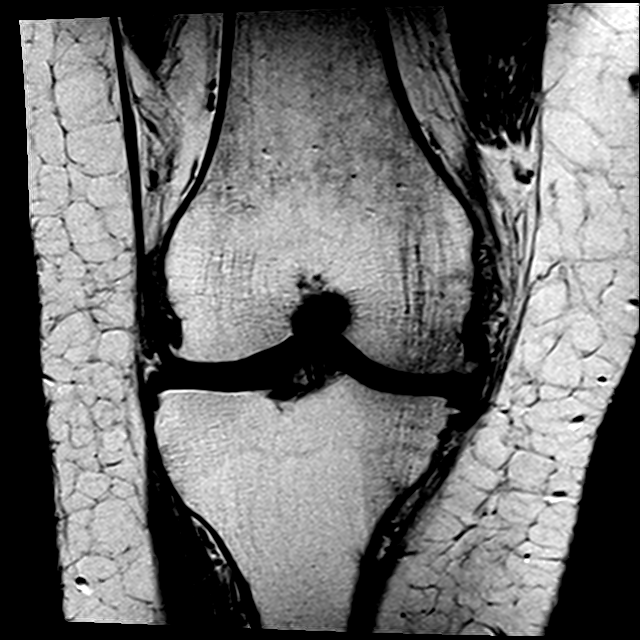
[im 26/30]
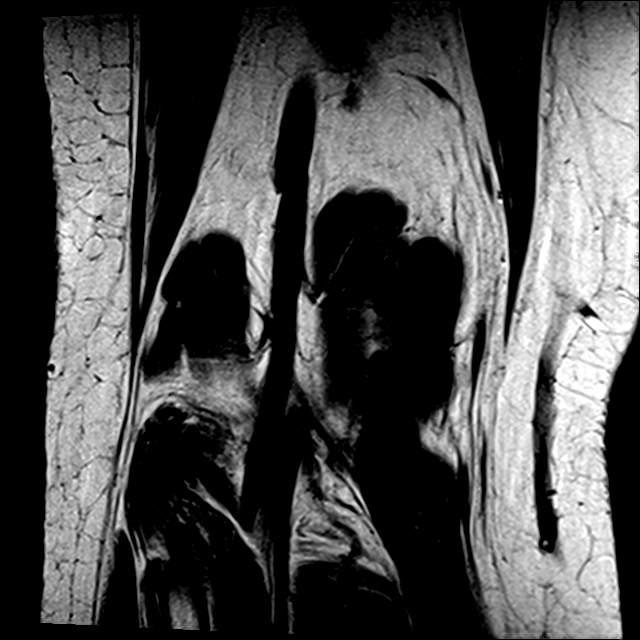

[Series 7: PD fat-sat · sagittal · 4.0mm · 0.31mm/px · 6 of 22 slices shown]
[im 1/22]
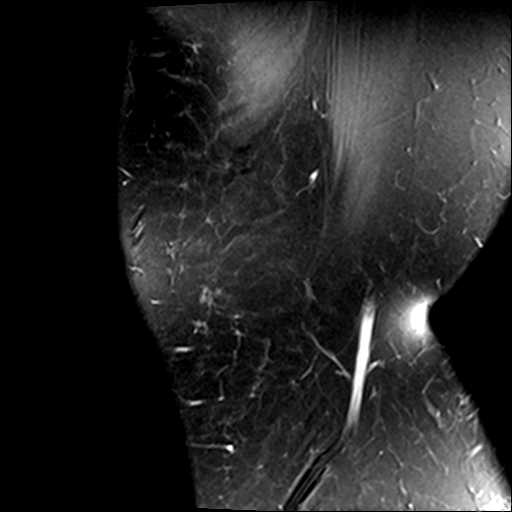
[im 5/22]
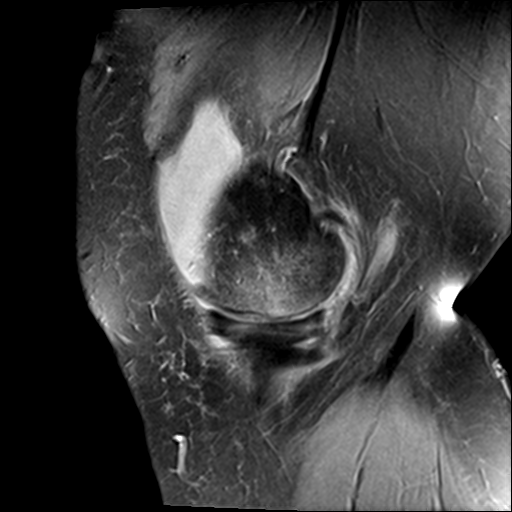
[im 9/22]
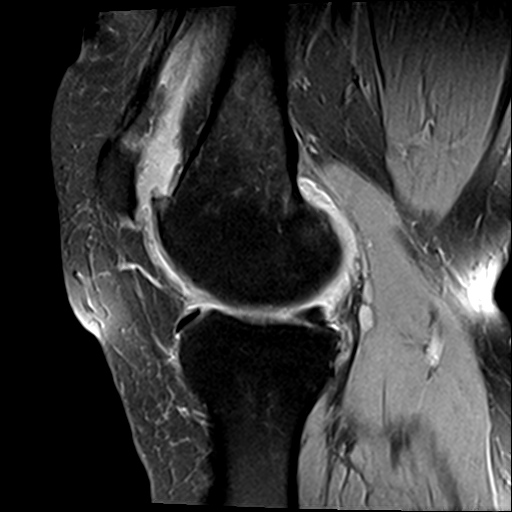
[im 13/22]
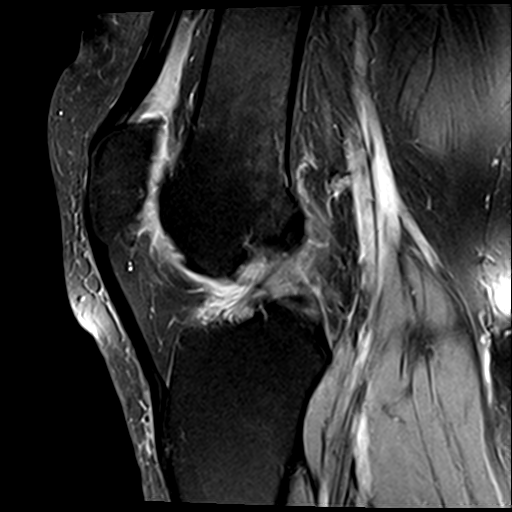
[im 17/22]
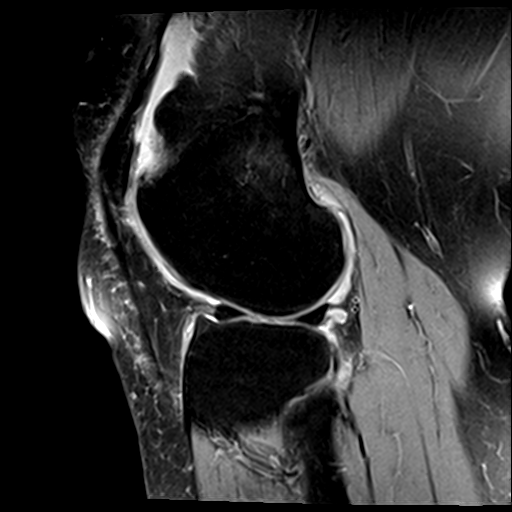
[im 22/22]
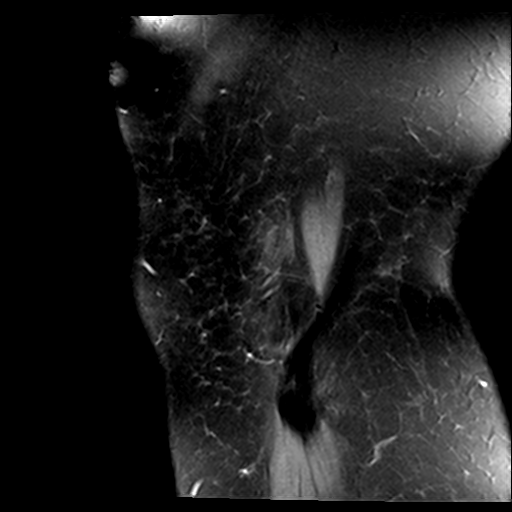

[19 of 40 positions shown; findings below may reference images not displayed]

FINDINGS: MENISCI

Medial meniscus: There is a horizontal tear of the posterior horn at
the posterior medial corner with peripheral subluxation of the
anterior horn and midbody.

Lateral meniscus: Degeneration of the free edge of the lateral
meniscus without a tear.

LIGAMENTS

Cruciates:  Normal.

Collaterals:  Normal.

CARTILAGE

Patellofemoral: Extensive grade 2-3 chondromalacia of the patella.
Focal areas of grade 2-3 chondromalacia of the medial aspect of the
trochlear groove of the distal femur.

Medial: Diffuse marked thinning of the cartilage with prominent
subcortical edema of the peripheral aspect of the condyle. Focal
grade 4 chondromalacia of the posterior aspect of the condyle.

Lateral: Focal areas of full-thickness cartilage loss in the
posterior medial aspect of the tibial plateau.

Joint:  Moderate joint effusion with debris in the joint.

Popliteal Fossa:  Tiny Baker's cyst.

Extensor Mechanism:  Normal.

Bones: Tricompartmental marginal osteophytes, most prominent in the
medial compartment.
IMPRESSION: 1. Horizontal tear of the posterior horn of the medial meniscus.
2. Osteoarthritis of the medial compartment with marked joint space
narrowing with secondary peripheral subluxation of the degenerated
meniscus.
3. Joint effusion.
4. Extensive chondromalacia of the patella.

## 2017-02-21 ENCOUNTER — Ambulatory Visit: Payer: Medicare HMO | Admitting: Physician Assistant

## 2017-02-22 ENCOUNTER — Encounter: Payer: Medicare HMO | Admitting: Family Medicine

## 2017-02-22 ENCOUNTER — Other Ambulatory Visit: Payer: Self-pay | Admitting: Family Medicine

## 2017-02-22 ENCOUNTER — Other Ambulatory Visit (HOSPITAL_COMMUNITY)
Admission: RE | Admit: 2017-02-22 | Discharge: 2017-02-22 | Disposition: A | Discharge status: Home / Self Care | Payer: Medicare HMO | Source: Ambulatory Visit | Attending: Family Medicine | Admitting: Family Medicine

## 2017-02-22 ENCOUNTER — Ambulatory Visit (INDEPENDENT_AMBULATORY_CARE_PROVIDER_SITE_OTHER): Payer: Medicare HMO | Admitting: Family Medicine

## 2017-02-22 ENCOUNTER — Encounter: Payer: Self-pay | Admitting: Family Medicine

## 2017-02-22 VITALS — BP 122/82 | HR 82 | Temp 98.5°F | Ht 66.0 in | Wt 214.6 lb

## 2017-02-22 DIAGNOSIS — E785 Hyperlipidemia, unspecified: Secondary | ICD-10-CM

## 2017-02-22 DIAGNOSIS — E039 Hypothyroidism, unspecified: Secondary | ICD-10-CM

## 2017-02-22 DIAGNOSIS — D171 Benign lipomatous neoplasm of skin and subcutaneous tissue of trunk: Secondary | ICD-10-CM | POA: Diagnosis not present

## 2017-02-22 DIAGNOSIS — Z1231 Encounter for screening mammogram for malignant neoplasm of breast: Secondary | ICD-10-CM

## 2017-02-22 DIAGNOSIS — Z124 Encounter for screening for malignant neoplasm of cervix: Secondary | ICD-10-CM

## 2017-02-22 DIAGNOSIS — Z5181 Encounter for therapeutic drug level monitoring: Secondary | ICD-10-CM

## 2017-02-22 DIAGNOSIS — R7303 Prediabetes: Secondary | ICD-10-CM | POA: Diagnosis not present

## 2017-02-22 DIAGNOSIS — Z0000 Encounter for general adult medical examination without abnormal findings: Secondary | ICD-10-CM

## 2017-02-22 DIAGNOSIS — Z13220 Encounter for screening for lipoid disorders: Secondary | ICD-10-CM

## 2017-02-22 NOTE — Progress Notes (Addendum)
[place] [facility] [address] [place], [place] [address] [phone]  Date:  02/22/2017   Name:  Robyn Montes   DOB:  05-02-1955   MRN:  [id number]  PCP:  Darreld Mclean, MD    Chief Complaint: Annual Exam (Pt here for CPE wihy )   History of Present Illness:  Robyn Montes is a 61 y.o. very pleasant female patient who presents with the following:  Here today for a CPE. I last saw her about a year ago, and she saw Philis Fendt at [place] a month ago for LE edema History of hypothyroidism, hep C, HTN, anemia, memory loss, pre-diabetes   Pap: 4/16, will do today Mammo: 6/17, she is due for a mammogram and plans to schedule while she is here [place] Colon:6/16 Labs: complete last December, she had a BMP and CBC just recently   Lab Results  Component Value Date   TSH 1.74 03/29/2016   She needs bed liners for urinary leakage during the night- there were supposed to be some forms sent to me but I did not get these yet. She will look into this She also has noted that the longstanding lump on her back seems to be getting larger, and would like to possibly have this removed.  It bothers her and is visible through her clothing. She would like to consult with gen surgery   Lab Results  Component Value Date   HGBA1C 5.9 03/29/2016   She is doing fine with the metformin, she does not really notice any SE from this medication and does not mind taking it She has a few skin tags on her neck - might like to have thee removed at some point menopausal Patient Active Problem List   Diagnosis Date Noted  . Hepatitis B immune 04/04/2016  . Hypothyroidism 10/01/2014  . Memory loss 08/21/2014  . Chronic hepatitis C ([place]) 08/04/2014  . History of other specified conditions presenting hazards to health 08/04/2014  . Palpitations 10/03/2013  . Essential hypertension 10/03/2013  . Biliary calculi 01/17/2013    Past Medical  History:  Diagnosis Date  . Anemia 1976, 1984, 2001, 2003   history of multiple transfusions (3); iron deficiency  . Arthritis    hands, knees B; DDD lumbar  . Blood transfusion without reported diagnosis   . Family history of heart disease   . Heart murmur   . Hepatitis C 04/17/2013   acquired from blood transfusion; s/p [facility] in [place].  [place] HTN (hypertension)   . Hypothyroid   . Memory loss 08/21/2014  . Palpitations   . Shortness of breath     Past Surgical History:  Procedure Laterality Date  . LIVER BIOPSY      Social History   Tobacco Use  . Smoking status: Never Smoker  . Smokeless tobacco: Never Used  Substance Use Topics  . Alcohol use: No  . Drug use: No    Family History  Problem Relation Age of Onset  . Heart disease Mother        heart attack and stroke; passed away in her 77s  . Diabetes Mother   . Hyperlipidemia Mother   . Hypertension Mother   . Stroke Mother   . Heart disease Father        passed away in his 32s  . Hyperlipidemia Father   . Diabetes Brother   . Heart disease Brother   .  Hypertension Brother   . Diabetes Brother   . Hypertension Sister     Allergies  Allergen Reactions  . Bee Venom     Medication list has been reviewed and updated.  Current Outpatient Medications on File Prior to Visit  Medication Sig Dispense Refill  . Cholecalciferol 2000 UNITS TABS Take by mouth.    . clindamycin-benzoyl peroxide (BENZACLIN) gel Apply topically 2 (two) times daily. 25 g 6  . donepezil (ARICEPT) 10 MG tablet Take 1 tablet (10 mg total) by mouth at bedtime. May increase to 2 pills after 4 weeks 30 tablet 5  . EPINEPHrine (EPIPEN 2-PAK) 0.3 mg/0.3 mL IJ SOAJ injection USE AS DIRECTED 2 Device 1  . furosemide (LASIX) 20 MG tablet Take 1 tablet (20 mg total) by mouth daily. 30 tablet 1  . losartan-hydrochlorothiazide (HYZAAR) 50-12.5 MG tablet Take 1 tablet by mouth 2 (two) times daily. 180 tablet 3  . metFORMIN  (GLUCOPHAGE) 500 MG tablet Take 1 tablet (500 mg total) by mouth daily with breakfast. 90 tablet 3  . metoprolol succinate (TOPROL-XL) 50 MG 24 hr tablet Take 1 tablet (50 mg total) by mouth daily. Take with or immediately following a meal. 30 tablet 0  . potassium chloride (K-DUR) 10 MEQ tablet Take 1 tablet (10 mEq total) by mouth daily. 30 tablet 1  . sertraline (ZOLOFT) 50 MG tablet Take 1 tablet (50 mg total) by mouth daily. 90 tablet 3  . SYNTHROID 100 MCG tablet Take 1 tablet (100 mcg total) by mouth daily. 90 tablet 3  . Tetrahydroz-Glyc-Hyprom-PEG (VISINE MAXIMUM REDNESS RELIEF) 0.05-0.2-0.36-1 % SOLN Apply 2-3 drops to eye daily as needed (for eye redness).    . Vitamin D, Ergocalciferol, (DRISDOL) 50000 units CAPS capsule TAKE ONE CAPSULE BY MOUTH ONCE A WEEK 12 capsule 0   No current facility-administered medications on file prior to visit.     Review of Systems:  As per HPI- otherwise negative. No fever or chlls No CP or SOB BP is ok Mood is good   Physical Examination: Vitals:   02/22/17 1728  BP: 122/82  Pulse: 82  Temp: 98.5 F (36.9 C)  SpO2: 98%   Vitals:   02/22/17 1728  Weight: 214 lb 9.6 oz (97.3 kg)  Height: 5\' 6"  (1.676 m)   Body mass index is 34.64 kg/m. Ideal Body Weight: Weight in (lb) to have BMI = 25: 154.6  GEN: WDWN, NAD, Non-toxic, A & O x 3, looks well, obese HEENT: Atraumatic, Normocephalic. Neck supple. No masses, No LAD.  Bilateral TM wnl, oropharynx normal.  PEERL,EOMI.   Ears and Nose: No external deformity. CV: RRR, No M/G/R. No JVD. No thrill. No extra heart sounds. PULM: CTA B, no wheezes, crackles, rhonchi. No retractions. No resp. distress. No accessory muscle use. ABD: S, NT, ND, +BS. No rebound. No HSM. EXTR: No c/c/e NEURO Normal gait.  PSYCH: Normally interactive. Conversant. Not depressed or anxious appearing.  Calm demeanor.  Pelvic: normal, no vaginal lesions or discharge. Uterus normal, no CMT, no adnexal tendereness or  masses 1/2 grapefruit size likely lipoma on her right back, at the bra line  Assessment and Plan: Physical exam  Screening for cervical cancer - Plan: Cytology - PAP  Pre-diabetes - Plan: Hemoglobin A1c  Medication monitoring encounter - Plan: Hepatic function panel  Acquired hypothyroidism - Plan: TSH  Screening for hyperlipidemia - Plan: Lipid panel  Lipoma of torso - Plan: Ambulatory referral to General Surgery  Here today for a CPE  Labs pending as above Pap Referral to general surgery to eval lipoma- she would like to consider having this removed surgically  Signed Lamar Blinks, MD  Received her labs today 11/13 and gave her a call. Would like to start a statin to increase her HDL, she is ok with this.  Ascus pap with - HPV explained.  Will send her labs online as well  Results for orders placed or performed in visit on 02/22/17  Hemoglobin A1c  Result Value Ref Range   Hgb A1c MFr Bld 6.0 4.6 - 6.5 %  Lipid panel  Result Value Ref Range   Cholesterol 173 0 - 200 mg/dL   Triglycerides 142.0 0.0 - 149.0 mg/dL   HDL 37.20 (L) >39.00 mg/dL   VLDL 28.4 0.0 - 40.0 mg/dL   LDL Cholesterol 107 (H) 0 - 99 mg/dL   Total CHOL/HDL Ratio 5    NonHDL 135.41   Hepatic function panel  Result Value Ref Range   Total Bilirubin 0.5 0.2 - 1.2 mg/dL   Bilirubin, Direct 0.1 0.0 - 0.3 mg/dL   Alkaline Phosphatase 66 39 - 117 U/L   AST 20 0 - 37 U/L   ALT 11 0 - 35 U/L   Total Protein 8.3 6.0 - 8.3 g/dL   Albumin 4.4 3.5 - 5.2 g/dL  TSH  Result Value Ref Range   TSH 2.01 0.35 - 4.50 uIU/mL  Cytology - PAP  Result Value Ref Range   Adequacy (A)     Satisfactory for evaluation  endocervical/transformation zone component PRESENT.   Diagnosis (A)     ATYPICAL SQUAMOUS CELLS OF UNDETERMINED SIGNIFICANCE (ASC-US).   HPV NOT DETECTED    Material Submitted CervicoVaginal Pap [ThinPrep Imaged] (A)     She has ASCUS but negative HPV

## 2017-02-22 NOTE — Patient Instructions (Signed)
It was nice to see you today- please go to the lab and get your blood drawn. Then, you can stop by the imaging dept on the ground floor to schedule your mammogram I will arrange for you to see a general surgeon about the mass on your back- I do not think this is anything dangerous or cancerous, but it can be removed if it bothers you

## 2017-02-23 ENCOUNTER — Telehealth: Payer: Self-pay | Admitting: Family Medicine

## 2017-02-23 LAB — HEMOGLOBIN A1C: Hgb A1c MFr Bld: 6 % (ref 4.6–6.5)

## 2017-02-23 LAB — HEPATIC FUNCTION PANEL
ALT: 11 U/L (ref 0–35)
AST: 20 U/L (ref 0–37)
Albumin: 4.4 g/dL (ref 3.5–5.2)
Alkaline Phosphatase: 66 U/L (ref 39–117)
Bilirubin, Direct: 0.1 mg/dL (ref 0.0–0.3)
Total Bilirubin: 0.5 mg/dL (ref 0.2–1.2)
Total Protein: 8.3 g/dL (ref 6.0–8.3)

## 2017-02-23 LAB — LIPID PANEL
Cholesterol: 173 mg/dL (ref 0–200)
HDL: 37.2 mg/dL — ABNORMAL LOW (ref >39.00)
LDL Cholesterol: 107 mg/dL — ABNORMAL HIGH (ref 0–99)
NonHDL: 135.41
Total CHOL/HDL Ratio: 5
Triglycerides: 142 mg/dL (ref 0.0–149.0)
VLDL: 28.4 mg/dL (ref 0.0–40.0)

## 2017-02-23 LAB — TSH: TSH: 2.01 u[IU]/mL (ref 0.35–4.50)

## 2017-02-23 NOTE — Telephone Encounter (Signed)
Pt called in to follow up. She said that PCP should be receiving some paperwork for her. Pt would like to know if someone could let her know once received.

## 2017-02-26 NOTE — Telephone Encounter (Signed)
Pt called in to inquire about the paperwork from [employer] and would like to be called when the paperwork is completed  [phone]

## 2017-02-26 NOTE — Telephone Encounter (Signed)
Received Physician Orders from [place]; forwarded to provider/SLS 11/12

## 2017-02-26 NOTE — Telephone Encounter (Signed)
Got paperwork, filled out and put in fax bin. Called pt and let her know

## 2017-02-27 ENCOUNTER — Ambulatory Visit (HOSPITAL_BASED_OUTPATIENT_CLINIC_OR_DEPARTMENT_OTHER): Payer: Medicare HMO

## 2017-02-27 ENCOUNTER — Encounter: Payer: Self-pay | Admitting: Family Medicine

## 2017-02-27 LAB — CYTOLOGY - PAP
Diagnosis: UNDETERMINED — AB
HPV: NOT DETECTED

## 2017-02-27 MED ORDER — LOVASTATIN 20 MG PO TABS: 20.0000 mg | ORAL_TABLET | Freq: Every day | ORAL | 3 refills | Status: DC

## 2017-02-27 NOTE — Telephone Encounter (Signed)
Paperwork faxed. Fax confirmation received.

## 2017-02-27 NOTE — Addendum Note (Signed)
Addended by: Lamar Blinks C on: 02/27/2017 04:55 PM   Modules accepted: Orders

## 2017-03-06 ENCOUNTER — Ambulatory Visit (HOSPITAL_BASED_OUTPATIENT_CLINIC_OR_DEPARTMENT_OTHER)
Admission: RE | Admit: 2017-03-06 | Discharge: 2017-03-06 | Disposition: A | Discharge status: Home / Self Care | Payer: Medicare HMO | Source: Ambulatory Visit | Attending: Family Medicine | Admitting: Family Medicine

## 2017-03-06 ENCOUNTER — Encounter (HOSPITAL_BASED_OUTPATIENT_CLINIC_OR_DEPARTMENT_OTHER): Payer: Self-pay

## 2017-03-06 ENCOUNTER — Ambulatory Visit (HOSPITAL_BASED_OUTPATIENT_CLINIC_OR_DEPARTMENT_OTHER): Payer: Medicare HMO

## 2017-03-06 DIAGNOSIS — Z1231 Encounter for screening mammogram for malignant neoplasm of breast: Secondary | ICD-10-CM | POA: Diagnosis present

## 2017-03-31 ENCOUNTER — Other Ambulatory Visit: Payer: Self-pay | Admitting: Family Medicine

## 2017-04-09 ENCOUNTER — Ambulatory Visit: Payer: Self-pay | Admitting: *Deleted

## 2017-04-09 ENCOUNTER — Other Ambulatory Visit: Payer: Self-pay | Admitting: Family Medicine

## 2017-04-09 ENCOUNTER — Telehealth: Payer: Self-pay | Admitting: Family Medicine

## 2017-04-09 NOTE — Telephone Encounter (Signed)
Copied from [place] [phone]. Topic: Quick Communication - Rx Refill/Question >> Apr 09, 2017 10:50 AM Cecelia Byars, NT wrote: Has the patient contacted their pharmacy? {yes  (Agent: If no, request that the patient contact the pharmacy for the refill.  [facility] Pharm[facility] (with phone number [facility] street [facility] [address] [facility] [place] Agent: Please be advised that RX refills may take up to 3 business days. We ask that you follow-up with your pharmacy. Patient needs a new prescription  for an epi pin this has expired , she does not have any left

## 2017-04-09 NOTE — Telephone Encounter (Signed)
Epi-pens refilled.

## 2017-04-09 NOTE — Telephone Encounter (Signed)
  Answer Assessment - Initial Assessment Questions 1. TYPE of INSECT: "What type of insect was it?"      Flying bug 2. ONSET: "When did you get bitten?"    This morning 3. LOCATION: "Where is the insect bite located?"     Lt forearm 4. REDNESS: "Is the area red or pink?" If so, ask "What size is area of redness?" (inches or cm). "When did the redness start?"     Small red raised bump but resolved now 5. PAIN: "Is there any pain?" If so, ask: "How bad is it?"  (Scale 1-10; or mild, moderate, severe)     mild 6. ITCHING: "Does it itch?" If so, ask: "How bad is the itch?"    - MILD: doesn't interfere with normal activities   - MODERATE-SEVERE: interferes with work, school, sleep, or other activities      none 7. SWELLING: "How big is the swelling?" (inches, cm, or compare to coins)     Very small 8. OTHER SYMPTOMS: "Do you have any other symptoms?"  (e.g., difficulty breathing, hives)     no 9. PREGNANCY: "Is there any chance you are pregnant?" "When was your last menstrual period?"  na  Protocols used: INSECT BITE-A-AH

## 2017-04-20 ENCOUNTER — Other Ambulatory Visit: Payer: Self-pay | Admitting: Family Medicine

## 2017-04-20 DIAGNOSIS — E038 Other specified hypothyroidism: Secondary | ICD-10-CM

## 2017-04-23 ENCOUNTER — Other Ambulatory Visit: Payer: Self-pay | Admitting: *Deleted

## 2017-04-23 ENCOUNTER — Telehealth: Payer: Self-pay | Admitting: Family Medicine

## 2017-04-23 DIAGNOSIS — R7303 Prediabetes: Secondary | ICD-10-CM

## 2017-04-23 NOTE — Telephone Encounter (Signed)
Orders pended- need current [facility]- not sure which one patient is using. Left message to call back.

## 2017-04-23 NOTE — Telephone Encounter (Signed)
Copied from [place]. Topic: Quick Communication - See Telephone Encounter >> Apr 23, 2017  2:52 PM Corie Chiquito, [place] wrote: CRM for notification. See Telephone encounter for: [facility] because the [facility] needs a [facility]ill on there Metformin and Metoprolol. If someone could give them a call back at [phone].  04/23/17.

## 2017-04-24 NOTE — Telephone Encounter (Signed)
Patient requesting refills on Metformin and Metoprolol, last OV 02/22/17, no refill listed on metoprolol, no mention of starting on metformin.

## 2017-04-24 NOTE — Telephone Encounter (Addendum)
Patient called to verify which [facility] to send her prescription refill, [facility] on [address] in [place], [place].

## 2017-04-25 ENCOUNTER — Other Ambulatory Visit: Payer: Self-pay | Admitting: Emergency Medicine

## 2017-04-25 DIAGNOSIS — R7303 Prediabetes: Secondary | ICD-10-CM

## 2017-04-25 MED ORDER — METOPROLOL SUCCINATE ER 50 MG PO TB24: ORAL_TABLET | ORAL | 1 refills | Status: DC

## 2017-04-25 MED ORDER — METFORMIN HCL 500 MG PO TABS: 500.0000 mg | ORAL_TABLET | Freq: Every day | ORAL | 3 refills | Status: DC

## 2017-04-25 NOTE — Telephone Encounter (Signed)
Refills sent to pharmacy per pt request.

## 2017-04-28 ENCOUNTER — Other Ambulatory Visit: Payer: Self-pay | Admitting: Family Medicine

## 2017-04-28 DIAGNOSIS — I10 Essential (primary) hypertension: Secondary | ICD-10-CM

## 2017-04-30 ENCOUNTER — Telehealth: Payer: Self-pay | Admitting: Family Medicine

## 2017-04-30 NOTE — Telephone Encounter (Signed)
Copied from [place] [phone]. Topic: Inquiry >> Apr 30, 2017  4:17 PM Robyn Montes I, NT wrote: Reason for CRM:pt want doctor [place] Nurse to give her a call.thanks

## 2017-05-01 NOTE — Telephone Encounter (Signed)
Copied from [place] [phone]. Topic: Inquiry >> Apr 30, 2017  4:17 PM Malena Catholic I, NT wrote: Reason for CRM:pt want doctor [place] Nurse to give her a call.thanks

## 2017-05-02 ENCOUNTER — Telehealth: Payer: Self-pay

## 2017-05-02 MED ORDER — FLUOCINOLONE ACETONIDE SCALP 0.01 % EX OIL: TOPICAL_OIL | CUTANEOUS | 3 refills | Status: DC

## 2017-05-02 NOTE — Addendum Note (Signed)
Addended by: Lamar Blinks C on: 05/02/2017 05:00 PM   Modules accepted: Orders

## 2017-05-02 NOTE — Telephone Encounter (Signed)
Follow up call made to patient. States she has problems with excessive dandruff and has used a medication called Candida before that helped. Would like to know if this can be called in a gain.

## 2017-05-02 NOTE — Telephone Encounter (Signed)
Sent message to Dr. Lorelei Pont. Patient requesting Medicated shampoo

## 2017-05-04 ENCOUNTER — Encounter: Payer: Self-pay | Admitting: Medical

## 2017-05-04 ENCOUNTER — Ambulatory Visit (INDEPENDENT_AMBULATORY_CARE_PROVIDER_SITE_OTHER): Payer: Medicare HMO | Admitting: Medical

## 2017-05-04 ENCOUNTER — Ambulatory Visit (HOSPITAL_BASED_OUTPATIENT_CLINIC_OR_DEPARTMENT_OTHER)
Admission: RE | Admit: 2017-05-04 | Discharge: 2017-05-04 | Disposition: A | Discharge status: Home / Self Care | Payer: Medicare HMO | Source: Ambulatory Visit | Attending: Medical | Admitting: Medical

## 2017-05-04 ENCOUNTER — Ambulatory Visit: Payer: Self-pay | Admitting: *Deleted

## 2017-05-04 VITALS — BP 130/82 | HR 76 | Temp 98.3°F | Resp 16 | Ht 66.0 in | Wt 220.0 lb

## 2017-05-04 DIAGNOSIS — E785 Hyperlipidemia, unspecified: Secondary | ICD-10-CM

## 2017-05-04 DIAGNOSIS — M79602 Pain in left arm: Secondary | ICD-10-CM | POA: Diagnosis not present

## 2017-05-04 DIAGNOSIS — R002 Palpitations: Secondary | ICD-10-CM

## 2017-05-04 DIAGNOSIS — R34 Anuria and oliguria: Secondary | ICD-10-CM

## 2017-05-04 DIAGNOSIS — M542 Cervicalgia: Secondary | ICD-10-CM | POA: Diagnosis not present

## 2017-05-04 DIAGNOSIS — R0600 Dyspnea, unspecified: Secondary | ICD-10-CM

## 2017-05-04 DIAGNOSIS — M47812 Spondylosis without myelopathy or radiculopathy, cervical region: Secondary | ICD-10-CM | POA: Diagnosis not present

## 2017-05-04 LAB — POC URINALSYSI DIPSTICK (AUTOMATED)
Bilirubin, UA: NEGATIVE
Blood, UA: NEGATIVE
Clarity, UA: NEGATIVE
Color, UA: NEGATIVE
Glucose, UA: NEGATIVE
Ketones, UA: NEGATIVE
Leukocytes, UA: NEGATIVE
Nitrite, UA: NEGATIVE
Protein, UA: NEGATIVE
Spec Grav, UA: 1.025 (ref 1.010–1.025)
Urobilinogen, UA: 0.2 E.U./dL
pH, UA: 6 (ref 5.0–8.0)

## 2017-05-04 LAB — TROPONIN I: Troponin I: <0.01 ng/mL (ref <=0.0)

## 2017-05-04 NOTE — Patient Instructions (Addendum)
For your history of palpitations in the remote past and recurrence just recently, I am going to refer you to cardiologist.  I try to get you in with a cardiologist in our building.  If no appointment update  by early next week then recommend you call our number and asked to speak with [facility] or get message to [facility] regarding update on cardiologist referral.  Your EKG today appears normal sinus rhythm.  Compared to previous EKGs and no acute changes seen.    Your arm pain seems to be associated with neck pain.  Your description may be radicular/neuropathic pain.  If you have pain that persist for more than 5 minutes you could try ibuprofen over-the-counter.  If you have any arm pain with any associated shortness of breath, chest pain, jaw pain, nausea, vomiting or sweating then recommend ED evaluation.    For your intermittent shortness of breath with activity, I am going to get BNP study today and chest x-ray.  You also have concern for potential heart attack.  I do not think this has occurred.  But did order troponin studies today.  For high cholesterol history we will repeat lipid panel today.  For your reported low urinary output will get CMP.  Note your urine looked clear today.  Follow-up in 7-10 days with PCP or as needed.

## 2017-05-04 NOTE — Telephone Encounter (Signed)
FYI. Appt w/ Percell Miller.

## 2017-05-04 NOTE — Telephone Encounter (Signed)
Pt reports noticed  Some  Fluttering  Sensation in  Chest    Felt like it  Was  Skipping   Beat  And  irreg     It  Is  Better  Now     She  Denies  Any  Chest pain  Or  Shortness  Of  Breath     Pt  Will  Be  Seen today  At  130  Pm   With   [place]  Not to  [place]  Call 911  If  Any  Chest pain or  Shortness of  Breath    Reason for Disposition . [1] Skipped or extra beat(s) AND [2] increases with exercise or exertion  Answer Assessment - Initial Assessment Questions 1. DESCRIPTION: "Please describe your heart rate or heart beat that you are having" (e.g., fast/slow, regular/irregular, skipped or extra beats, "palpitations")    Pt  Reports  Palpations  That  Started  About  30  mins  Ago  Better  Now   2. ONSET: "When did it start?" (Minutes, hours or days)       30  mins 3. DURATION: "How long does it last" (e.g., seconds, minutes, hours)    Just  A  Seconds   4. PATTERN "Does it come and go, or has it been constant since it started?"  "Does it get worse with exertion?"   "Are you feeling it now?"       Comes   And  Goes  Feels  Better  Now    5. TAP: "Using your hand, can you tap out what you are feeling on a chair or table in front of you, so that I can hear?" (Note: not all patients can do this)          BEATING  NORMAL     6. HEART RATE: "Can you tell me your heart rate?" "How many beats in 15 seconds?"  (Note: not all patients can do this)       UNABLE   7. RECURRENT SYMPTOM: "Have you ever had this before?" If so, ask: "When was the last time?" and "What happened that time?"        HAS  HAD  IT  YEARS  AGO   WAS  TOLD  HAD  IRREG  HEARTBEAT  HAD   STRESS  TEST   YEARS    8. CAUSE: "What do you think is causing the palpitations?"       UNKNOWN     FEELS   WEAK  SLIGHTLY   GETS  TIRED  EASILY   PAIN  IN   BOTH   ARMS   X   SEV  WEEKS     AGO    9. CARDIAC HISTORY: "Do you have any history of heart disease?" (e.g., heart attack, angina, bypass surgery,  angioplasty, arrhythmia)         HAS   HAD  ARRHYTHMIA   IN PAST  FAMILY  HISTORY    10. OTHER SYMPTOMS: "Do you have any other symptoms?" (e.g., dizziness, chest pain, sweating, difficulty breathing)      DENIES   ANY  CHEST PAIN   NO   SHORTNESS  OF  BREATH   NO   DIZZYNESS   11. PREGNANCY: "Is there any chance you are pregnant?" "When was your last menstrual period?"       N/A  Protocols used: HEART RATE AND  HEARTBEAT QUESTIONS-A-AH

## 2017-05-04 NOTE — Progress Notes (Signed)
Subjective:    Patient ID: Robyn Montes, female    DOB: [birth year], 62 y.o.   MRN: [id number]  HPI   Pt in states for a few months pt has some excruciating pain in her left arm area. Pain last for 5 minutes. Over last month pain occurs about 2 times week. Pain start in neck and will shoot toward scapula at times and down her arm at time. Overall pain for 2 months. 1st month would happened just one time a month. Her left hand does not tingle with pain.  Last time had the above type pain was 2 days ago.   Pt states that she feels palpitation sensation. She states/describes like heart skips a beat. Pt states this started about 2 weeks. She states over past 2 weeks would occur about 3 times a week randomly. Would last at best 2 minutes then resolve. This morning her palpitiiton sensation was sustained for one hour. But stopped and has not returned since then.  Pt had holter monitor about 7 years ago and study was positive. Maybe pvc. Pt told some people have this and described as benign. I don't have those records.  2015 echo done and showed EF of 55-60%.   Pt feels some easier fatigue over past few weeks with minimal activity. Some sob going up steps and working out. No sob presently.  No pain in chest with the above.    Pt also at end of interview describes weaker urinary flow. No uti type signs and symptoms  Hx of hyperlipidemia. Pt is diabetic.    Review of Systems  Constitutional: Negative for chills, fatigue and fever.  HENT: Negative for congestion, drooling, ear pain, hearing loss, mouth sores, postnasal drip, sinus pressure, sinus pain and sneezing.   Respiratory: Negative for cough, choking, shortness of breath and wheezing.        No sob now but some intermittent.  Cardiovascular: Negative for chest pain and palpitations.  Gastrointestinal: Negative for abdominal pain, diarrhea, nausea and vomiting.  Musculoskeletal: Positive for neck pain.       Neck pain  Arm pain.     None of the above presenty.  Skin: Negative for rash.  Neurological: Negative for dizziness, weakness, light-headedness, numbness and headaches.  Hematological: Negative for adenopathy. Does not bruise/bleed easily.  Psychiatric/Behavioral: Negative for behavioral problems and confusion.   Past Medical History:  Diagnosis Date  . Anemia 1976, 1984, 2001, 2003   history of multiple transfusions (3); iron deficiency  . Arthritis    hands, knees B; DDD lumbar  . Blood transfusion without reported diagnosis   . Family history of heart disease   . Heart murmur   . Hepatitis C 04/17/2013   acquired from blood transfusion; s/p [facility] in [place].  Marland Kitchen HTN (hypertension)   . Hypothyroid   . Memory loss 08/21/2014  . Palpitations   . Shortness of breath      Social History   Socioeconomic History  . Marital status: Divorced    Spouse name: Not on file  . Number of children: Not on file  . Years of education: Not on file  . Highest education level: Not on file  Social Needs  . Financial resource strain: Not on file  . Food insecurity - worry: Not on file  . Food insecurity - inability: Not on file  . Transportation needs - medical: Not on file  . Transportation needs - non-medical: Not on file  Occupational History  .  Not on file  Tobacco Use  . Smoking status: Never Smoker  . Smokeless tobacco: Never Used  Substance and Sexual Activity  . Alcohol use: No  . Drug use: No  . Sexual activity: Not on file  Other Topics Concern  . Not on file  Social History Narrative   Marital status: divorced since 2012 after 52 years of marriage; not dating.  Still with ex-husband.      Children:  3 children; 2 grandchildren.      Lives: with daughter [name])      Employment:  Disability for "all little bit of everything"; chronic anemia, DDD lumbar, memory loss, thyroid.  Previous Research scientist (life sciences).      Tobacco: none      Alcohol: none      Drugs: none      Exercise:  walking twice daily for 30 minutes each time.      Sexual activity: same partner/husband.        ADLs: independent with ADLs; drives.          Past Surgical History:  Procedure Laterality Date  . LIVER BIOPSY      Family History  Problem Relation Age of Onset  . Heart disease Mother        heart attack and stroke; passed away in her 10s  . Diabetes Mother   . Hyperlipidemia Mother   . Hypertension Mother   . Stroke Mother   . Heart disease Father        passed away in his 31s  . Hyperlipidemia Father   . Diabetes Brother   . Heart disease Brother   . Hypertension Brother   . Diabetes Brother   . Hypertension Sister     Allergies  Allergen Reactions  . Bee Venom     Current Outpatient Medications on File Prior to Visit  Medication Sig Dispense Refill  . Cholecalciferol 2000 UNITS TABS Take by mouth.    . clindamycin-benzoyl peroxide (BENZACLIN) gel Apply topically 2 (two) times daily. 25 g 6  . donepezil (ARICEPT) 10 MG tablet Take 1 tablet (10 mg total) by mouth at bedtime. May increase to 2 pills after 4 weeks 30 tablet 5  . EPINEPHrine 0.3 mg/0.3 mL IJ SOAJ injection Inject 0.3 mLs (0.3 mg total) into the skin once as needed for up to 1 dose. 2 Device 3  . Fluocinolone Acetonide Scalp 0.01 % OIL Apply to scalp and leave on 4 hours or overnight, repeat twice a week as needed 118 mL 3  . furosemide (LASIX) 20 MG tablet Take 1 tablet (20 mg total) by mouth daily. 30 tablet 1  . losartan-hydrochlorothiazide (HYZAAR) 50-12.5 MG tablet TAKE 1 TABLET BY MOUTH 2 (TWO) TIMES DAILY. 180 tablet 3  . lovastatin (MEVACOR) 20 MG tablet Take 1 tablet (20 mg total) at bedtime by mouth. 90 tablet 3  . metFORMIN (GLUCOPHAGE) 500 MG tablet Take 1 tablet (500 mg total) by mouth daily with breakfast. 90 tablet 3  . metoprolol succinate (TOPROL-XL) 50 MG 24 hr tablet TAKE 1 TABLET BY MOUTH DAILY. TAKE WITH OR IMMEDIATELY FOLLOWING A MEAL. 90 tablet 1  . potassium chloride (K-DUR) 10 MEQ  tablet Take 1 tablet (10 mEq total) by mouth daily. 30 tablet 1  . sertraline (ZOLOFT) 50 MG tablet Take 1 tablet (50 mg total) by mouth daily. 90 tablet 3  . SYNTHROID 100 MCG tablet TAKE 1 TABLE BY MOUTH DAILY. 90 tablet 2  . Tetrahydroz-Glyc-Hyprom-PEG (VISINE MAXIMUM REDNESS RELIEF)  0.05-0.2-0.36-1 % SOLN Apply 2-3 drops to eye daily as needed (for eye redness).    . Vitamin D, Ergocalciferol, (DRISDOL) 50000 units CAPS capsule TAKE ONE CAPSULE BY MOUTH ONCE A WEEK 12 capsule 0   No current facility-administered medications on file prior to visit.     BP 130/82 (BP Location: Right Arm, Patient Position: Sitting, Cuff Size: Large)   Pulse 76   Temp 98.3 F (36.8 C) (Oral)   Resp 16   Ht 5\' 6"  (1.676 m)   Wt 220 lb (99.8 kg)   SpO2 98%   BMI 35.51 kg/m       Objective:   Physical Exam  General Mental Status- Alert. General Appearance- Not in acute distress.   Skin General: Color- Normal Color. Moisture- Normal Moisture.  Neck Carotid Arteries- Normal color. Moisture- Normal Moisture. No carotid bruits. No JVD. Pain palpaiton left trap. No med c spine pain.  Left arm- no pain on palpation present on palpation. Good rom Left shoulder- pain on palpation.  Chest and Lung Exam Auscultation: Breath Sounds:-Normal.  Cardiovascular Auscultation:Rythm- Regular. Murmurs & Other Heart Sounds:Auscultation of the heart reveals- No Murmurs.  Abdomen Inspection:-Inspeection Normal. Palpation/Percussion:Note:No mass. Palpation and Percussion of the abdomen reveal- Non Tender, Non Distended + BS, no rebound or guarding.   Neurologic Cranial Nerve exam:- CN III-XII intact(No nystagmus), symmetric smile. Strength:- 5/5 equal and symmetric strength both upper and lower extremities.  Lower ext- no pedal edema. Negative homans signs.symmetric calfs.        Assessment & Plan:  For your history of palpitations in the remote past and recurrence just recently, I am going to refer  you to cardiologist.  I try to get you in with a cardiologist in our building.  If no oncology by early next week then recommend you call our number and asked to speak with [facility] or get message to [facility] regarding update on cardiologist referral.  Your EKG today appears normal sinus rhythm.  Compared to previous EKGs and no acute changes seen.    Your arm pain seems to be associated with neck pain.  Your description may be radicular/neuropathic pain.  If you have pain that persist for more than 5 minutes you could try ibuprofen over-the-counter.  If you have any arm pain with any associated shortness of breath, chest pain, jaw pain, nausea, vomiting or sweating then recommend ED evaluation.    For your intermittent shortness of breath with activity, I am going to get BNP study today and chest x-ray.  You also have concern for potential heart attack.  I do not think this has occurred.  But did order troponin studies today.  For high cholesterol history we will repeat lipid panel today.  For your reported low urinary output will get CMP.  Note your urine looked clear today.  Follow-up in 7-10 days with PCP or as needed.  40 minutes spent with pt. 50% of time discsussing differential diagnosis of her various symptoms. Educating her on work up and plan going forward depending on result of studies.  [place], Percell Miller, PA-C

## 2017-05-05 LAB — COMPREHENSIVE METABOLIC PANEL
AG Ratio: 1.4 (calc) (ref 1.0–2.5)
ALT: 11 U/L (ref 6–29)
AST: 15 U/L (ref 10–35)
Albumin: 4.3 g/dL (ref 3.6–5.1)
Alkaline phosphatase (APISO): 68 U/L (ref 33–130)
BUN: 10 mg/dL (ref 7–25)
CO2: 29 mmol/L (ref 20–32)
Calcium: 9.4 mg/dL (ref 8.6–10.4)
Chloride: 104 mmol/L (ref 98–110)
Creat: 0.77 mg/dL (ref 0.50–0.99)
Globulin: 3 g/dL (calc) (ref 1.9–3.7)
Glucose, Bld: 89 mg/dL (ref 65–99)
Potassium: 4 mmol/L (ref 3.5–5.3)
Sodium: 140 mmol/L (ref 135–146)
Total Bilirubin: 0.3 mg/dL (ref 0.2–1.2)
Total Protein: 7.3 g/dL (ref 6.1–8.1)

## 2017-05-05 LAB — BRAIN NATRIURETIC PEPTIDE: Brain Natriuretic Peptide: 12 pg/mL (ref <100)

## 2017-05-08 ENCOUNTER — Telehealth: Payer: Self-pay | Admitting: *Deleted

## 2017-05-08 NOTE — Telephone Encounter (Signed)
Received results from [facility]; forwarded to provider/SLS 01/22

## 2017-05-18 ENCOUNTER — Ambulatory Visit: Payer: Medicare HMO | Admitting: Cardiology

## 2017-06-07 ENCOUNTER — Encounter: Payer: Self-pay | Admitting: Cardiology

## 2017-07-25 ENCOUNTER — Encounter: Payer: Self-pay | Admitting: Physician Assistant

## 2017-08-10 ENCOUNTER — Telehealth: Payer: Self-pay | Admitting: Family Medicine

## 2017-08-10 NOTE — Telephone Encounter (Signed)
Called pharmacy but they had already taken care of splitting her med into two, all set

## 2017-08-10 NOTE — Telephone Encounter (Signed)
-----   Message from Emi Holes, [place] sent at 08/09/2017  5:49 PM EDT ----- Regarding: Pharmacy Received fax from pharmacy. Pt requesting new rx : Need the losartan and HCTZ to be 2 different scripts due to the fact that the combo is on backorder.

## 2017-09-10 ENCOUNTER — Encounter: Payer: Self-pay | Admitting: Family Medicine

## 2017-10-15 ENCOUNTER — Other Ambulatory Visit: Payer: Self-pay | Admitting: Family Medicine

## 2017-10-15 DIAGNOSIS — E038 Other specified hypothyroidism: Secondary | ICD-10-CM

## 2017-12-21 ENCOUNTER — Ambulatory Visit: Payer: Self-pay | Admitting: Surgery

## 2017-12-21 NOTE — H&P (Signed)
History of Present Illness Robyn Montes. Tsuei MD; 12/21/2017 12:13 PM) The patient is a 62 year old female who presents with a complaint of Mass. Referred by Dr. Janett Billow Copland for enlarging mass on back  This is a 62 year old female who presents with several years of an enlarging mass on the upper right back. This has become very prominent and is causing some discomfort. It has never become infected. She brought this to the attention of her primary care physician who felt that this represented a subcutaneous lipoma. She is referred to [place] for surgical evaluation. There has not been any imaging of this area.   Past Surgical History Dalbert Mayotte, [place]; 12/21/2017 10:15 AM) No pertinent past surgical history  Diagnostic Studies History Dalbert Mayotte, [place]; 12/21/2017 10:15 AM) Colonoscopy 1-5 years ago Mammogram 1-3 years ago Pap Smear 1-5 years ago  Medication History Dalbert Mayotte, CMA; 12/21/2017 10:17 AM) hydroCHLOROthiazide (12.5MG  Tablet, Oral) Active. Losartan Potassium (50MG  Tablet, Oral) Active. metFORMIN HCl (500MG  Tablet, Oral) Active. Metoprolol Succinate ER (50MG  Tablet ER 24HR, Oral) Active. Synthroid (100MCG Tablet, Oral) Active. Medications Reconciled  Social History Dalbert Mayotte, [place]; 12/21/2017 10:15 AM) No alcohol use No caffeine use No drug use Tobacco use Never smoker.  Family History Dalbert Mayotte, [place]; 12/21/2017 10:15 AM) Depression Daughter. Diabetes Mellitus Brother, Mother, Sister. Heart Disease Father. Heart disease in female family member before age 45 Hypertension Brother, Mother, Sister. Respiratory Condition Father. Thyroid problems Brother, Mother.  Pregnancy / Birth History Dalbert Mayotte, [place]; 12/21/2017 10:15 AM) Age at menarche 33 years. Age of menopause 79-55 Gravida 5 Maternal age 45-20 Para 3  Other Problems Dalbert Mayotte, [place]; 12/21/2017 10:15 AM) Anxiety Disorder Back Pain Cholelithiasis Diabetes  [place] High blood pressure Sleep Apnea Thyroid Cancer Thyroid Disease Transfusion history     Review of Systems Dalbert Mayotte CMA; 12/21/2017 10:16 AM) General Present- Chills, Fatigue, Night Sweats and Weight Gain. Not Present- Appetite Loss, Fever and Weight Loss. Skin Present- Change in Wart/Mole and Non-Healing Wounds. Not Present- Dryness, Hives, Jaundice, New Lesions, Rash and Ulcer. HEENT Present- Visual Disturbances and Wears glasses/contact lenses. Not Present- Earache, Hearing Loss, Hoarseness, Nose Bleed, Oral Ulcers, Ringing in the Ears, Seasonal Allergies, Sinus Pain, Sore Throat and Yellow Eyes. Breast Present- Nipple Discharge. Not Present- Breast Mass, Breast Pain and Skin Changes. Cardiovascular Present- Leg Cramps, Palpitations and Swelling of Extremities. Not Present- Chest Pain, Difficulty Breathing Lying Down, Rapid Heart Rate and Shortness of Breath. Gastrointestinal Present- Bloating, Bloody Stool and Chronic diarrhea. Not Present- Abdominal Pain, Change in Bowel Habits, Constipation, Difficulty Swallowing, Excessive gas, Gets full quickly at meals, Hemorrhoids, Indigestion, Nausea, Rectal Pain and Vomiting. Female Genitourinary Present- Frequency, Nocturia and Urgency. Not Present- Painful Urination and Pelvic Pain. Musculoskeletal Present- Back Pain, Joint Pain, Joint Stiffness, Muscle Pain, Muscle Weakness and Swelling of Extremities. Neurological Present- Decreased Memory, Numbness and Tremor. Not Present- Fainting, Headaches, Seizures, Tingling, Trouble walking and Weakness. Psychiatric Present- Anxiety and Fearful. Not Present- Bipolar, Change in Sleep Pattern, Depression and Frequent crying. Hematology Present- Easy Bruising and Gland problems. Not Present- Blood Thinners, Excessive bleeding, HIV and Persistent Infections.  Vitals Dalbert Mayotte CMA; 12/21/2017 10:18 AM) 12/21/2017 10:17 AM Weight: 214.4 lb Height: 66in Body Surface Area: 2.06 m  Body Mass Index: 34.6 kg/m  Temp.: 42F  Pulse: 77 (Regular)  BP: 110/82 (Sitting, Left Arm, Standard)      Physical Exam Rodman Key K. Tsuei MD; 12/21/2017 12:14 PM)  The physical exam findings are as follows: Note:WDWN in NAD Eyes:  Pupils equal, round; sclera anicteric HENT: Oral mucosa moist; good dentition Neck: No masses palpated, no thyromegaly Lungs: CTA bilaterally; normal respiratory effort CV: Regular rate and rhythm; no murmurs; extremities well-perfused with no edema Abd: +bowel sounds, soft, non-tender, no palpable organomegaly; no palpable hernias Back: The upper right back shows a very large protruding 12 cm diameter subcutaneous mass. This is fairly well demarcated. The surface seems to be smooth to palpation. No overlying skin changes. Skin: Warm, dry; no sign of jaundice Psychiatric - alert and oriented x 4; calm mood and affect    Assessment & Plan Rodman Key K. Tsuei MD; 12/21/2017 12:14 PM)  LIPOMA OF BACK (D17.1) Impression: Right upper 12 cm diameter  Current Plans Schedule for Surgery - excision of subcutaneous lipoma upper back. The surgical procedure has been discussed with the patient. Potential risks, benefits, alternative treatments, and expected outcomes have been explained. All of the patient's questions at this time have been answered. The likelihood of reaching the patient's treatment goal is good. The patient understand the proposed surgical procedure and wishes to proceed. She understands that she will likely have a drain in place for several days after surgery.  The patient has never had general anesthesia. She has some concerns because several family members have had significant issues with anesthesia. We will try to set her up for a consultation with anesthesia prior to the date of her surgery to address her concerns.  Robyn Montes. Georgette Dover, MD, [facility] Surgery  General/ Trauma Surgery Beeper [phone]  12/21/2017 12:15  PM

## 2018-01-14 ENCOUNTER — Telehealth: Payer: Self-pay | Admitting: *Deleted

## 2018-01-14 NOTE — Telephone Encounter (Signed)
Received Physician Orders from [place] stating Further Information Required, request [employer] Rx/CMN be signed & dated & [place] [facility] Request for Prior Approval CMA/PA complete #[id number] and also sign & dated; forwarded to provider/SLS 09/30

## 2018-01-15 ENCOUNTER — Other Ambulatory Visit: Payer: Self-pay | Admitting: Family Medicine

## 2018-01-21 ENCOUNTER — Other Ambulatory Visit: Payer: Self-pay | Admitting: Family Medicine

## 2018-01-21 DIAGNOSIS — Z1231 Encounter for screening mammogram for malignant neoplasm of breast: Secondary | ICD-10-CM

## 2018-01-28 ENCOUNTER — Telehealth: Payer: Self-pay | Admitting: Family Medicine

## 2018-01-28 NOTE — Telephone Encounter (Signed)
Please advise on dosage amount patient should have?

## 2018-01-28 NOTE — Telephone Encounter (Signed)
Please give her a call- we have not checked her vitamin D level in some time. I would advise her to take 2,000 IU per day which she can get OTC, no rx needed.  Then come and see me in about a month and we can check her level for her

## 2018-01-28 NOTE — Telephone Encounter (Signed)
Copied from [place] [id number]. Topic: Quick Communication - Rx Refill/Question >> Jan 28, 2018  2:27 PM Scherrie Gerlach wrote: Medication: Vitamin D, Ergocalciferol, (DRISDOL) 50000 units CAPS capsule Pt states she does not think she needs this high of a dose because it was elevated a little every time she had the vit lab. Pt request a 5000 and take one time a day Pt states she let the Rx run out and now needs a refill of this  [id number] [address], [place] [phone] (Phone) [phone] (Fax)

## 2018-01-29 NOTE — Telephone Encounter (Signed)
Yes, I did get letter form the surgeon's office

## 2018-01-29 NOTE — Telephone Encounter (Signed)
Patient requests to call back and schedule. Patient verbalized understanding.

## 2018-01-29 NOTE — Telephone Encounter (Signed)
Patient would like to know if you received report for [place] surgery regarding her back. She has scheduled her surgery for 02/20/18

## 2018-02-04 ENCOUNTER — Ambulatory Visit: Payer: Self-pay | Admitting: Surgery

## 2018-02-04 NOTE — H&P (Signed)
History of Present Illness Robyn Montes. Tsuei MD; 02/04/2018 11:01 AM) The patient is a 62 year old female who presents with a complaint of Mass. Referred by Dr. Janett Billow Copland for enlarging mass on back  This is a 62 year old female who presents with several years of an enlarging mass on the upper right back. This has become very prominent and is causing some discomfort. It has never become infected. She brought this to the attention of her primary care physician who felt that this represented a subcutaneous lipoma. She is referred to [place] for surgical evaluation. There has not been any imaging of this area.  She is scheduled for surgery on 02/20/18 for excision of this area. She comes in today because she feels another smaller mass in the lower back.    Problem List/Past Medical Rodman Key K. Tsuei, MD; 02/04/2018 11:01 AM) LIPOMA OF BACK (D17.1)  Past Surgical History Robyn Montes. Tsuei, MD; 02/04/2018 11:01 AM) No pertinent past surgical history  Diagnostic Studies History (Matthew K. Tsuei, MD; 02/04/2018 11:01 AM) Colonoscopy 1-5 years ago Mammogram 1-3 years ago Pap Smear 1-5 years ago  Allergies [employer], RMA; 02/04/2018 10:57 AM) No Known Drug Allergies [02/04/2018]: Allergies Reconciled  Medication History [employer], RMA; 02/04/2018 10:57 AM) hydroCHLOROthiazide (12.5MG  Tablet, Oral) Active. Losartan Potassium (50MG  Tablet, Oral) Active. metFORMIN HCl (500MG  Tablet, Oral) Active. Metoprolol Succinate ER (50MG  Tablet ER 24HR, Oral) Active. Synthroid (100MCG Tablet, Oral) Active. Amoxicillin (500MG  Capsule, Oral) Active. Medications Reconciled  Social History Robyn Montes. Tsuei, MD; 02/04/2018 11:01 AM) No alcohol use No caffeine use No drug use Tobacco use Never smoker.  Family History Robyn Montes. Tsuei, MD; 02/04/2018 11:01 AM) Depression Daughter. Diabetes Mellitus Brother, Mother, Sister. Heart Disease Father. Heart  disease in female family member before age 74 Hypertension Brother, Mother, Sister. Respiratory Condition Father. Thyroid problems Brother, Mother.  Pregnancy / Birth History Robyn Montes. Tsuei, MD; 02/04/2018 11:01 AM) Age at menarche 69 years. Age of menopause 65-55 Gravida 5 Maternal age 38-20 Para 3  Other Problems Robyn Montes. Tsuei, MD; 02/04/2018 11:01 AM) Anxiety Disorder Back Pain Cholelithiasis Diabetes Mellitus High blood pressure Sleep Apnea Thyroid Cancer Thyroid Disease Transfusion history    Vitals (Jacqueline Haggett RMA; 02/04/2018 10:58 AM) 02/04/2018 10:57 AM Weight: 211.2 lb Height: 66in Body Surface Area: 2.05 m Body Mass Index: 34.09 kg/m  Temp.: 96.47F(Temporal)  Pulse: 91 (Regular)  P.OX: 97% (Room air)      Physical Exam Rodman Key K. Tsuei MD; 02/04/2018 11:02 AM)  The physical exam findings are as follows: Note:WDWN in NAD Eyes: Pupils equal, round; sclera anicteric HENT: Oral mucosa moist; good dentition Neck: No masses palpated, no thyromegaly Lungs: CTA bilaterally; normal respiratory effort CV: Regular rate and rhythm; no murmurs; extremities well-perfused with no edema Abd: +bowel sounds, soft, non-tender, no palpable organomegaly; no palpable hernias Back: The upper right back shows a very large protruding 12 cm diameter subcutaneous mass. This is fairly well demarcated. The surface seems to be smooth to palpation. No overlying skin changes. Lower right back adjacent to spine - firm, smooth 2 cm subcutaneous mass. Skin: Warm, dry; no sign of jaundice Psychiatric - alert and oriented x 4; calm mood and affect    Assessment & Plan Rodman Key K. Tsuei MD; 02/04/2018 10:59 AM)  LIPOMA OF BACK (D17.1) Impression: Upper back 12 cm Lower back 2 cm  Current Plans Schedule for Surgery - Excision of two subcutaneous lipomas - back. The surgical procedure has been discussed with the patient.  Potential  risks,  benefits, alternative treatments, and expected outcomes have been explained.  All of the patient's questions at this time have been answered.  The likelihood of reaching the patient's treatment goal is good.  The patient understand the proposed surgical procedure and wishes to proceed.  Robyn Montes. Georgette Dover, MD, [facility] Surgery  General/ Trauma Surgery Beeper [phone]  02/04/2018 11:03 AM

## 2018-02-07 ENCOUNTER — Telehealth: Payer: Self-pay | Admitting: Family Medicine

## 2018-02-07 NOTE — Telephone Encounter (Signed)
Copied from [place] [id number]. Topic: Quick Communication - See Telephone Encounter >> Feb 07, 2018  9:42 AM Ahmed Prima L wrote: CRM for notification. See Telephone encounter for: 02/07/18.  hydroquinone 4 % cream & Tretinoin cream 0.025 %, patient is requesting this for the dark patches on her elbow & face. She said this worked last time and now the script has run out and needs a new one. Patient declined appointment.  Preferred Pharmacy    [id number] [place], [address] [place] [address]

## 2018-02-08 ENCOUNTER — Other Ambulatory Visit: Payer: Self-pay | Admitting: Family Medicine

## 2018-02-08 DIAGNOSIS — I10 Essential (primary) hypertension: Secondary | ICD-10-CM

## 2018-02-08 MED ORDER — TRETINOIN 0.025 % EX CREA: TOPICAL_CREAM | Freq: Every day | CUTANEOUS | 0 refills | Status: DC

## 2018-02-08 MED ORDER — HYDROQUINONE 4 % EX CREA: 1.0000 "application " | TOPICAL_CREAM | Freq: Every day | CUTANEOUS | 0 refills | Status: DC

## 2018-02-08 NOTE — Telephone Encounter (Signed)
Copied from [place] [id number]. Topic: Quick Communication - Rx Refill/Question >> Feb 08, 2018  5:56 PM Yvette Rack wrote: Medication: losartan-hydrochlorothiazide (HYZAAR) 50-12.5 MG tablet  Has the patient contacted their pharmacy? yes   Preferred Pharmacy (with phone number or street name): [id number] [place], [place] [phone] (Phone) [phone] (Fax)  Agent: Please be advised that RX refills may take up to 3 business days. We ask that you follow-up with your pharmacy.

## 2018-02-11 ENCOUNTER — Emergency Department
Admission: EM | Admit: 2018-02-11 | Discharge: 2018-02-11 | Discharge status: Against Medical Advice | Payer: Medicare HMO | Attending: Emergency Medicine | Admitting: Emergency Medicine

## 2018-02-11 ENCOUNTER — Other Ambulatory Visit: Payer: Self-pay

## 2018-02-11 DIAGNOSIS — R1084 Generalized abdominal pain: Secondary | ICD-10-CM | POA: Insufficient documentation

## 2018-02-11 DIAGNOSIS — I10 Essential (primary) hypertension: Secondary | ICD-10-CM | POA: Diagnosis not present

## 2018-02-11 DIAGNOSIS — E039 Hypothyroidism, unspecified: Secondary | ICD-10-CM | POA: Diagnosis not present

## 2018-02-11 DIAGNOSIS — Z79899 Other long term (current) drug therapy: Secondary | ICD-10-CM | POA: Insufficient documentation

## 2018-02-11 DIAGNOSIS — R197 Diarrhea, unspecified: Secondary | ICD-10-CM | POA: Diagnosis not present

## 2018-02-11 LAB — COMPREHENSIVE METABOLIC PANEL
ALT: 14 U/L (ref 0–44)
AST: 20 U/L (ref 15–41)
Albumin: 3.8 g/dL (ref 3.5–5.0)
Alkaline Phosphatase: 54 U/L (ref 38–126)
Anion gap: 8 (ref 5–15)
BUN: 13 mg/dL (ref 6–20)
CO2: 26 mmol/L (ref 22–32)
Calcium: 9.4 mg/dL (ref 8.9–10.3)
Chloride: 105 mmol/L (ref 98–111)
Creatinine, Ser: 0.75 mg/dL (ref 0.44–1.00)
GFR calc Af Amer: >60 mL/min (ref >60)
GFR calc non Af Amer: >60 mL/min (ref >60)
Glucose, Bld: 94 mg/dL (ref 70–99)
Potassium: 3.7 mmol/L (ref 3.5–5.1)
Sodium: 139 mmol/L (ref 135–145)
Total Bilirubin: 0.4 mg/dL (ref 0.3–1.2)
Total Protein: 7.4 g/dL (ref 6.5–8.1)

## 2018-02-11 LAB — URINALYSIS, COMPLETE (UACMP) WITH MICROSCOPIC
Bacteria, UA: NONE SEEN
Bilirubin Urine: NEGATIVE
Glucose, UA: NEGATIVE mg/dL
Hgb urine dipstick: NEGATIVE
Ketones, ur: NEGATIVE mg/dL
Leukocytes, UA: NEGATIVE
Nitrite: NEGATIVE
Protein, ur: NEGATIVE mg/dL
Specific Gravity, Urine: 1.024 (ref 1.005–1.030)
pH: 5 (ref 5.0–8.0)

## 2018-02-11 LAB — CBC
HCT: 39.5 % (ref 36.0–46.0)
Hemoglobin: 12.7 g/dL (ref 12.0–15.0)
MCH: 27.1 pg (ref 26.0–34.0)
MCHC: 32.2 g/dL (ref 30.0–36.0)
MCV: 84.2 fL (ref 80.0–100.0)
Platelets: 240 10*3/uL (ref 150–400)
RBC: 4.69 MIL/uL (ref 3.87–5.11)
RDW: 14.6 % (ref 11.5–15.5)
WBC: 10.9 10*3/uL — ABNORMAL HIGH (ref 4.0–10.5)
nRBC: 0 % (ref 0.0–0.2)

## 2018-02-11 LAB — LIPASE, BLOOD: Lipase: 39 U/L (ref 11–51)

## 2018-02-11 MED ORDER — LOSARTAN POTASSIUM-HCTZ 50-12.5 MG PO TABS: 1.0000 | ORAL_TABLET | Freq: Two times a day (BID) | ORAL | 0 refills | Status: DC

## 2018-02-11 NOTE — Discharge Instructions (Signed)
Please return immediately for any symptom that worsens.

## 2018-02-11 NOTE — Telephone Encounter (Signed)
Courtesy refill. Needs to schedule an appointment.

## 2018-02-11 NOTE — ED Triage Notes (Signed)
Patient reports right sided abdominal pain with nausea, vomiting and diarrhea.  Also reports headache.  Patient reports she ate foot with Samonella.

## 2018-02-11 NOTE — ED Provider Notes (Signed)
[facility] Emergency Department Provider Note  ____________________________________________   None    (approximate)  I have reviewed the triage vital signs and the nursing notes.   HISTORY  Chief Complaint Abdominal Pain   HPI Robyn Montes is a 62 y.o. female who presents to the emergency department for treatment and evaluation of right side abdominal pain with nausea, vomiting, and diarrhea. She also has a headache.  She states that Wednesday, she was eating with her son and soon after developed diarrhea and vomiting.  She states that the abdominal cramping and bloating and headache started on Friday.  Her son informed her that something she ate contained Salmonella.  She states that today, the abdominal cramping has gotten worse and she had a fever of 103 earlier that she treated with Tylenol.  She is now able to hold fluids without vomiting. Diarrhea has also slowed down.  Past Medical History:  Diagnosis Date  . Anemia 1976, 1984, 2001, 2003   history of multiple transfusions (3); iron deficiency  . Arthritis    hands, knees B; DDD lumbar  . Blood transfusion without reported diagnosis   . Family history of heart disease   . Heart murmur   . Hepatitis C 04/17/2013   acquired from blood transfusion; s/p [facility] in [place].  Marland Kitchen HTN (hypertension)   . Hypothyroid   . Memory loss 08/21/2014  . Palpitations   . Shortness of breath     Patient Active Problem List   Diagnosis Date Noted  . Hepatitis B immune 04/04/2016  . Hypothyroidism 10/01/2014  . Memory loss 08/21/2014  . Chronic hepatitis C ([place]) 08/04/2014  . History of other specified conditions presenting hazards to health 08/04/2014  . Palpitations 10/03/2013  . Essential hypertension 10/03/2013  . Biliary calculi 01/17/2013    Past Surgical History:  Procedure Laterality Date  . LIVER BIOPSY      Prior to Admission medications   Medication Sig Start Date End  Date Taking? Authorizing Provider  amoxicillin (AMOXIL) 500 MG capsule Take 500 mg by mouth 3 (three) times daily. 01/30/18   [provider]  Brimonidine Tartrate (LUMIFY) 0.025 % SOLN Place 1-2 drops into both eyes daily as needed (for dry eyes).    [provider]  clindamycin-benzoyl peroxide (BENZACLIN) gel Apply topically 2 (two) times daily. Patient not taking: Reported on 02/06/2018 01/18/17   Tereasa Coop, PA-C  donepezil (ARICEPT) 10 MG tablet Take 1 tablet (10 mg total) by mouth at bedtime. May increase to 2 pills after 4 weeks Patient not taking: Reported on 02/05/2018 10/27/15   Copland, Gay Filler, MD  EPINEPHrine 0.3 mg/0.3 mL IJ SOAJ injection Inject 0.3 mLs (0.3 mg total) into the skin once as needed for up to 1 dose. Patient taking differently: Inject 0.3 mg into the skin once.  04/09/17   Copland, Gay Filler, MD  Fluocinolone Acetonide Scalp 0.01 % OIL Apply to scalp and leave on 4 hours or overnight, repeat twice a week as needed Patient not taking: Reported on 02/06/2018 05/02/17   Copland, Gay Filler, MD  furosemide (LASIX) 20 MG tablet Take 1 tablet (20 mg total) by mouth daily. Patient not taking: Reported on 02/05/2018 01/18/17   Tereasa Coop, PA-C  hydroquinone 4 % cream Apply 1 application topically daily. Use for 2-3 weeks only 02/08/18   Copland, Gay Filler, MD  losartan-hydrochlorothiazide (HYZAAR) 50-12.5 MG tablet Take 1 tablet by mouth 2 (two) times daily. 02/11/18  Copland, Gay Filler, MD  lovastatin (MEVACOR) 20 MG tablet Take 1 tablet (20 mg total) at bedtime by mouth. Patient not taking: Reported on 02/05/2018 02/27/17   Copland, Gay Filler, MD  metFORMIN (GLUCOPHAGE) 500 MG tablet Take 1 tablet (500 mg total) by mouth daily with breakfast. 04/25/17   Copland, Gay Filler, MD  metoprolol succinate (TOPROL-XL) 50 MG 24 hr tablet TAKE 1 TABLET BY MOUTH DAILY. TAKE WITH OR IMMEDIATELY FOLLOWING A MEAL. Patient not taking: Reported on 02/05/2018  01/17/18   Copland, Gay Filler, MD  Naphazoline HCl (CLEAR EYES OP) Place 2 drops into both eyes daily as needed (for dry eyes).    [provider]  potassium chloride (K-DUR) 10 MEQ tablet Take 1 tablet (10 mEq total) by mouth daily. Patient not taking: Reported on 02/05/2018 01/18/17   Tereasa Coop, PA-C  sertraline (ZOLOFT) 50 MG tablet Take 1 tablet (50 mg total) by mouth daily. Patient not taking: Reported on 02/05/2018 03/29/16   Copland, Gay Filler, MD  SYNTHROID 100 MCG tablet TAKE 1 TABLE BY MOUTH DAILY. Patient taking differently: Take 100 mcg by mouth daily before breakfast.  10/15/17   Copland, Gay Filler, MD  tretinoin (RETIN-A) 0.025 % cream Apply topically at bedtime. 02/08/18   Copland, Gay Filler, MD  Vitamin D, Ergocalciferol, (DRISDOL) 50000 units CAPS capsule TAKE ONE CAPSULE BY MOUTH ONCE A WEEK Patient not taking: Reported on 02/05/2018 05/22/16   Copland, Gay Filler, MD    Allergies Bee venom  Family History  Problem Relation Age of Onset  . Heart disease Mother        heart attack and stroke; passed away in her 56s  . Diabetes Mother   . Hyperlipidemia Mother   . Hypertension Mother   . Stroke Mother   . Heart disease Father        passed away in his 5s  . Hyperlipidemia Father   . Diabetes Brother   . Heart disease Brother   . Hypertension Brother   . Diabetes Brother   . Hypertension Sister     Social History Social History   Tobacco Use  . Smoking status: Never Smoker  . Smokeless tobacco: Never Used  Substance Use Topics  . Alcohol use: No  . Drug use: No    Review of Systems  Constitutional: Positive for fever/chills Eyes: No visual changes. ENT: No sore throat. Cardiovascular: Denies chest pain. Respiratory: Denies shortness of breath. Gastrointestinal: Positive for abdominal pain.  Positive for nausea and vomiting. Positive for diarrhea.  No constipation. Genitourinary: Negative for dysuria. Musculoskeletal: Negative for back  pain. Skin: Negative for rash. Neurological: Positive for headaches, negative for focal weakness or numbness. ____________________________________________   PHYSICAL EXAM:  VITAL SIGNS: ED Triage Vitals  Enc Vitals Group     BP 02/11/18 2014 (!) 158/89     Pulse Rate 02/11/18 2014 84     Resp 02/11/18 2014 20     Temp 02/11/18 2014 98.5 F (36.9 C)     Temp Source 02/11/18 2014 Oral     SpO2 02/11/18 2014 97 %     Weight 02/11/18 2015 172 lb (78 kg)     Height 02/11/18 2015 5\' 6"  (1.676 m)     Head Circumference --      Peak Flow --      Pain Score 02/11/18 2015 8     Pain Loc --      Pain Edu? --      Excl. in  GC? --     Constitutional: Alert and oriented. Well appearing and in no acute distress. Eyes: Conjunctivae are normal. PERRL. EOMI. Head: Atraumatic. Nose: No congestion/rhinnorhea. Mouth/Throat: Mucous membranes are moist.  Oropharynx non-erythematous. Neck: No stridor.   Cardiovascular: Normal rate, regular rhythm. Grossly normal heart sounds.  Good peripheral circulation. Respiratory: Normal respiratory effort.  No retractions. Lungs CTAB. Gastrointestinal: Soft and nontender. No distention. No abdominal bruits. No CVA tenderness. Musculoskeletal: No lower extremity tenderness nor edema.  No joint effusions. Neurologic:  Normal speech and language. No gross focal neurologic deficits are appreciated. No gait instability. Skin:  Skin is warm, dry and intact. No rash noted. Psychiatric: Mood and affect are normal. Speech and behavior are normal.  ____________________________________________   LABS (all labs ordered are listed, but only abnormal results are displayed)  Labs Reviewed  CBC - Abnormal; Notable for the following components:      Result Value   WBC 10.9 (*)    All other components within normal limits  URINALYSIS, COMPLETE (UACMP) WITH MICROSCOPIC - Abnormal; Notable for the following components:   Color, Urine YELLOW (*)    APPearance CLEAR (*)     All other components within normal limits  LIPASE, BLOOD  COMPREHENSIVE METABOLIC PANEL   ____________________________________________  EKG  Not indicated ____________________________________________  RADIOLOGY  ED MD interpretation: N/a  Official radiology report(s): No results found.  ____________________________________________   PROCEDURES  Procedure(s) performed: None  Procedures  Critical Care performed: No  ____________________________________________   INITIAL IMPRESSION / ASSESSMENT AND PLAN / ED COURSE  As part of my medical decision making, I reviewed the following data within the electronic MEDICAL RECORD NUMBER Notes from prior ED visits   62 year old female presenting to the emergency department for treatment and evaluation of abdominal pain, fever, and diarrhea that have been progressively worsening since Wednesday.  She was made aware that some of the food she ate on Wednesday was contaminated with Salmonella.  Today, the diarrhea has slowed down and the vomiting has stopped but the abdominal cramping remains significant.  Labs are fairly reassuring.  Because of the symptoms I recommend a CT of the abdomen pelvis with IV contrast.  The patient was very reluctant due to a previous renal insult after CT scan but states that this was more than 10 years ago.  Advised her that she would receive 1 L of normal saline after contrast which should prevent any kidney injury but the patient  declined.  She states that if the pain does not get any better she will come back and allow a CT scan to be completed.  The patient was given rationale for the test and told of the potential negative outcomes including death if procedures not performed.  She states she will come back if she needs to. She signed out AMA.     ____________________________________________   FINAL CLINICAL IMPRESSION(S) / ED DIAGNOSES  Final diagnoses:  Generalized abdominal pain  Diarrhea,  unspecified type     ED Discharge Orders    None       Note:  This document was prepared using Dragon voice recognition software and may include unintentional dictation errors.    Victorino Dike, FNP 02/12/18 [place], [place], MD 02/15/18 1758

## 2018-02-11 NOTE — Pre-Procedure Instructions (Signed)
Robyn Montes  02/11/2018      [id number] - [place] [address], [place] - [address] [place] [address] Phone: [phone] Fax: [phone]    Your procedure is scheduled on 02/20/2018.  Report to [facility] Admitting at 0800 A.M.  Call this number if you have problems the morning of surgery:  [phone]   Remember:  Do not eat or drink after midnight.  You may drink clear liquids until 0700 .  Clear liquids allowed are:  Water, Juice (non-citric and without pulp), Carbonated beverages, Clear Tea, Black Coffee only and Gatorade    Take these medicines the morning of surgery with A SIP OF WATER: Amoxicillin (Amoxil) Brimonidine tartrate (Lumify) eye drops - if needed Naphazoline HCl (Clear Eyes) - if needed Synthroid  7 days prior to surgery STOP taking any Aspirin (unless otherwise instructed by your surgeon), Aleve, Naproxen, Ibuprofen, Motrin, Advil, Goody's, BC's, all herbal medications, fish oil, and all vitamins  WHAT DO I DO ABOUT MY DIABETES MEDICATION? Marland Kitchen Do not take oral diabetes medicines (Metformin) the morning of surgery.    How to Manage Your Diabetes Before and After Surgery  Why is it important to control my blood sugar before and after surgery? . Improving blood sugar levels before and after surgery helps healing and can limit problems. . A way of improving blood sugar control is eating a healthy diet by: o  Eating less sugar and carbohydrates o  Increasing activity/exercise o  Talking with your doctor about reaching your blood sugar goals . High blood sugars (greater than 180 mg/dL) can raise your risk of infections and slow your recovery, so you will need to focus on controlling your diabetes during the weeks before surgery. . Make sure that the doctor who takes care of your diabetes knows about your planned surgery including the date and location.  How do I manage my blood sugar before surgery? . Check your  blood sugar at least 4 times a day, starting 2 days before surgery, to make sure that the level is not too high or low. o Check your blood sugar the morning of your surgery when you wake up and every 2 hours until you get to the Short Stay unit. . If your blood sugar is less than 70 mg/dL, you will need to treat for low blood sugar: o Do not take insulin. o Treat a low blood sugar (less than 70 mg/dL) with  cup of clear juice (cranberry or apple), 4 glucose tablets, OR glucose gel. o Recheck blood sugar in 15 minutes after treatment (to make sure it is greater than 70 mg/dL). If your blood sugar is not greater than 70 mg/dL on recheck, call [phone] for further instructions. . Report your blood sugar to the short stay nurse when you get to Short Stay.  . If you are admitted to the hospital after surgery: o Your blood sugar will be checked by the staff and you will probably be given insulin after surgery (instead of oral diabetes medicines) to make sure you have good blood sugar levels. o The goal for blood sugar control after surgery is 80-180 mg/dL.      to     Do not wear jewelry, make-up or nail polish.  Do not wear lotions, powders, or perfumes, or deodorant.  Do not shave 48 hours prior to surgery.  Men may shave face and neck.  Do not bring valuables to the hospital.  [facility] is  not responsible for any belongings or valuables.  Contacts, eyeglasses, hearing aids, dentures or bridgework may not be worn into surgery.  Leave your suitcase in the car.  After surgery it may be brought to your room.  For patients admitted to the hospital, discharge time will be determined by your treatment team.  Patients discharged the day of surgery will not be allowed to drive home.   Name and phone number of your driver:    Special instructions:   [place]- Preparing For Surgery  Before surgery, you can play an important role. Because skin is not sterile, your skin needs to be as  free of germs as possible. You can reduce the number of germs on your skin by washing with CHG (chlorahexidine gluconate) Soap before surgery.  CHG is an antiseptic cleaner which kills germs and bonds with the skin to continue killing germs even after washing.    Oral Hygiene is also important to reduce your risk of infection.  Remember - BRUSH YOUR TEETH THE MORNING OF SURGERY WITH YOUR REGULAR TOOTHPASTE  Please do not use if you have an allergy to CHG or antibacterial soaps. If your skin becomes reddened/irritated stop using the CHG.  Do not shave (including legs and underarms) for at least 48 hours prior to first CHG shower. It is OK to shave your face.  Please follow these instructions carefully.   1. Shower the NIGHT BEFORE SURGERY and the MORNING OF SURGERY with CHG.   2. If you chose to wash your hair, wash your hair first as usual with your normal shampoo.  3. After you shampoo, rinse your hair and body thoroughly to remove the shampoo.  4. Use CHG as you would any other liquid soap. You can apply CHG directly to the skin and wash gently with a scrungie or a clean washcloth.   5. Apply the CHG Soap to your body ONLY FROM THE NECK DOWN.  Do not use on open wounds or open sores. Avoid contact with your eyes, ears, mouth and genitals (private parts). Wash Face and genitals (private parts)  with your normal soap.  6. Wash thoroughly, paying special attention to the area where your surgery will be performed.  7. Thoroughly rinse your body with warm water from the neck down.  8. DO NOT shower/wash with your normal soap after using and rinsing off the CHG Soap.  9. Pat yourself dry with a CLEAN TOWEL.  10. Wear CLEAN PAJAMAS to bed the night before surgery, wear comfortable clothes the morning of surgery  11. Place CLEAN SHEETS on your bed the night of your first shower and DO NOT SLEEP WITH PETS.    Day of Surgery: Shower as stated above. Do not apply any deodorants/lotions.   Please wear clean clothes to the hospital/surgery center.   Remember to brush your teeth WITH YOUR REGULAR TOOTHPASTE.    Please read over the following fact sheets that you were given. Pain Booklet, Coughing and Deep Breathing and Surgical Site Infection Prevention

## 2018-02-12 ENCOUNTER — Other Ambulatory Visit: Payer: Self-pay

## 2018-02-12 ENCOUNTER — Encounter (HOSPITAL_COMMUNITY)
Admission: RE | Admit: 2018-02-12 | Discharge: 2018-02-12 | Disposition: A | Discharge status: Home / Self Care | Payer: Medicare HMO | Source: Ambulatory Visit | Attending: Surgery | Admitting: Surgery

## 2018-02-12 ENCOUNTER — Encounter (HOSPITAL_COMMUNITY): Payer: Self-pay | Admitting: Vascular Surgery

## 2018-02-12 ENCOUNTER — Encounter (HOSPITAL_COMMUNITY): Payer: Self-pay

## 2018-02-12 DIAGNOSIS — Z79890 Hormone replacement therapy (postmenopausal): Secondary | ICD-10-CM | POA: Insufficient documentation

## 2018-02-12 DIAGNOSIS — Z01818 Encounter for other preprocedural examination: Secondary | ICD-10-CM | POA: Diagnosis present

## 2018-02-12 DIAGNOSIS — Z79899 Other long term (current) drug therapy: Secondary | ICD-10-CM | POA: Insufficient documentation

## 2018-02-12 DIAGNOSIS — D1739 Benign lipomatous neoplasm of skin and subcutaneous tissue of other sites: Secondary | ICD-10-CM | POA: Diagnosis not present

## 2018-02-12 DIAGNOSIS — E039 Hypothyroidism, unspecified: Secondary | ICD-10-CM | POA: Diagnosis not present

## 2018-02-12 DIAGNOSIS — R7303 Prediabetes: Secondary | ICD-10-CM | POA: Diagnosis not present

## 2018-02-12 DIAGNOSIS — Z7984 Long term (current) use of oral hypoglycemic drugs: Secondary | ICD-10-CM | POA: Insufficient documentation

## 2018-02-12 HISTORY — DX: Prediabetes: R73.03

## 2018-02-12 HISTORY — DX: Sleep apnea, unspecified: G47.30

## 2018-02-12 HISTORY — DX: Family history of other specified conditions: Z84.89

## 2018-02-12 LAB — BASIC METABOLIC PANEL
Anion gap: 8 (ref 5–15)
BUN: 9 mg/dL (ref 6–20)
CO2: 26 mmol/L (ref 22–32)
Calcium: 9.6 mg/dL (ref 8.9–10.3)
Chloride: 107 mmol/L (ref 98–111)
Creatinine, Ser: 0.75 mg/dL (ref 0.44–1.00)
GFR calc Af Amer: >60 mL/min (ref >60)
GFR calc non Af Amer: >60 mL/min (ref >60)
Glucose, Bld: 85 mg/dL (ref 70–99)
Potassium: 3.6 mmol/L (ref 3.5–5.1)
Sodium: 141 mmol/L (ref 135–145)

## 2018-02-12 LAB — CBC
HCT: 40.7 % (ref 36.0–46.0)
Hemoglobin: 12.9 g/dL (ref 12.0–15.0)
MCH: 26.5 pg (ref 26.0–34.0)
MCHC: 31.7 g/dL (ref 30.0–36.0)
MCV: 83.7 fL (ref 80.0–100.0)
Platelets: 234 10*3/uL (ref 150–400)
RBC: 4.86 MIL/uL (ref 3.87–5.11)
RDW: 14.6 % (ref 11.5–15.5)
WBC: 8.6 10*3/uL (ref 4.0–10.5)
nRBC: 0 % (ref 0.0–0.2)

## 2018-02-12 LAB — HEMOGLOBIN A1C
Hgb A1c MFr Bld: 5.7 % — ABNORMAL HIGH (ref 4.8–5.6)
Mean Plasma Glucose: 116.89 mg/dL

## 2018-02-12 MED ORDER — CHLORHEXIDINE GLUCONATE CLOTH 2 % EX PADS: 6.0000 | MEDICATED_PAD | Freq: Once | CUTANEOUS | Status: DC

## 2018-02-12 NOTE — Anesthesia Preprocedure Evaluation (Deleted)
Anesthesia Evaluation    Airway        Dental   Pulmonary           Cardiovascular hypertension,      Neuro/Psych    GI/Hepatic   Endo/Other    Renal/GU      Musculoskeletal   Abdominal   Peds  Hematology   Anesthesia Other Findings   Reproductive/Obstetrics                             Anesthesia Physical Anesthesia Plan  ASA:   Anesthesia Plan:    Post-op Pain Management:    Induction:   PONV Risk Score and Plan:   Airway Management Planned:   Additional Equipment:   Intra-op Plan:   Post-operative Plan:   Informed Consent:   Plan Discussed with:   Anesthesia Plan Comments: (See PAT note written 02/12/2018 by Myra Gianotti, PA-C regarding history and anesthesia concerns.  )        Anesthesia Quick Evaluation

## 2018-02-12 NOTE — Progress Notes (Addendum)
PCP: Lamar Blinks, MD  Cardiologist: Quay Burow, MD  EKG: 05/04/17 in [facility]  Stress test: 04/20/11 results in [facility] Everywhere (stress ECHO)  ECHO: 10/2013 in [facility] and 04/20/11 results  in Care Everywhere  Cardiac Cath: pt denies  Chest x-ray: 05/04/17 in [facility]  Anesthesia consult ordered- Patient has family member's with significant reactions to anesthesia per Dr. Retia Passe note.   Daughter- heart rate dropped with anesthesia  Mother-believes her mother had a mini stroke and her heart rate also dropped during surgery  Was never told anyone was diagnosed with malignant hyperthermia

## 2018-02-12 NOTE — Progress Notes (Addendum)
Anesthesia PAT Evaluation:   Case:  [id number] Date/Time:  02/20/18 0945   Procedure:  EXCISION OF SUBCUTANEOUS LIPOMA UPPER BACK (N/A )   Anesthesia type:  General   Pre-op diagnosis:  SUBCUTANEOUS LIPOMA UPPER BACK   Location:  [facility] ROOM 02 / [facility]   Surgeon:  Donnie Mesa, MD      DISCUSSION: Patient is a 62 year old female scheduled for the above procedure.  History includes never smoker, HTN, hepatitis C (from blood transfusion, s/p Harvoni), palpitations, murmur, exertional dyspnea, iron deficiency anemia, memory loss (does not see neurology). Reports she had mild OSA > 20 years ago and did not tolerate CPAP. - ED evaluation 02/11/18 for right sided abdominal pain, fever, N/V/D x 4-5 days. Reportedly she ate something that was contaminated with Salmonella. Labs reassuring, but she refused CT abd/pelvis with contrast due to renal insult after CT scan 10 years ago. She signed out [place]. (On 02/12/18, she reported abdominal pain and N/V improved. No fever since last week.) - She was seen at her PCP office by an APP on 05/04/17 for palpitations (no chest pain or syncope). EKG showed NSR.  There was mention of cardiology referral, but this never happened. (Patient reports palpitations improved. She takes metoprolol PRN palpitation and/or SBP > ~ 150, but only occasional use. Last about 3 weeks ago.)   Anesthesia consult requested due to family history adverse events with or after anesthesia.  - She specifically mentions bradycardia and slow to wake up from anesthesia in her daughter and mother. There is NO known history of pseudocholinesterase deficiency or malignant hyperthermia in the patient or family. - By her account, her daughter (who was 20 years old at the time) underwent tonsillectomy about seven years ago at a surgical center in [place], [place]. While in PACU her daughter had significant bradycardia. They do not think chest compressions were needed, but epinephrine had to be  given and possibly atropine. They believe her daughter had to be re-intubated and required antibiotics and pulmonology follow-up for several months for nebulizer treatments and incentive spirometry. She or her daughter do not recall the term aspiration pneumonia, but thought the term "partial collapse" of her lung may have been used. Patient knows less details about her mother, but believes the bradycardia was post-operatively. She did not think that her mother required re-intubation. Her mother may have also had a perioperative TIA. She is not aware of any OSA history in her daughter or mother. Patient has never required general anesthesia, but she is somewhat anxious about surgery and anesthesia given her daughter's and mother's experiences.  It sounds like patient's daughter may have had bradycardia related to hypoxia, although I do not have those records available. There is no known diagnosis of familial anesthesia complications. Patient did report a remote diagnosis of OSA that is not currently treated. She will be monitored intraoperatively and in recovery. I updated anesthesiologist Oren Bracket, MD about her history. Anesthesiologist to evaluate on the day of surgery and discuss definitive anesthesia plan.   VS: BP 122/86   Pulse 88   Temp 36.7 C   Resp 18   Ht 5\' 7"  (1.702 m)   Wt 95.7 kg   SpO2 96%   BMI 33.05 kg/m  Patient is a pleasant black female in NAD. Her daughter was with her at PAT. No conversational dyspnea. Lungs clear. Heart RRR, I/VI SEM, heard best on RSB. Minimal ankle edema. Mallampati III. She denied chest pain and SOB at  rest. Has intermittent mild edema which is overall controlled with daily HCTZ. She is prone to abdominal bloating. Reports no significant palpitations since 04/2017. Typically sleeps on 2-3 pillows because she thinks it helps prevent palpitations, but doesn't really think lying flat causes any SOB. She is able to climb two flights of stairs without  chest pain or palpitations, but does have DOE with this level of activity (which is stable for about the past year, but may be slightly worse then > a year ago).    PROVIDERS: Copland, Gay Filler, MD is PCP  She saw cardiologist Quay Burow, MD on 10/03/13 for palpitations and DOE with family history of MI. Echo, event monitor, and exercise Myoview stress test ordered. She never had the stress test in 2015. She had a normal stress echo in 2013.    LABS: Labs reviewed: Acceptable for surgery. (all labs ordered are listed, but only abnormal results are displayed)  Labs Reviewed  HEMOGLOBIN A1C - Abnormal; Notable for the following components:      Result Value   Hgb A1c MFr Bld 5.7 (*)    All other components within normal limits  BASIC METABOLIC PANEL  CBC    IMAGES: CXR 05/04/17: IMPRESSION: No active cardiopulmonary disease.   EKG: 05/04/17: NSR. Baseline wanderer.   CV: Echo 10/16/13: Study Conclusions - Left ventricle: The cavity size was normal. Wall thickness was normal. Systolic function was normal. The estimated ejection fraction was in the range of 55% to 60%. Wall motion was normal; there were no regional wall motion abnormalities. Doppler parameters are consistent with abnormal left ventricular relaxation (grade 1 diastolic dysfunction). Impressions: - Normal LV function; grade 1 diastolic dysfunction; trace MR and TR.  Cardiac monitor 10/04/13-10/28/13: Prescription days 30. Monitored days 5. Total monitoring time 87 hours 43 minutes.  Highest HR 135 bpm. Lowest HR 45 bpm. Average HR 65 bpm.   Stress echo 04/20/11 ([facility]): Interpretation: Normal Stress Test. Note: TRIVIAL MR AND MILD TR GRADE 1 DIASTOLIC DYSFUNCTION   Past Medical History:  Diagnosis Date  . Anemia 1976, 1984, 2001, 2003   history of multiple transfusions (3); iron deficiency  . Arthritis    hands, knees B; DDD lumbar  . Blood transfusion without reported  diagnosis   . Family history of adverse reaction to anesthesia    pt's mother and daughter both had significant heart rate drops with anesthesia, believes her mother may have had a mini stroke while under anesthesia  . Family history of heart disease   . Heart murmur   . Hepatitis C 04/17/2013   acquired from blood transfusion; s/p Harvano treatment [facility] in [place].  Marland Kitchen HTN (hypertension)   . Hypothyroid   . Memory loss 08/21/2014  . Palpitations   . Pre-diabetes    on metformin for prediabetes  . Shortness of breath   . Sleep apnea    tested over 20 year ago, does not use cpap per patient    Past Surgical History:  Procedure Laterality Date  . LIVER BIOPSY      MEDICATIONS: . amoxicillin (AMOXIL) 500 MG capsule  . Brimonidine Tartrate (LUMIFY) 0.025 % SOLN  . clindamycin-benzoyl peroxide (BENZACLIN) gel  . donepezil (ARICEPT) 10 MG tablet  . EPINEPHrine 0.3 mg/0.3 mL IJ SOAJ injection  . Fluocinolone Acetonide Scalp 0.01 % OIL  . furosemide (LASIX) 20 MG tablet  . hydroquinone 4 % cream  . losartan-hydrochlorothiazide (HYZAAR) 50-12.5 MG tablet  . lovastatin (MEVACOR) 20 MG tablet  .  metFORMIN (GLUCOPHAGE) 500 MG tablet  . metoprolol succinate (TOPROL-XL) 50 MG 24 hr tablet  . Naphazoline HCl (CLEAR EYES OP)  . potassium chloride (K-DUR) 10 MEQ tablet  . sertraline (ZOLOFT) 50 MG tablet  . SYNTHROID 100 MCG tablet  . tretinoin (RETIN-A) 0.025 % cream  . Vitamin D, Ergocalciferol, (DRISDOL) 50000 units CAPS capsule   . Chlorhexidine Gluconate Cloth 2 % PADS 6 each   And  . Chlorhexidine Gluconate Cloth 2 % PADS 6 each  Not taking Aricept, Lasix, fluocinolone, lovastatin, K-dur, Zoloft, vitamin D, Benzaclin gel. She reports Lasix discontinued once she was started on losartan/HCTZ. She takes Toprol Q day PRN palpitations and/or SBP > ~ 150. Her last dose of Amoxicillin is 02/12/18--was treated for 10 days following dental work.   George Hugh [facility]  Stay Center/Anesthesiology Phone [phone] 02/12/2018 6:25 PM

## 2018-02-15 ENCOUNTER — Other Ambulatory Visit: Payer: Self-pay | Admitting: Family Medicine

## 2018-02-15 DIAGNOSIS — E038 Other specified hypothyroidism: Secondary | ICD-10-CM

## 2018-02-20 ENCOUNTER — Ambulatory Visit (HOSPITAL_COMMUNITY): Admission: RE | Admit: 2018-02-20 | Payer: Medicare HMO | Source: Ambulatory Visit | Admitting: Surgery

## 2018-02-20 ENCOUNTER — Encounter (HOSPITAL_COMMUNITY): Admission: RE | Payer: Self-pay | Source: Ambulatory Visit

## 2018-02-20 SURGERY — EXCISION LIPOMA
Anesthesia: General

## 2018-03-01 ENCOUNTER — Other Ambulatory Visit: Payer: Self-pay | Admitting: Family Medicine

## 2018-03-01 DIAGNOSIS — E038 Other specified hypothyroidism: Secondary | ICD-10-CM

## 2018-03-01 MED ORDER — SYNTHROID 100 MCG PO TABS: 100.0000 ug | ORAL_TABLET | Freq: Every day | ORAL | 0 refills | Status: DC

## 2018-03-01 NOTE — Addendum Note (Signed)
Addended by: Wynonia Musty A on: 03/01/2018 03:59 PM   Modules accepted: Orders

## 2018-03-07 ENCOUNTER — Other Ambulatory Visit: Payer: Self-pay | Admitting: Family Medicine

## 2018-03-07 DIAGNOSIS — I10 Essential (primary) hypertension: Secondary | ICD-10-CM

## 2018-03-12 ENCOUNTER — Other Ambulatory Visit: Payer: Self-pay | Admitting: Family Medicine

## 2018-03-22 ENCOUNTER — Other Ambulatory Visit: Payer: Self-pay | Admitting: Family Medicine

## 2018-04-04 ENCOUNTER — Other Ambulatory Visit: Payer: Self-pay | Admitting: Family Medicine

## 2018-04-04 DIAGNOSIS — E038 Other specified hypothyroidism: Secondary | ICD-10-CM

## 2018-04-08 ENCOUNTER — Other Ambulatory Visit: Payer: Self-pay | Admitting: Family Medicine

## 2018-04-08 DIAGNOSIS — R7303 Prediabetes: Secondary | ICD-10-CM

## 2018-04-10 NOTE — Progress Notes (Addendum)
[facility] [address] [place], [place] [address] [phone]  Date:  04/15/2018   Name:  Robyn Montes   DOB:  June 11, 1955   MRN:  [id number]  PCP:  Darreld Mclean, MD    Chief Complaint: Annual Exam (med refill-90 days, pap)   History of Present Illness:  Robyn Montes is a 62 y.o. very pleasant female patient who presents with the following:  Here today for a CPE History of hep C, HTN, hypothyroidism, memory concerns, hyperlipidemia, pre-diaetes  Pap: a year ago, ASCUS but negative HPV.  Will need to repeat today Mammo: just done today  Colon: she did do a virtual colonoscopy in 2016 which was ok.  She would like to do cologuard  Labs: she did have a few chips so far this am  Immun: will give flu shot today  Declines Shingrix he does okay I have the right hand  She does have gallstones and has noted abdominal pain off and on  She states that the pain can be quite severe, but is very sporadic May last 4-5 minutes She is not sure of any relationship to eating No vomiting Last occurrence of pain was about 1 month ago  Judeen Hammans does have some concern about her memory, which we have discussed in the past.  She has been to see neurology but it has been quite some time-I will refer her back for another evaluation  I visit today was complicated by the patient being on her phone with her cell phone carrier off and on during her visit This limited our ability to do screening and other interview questions  Patient Active Problem List   Diagnosis Date Noted  . Hepatitis B immune 04/04/2016  . Hypothyroidism 10/01/2014  . Memory loss 08/21/2014  . Chronic hepatitis C ([place]) 08/04/2014  . History of other specified conditions presenting hazards to health 08/04/2014  . Palpitations 10/03/2013  . Essential hypertension 10/03/2013  . Biliary calculi 01/17/2013    Past Medical History:  Diagnosis Date  .  Anemia 1976, 1984, 2001, 2003   history of multiple transfusions (3); iron deficiency  . Arthritis    hands, knees B; DDD lumbar  . Blood transfusion without reported diagnosis   . Family history of adverse reaction to anesthesia    pt's mother and daughter both had significant heart rate drops with anesthesia, believes her mother may have had a mini stroke while under anesthesia  . Family history of heart disease   . Heart murmur   . Hepatitis C 04/17/2013   acquired from blood transfusion; s/p Harvano treatment [facility] in [place].  Marland Kitchen HTN (hypertension)   . Hypothyroid   . Memory loss 08/21/2014  . Palpitations   . Pre-diabetes    on metformin for prediabetes  . Shortness of breath   . Sleep apnea    tested over 20 year ago, does not use cpap per patient    Past Surgical History:  Procedure Laterality Date  . LIVER BIOPSY      Social History   Tobacco Use  . Smoking status: Never Smoker  . Smokeless tobacco: Never Used  Substance Use Topics  . Alcohol use: No  . Drug use: No    Family History  Problem Relation Age of Onset  . Heart disease Mother        heart attack and stroke; passed away in her 36s  . Diabetes  Mother   . Hyperlipidemia Mother   . Hypertension Mother   . Stroke Mother   . Heart disease Father        passed away in his 38s  . Hyperlipidemia Father   . Diabetes Brother   . Heart disease Brother   . Hypertension Brother   . Diabetes Brother   . Hypertension Sister     Allergies  Allergen Reactions  . Bee Venom Other (See Comments)    Unknown    Medication list has been reviewed and updated.  Current Outpatient Medications on File Prior to Visit  Medication Sig Dispense Refill  . Brimonidine Tartrate (LUMIFY) 0.025 % SOLN Place 1-2 drops into both eyes daily as needed (for dry eyes).    . clindamycin-benzoyl peroxide (BENZACLIN) gel Apply topically 2 (two) times daily. 25 g 6  . donepezil (ARICEPT) 10 MG tablet Take 1 tablet (10 mg  total) by mouth at bedtime. May increase to 2 pills after 4 weeks 30 tablet 5  . EPINEPHrine 0.3 mg/0.3 mL IJ SOAJ injection Inject 0.3 mLs (0.3 mg total) into the skin once as needed for up to 1 dose. (Patient taking differently: Inject 0.3 mg into the skin once. ) 2 Device 3  . Fluocinolone Acetonide Scalp 0.01 % OIL Apply to scalp and leave on 4 hours or overnight, repeat twice a week as needed 118 mL 3  . furosemide (LASIX) 20 MG tablet Take 1 tablet (20 mg total) by mouth daily. 30 tablet 1  . hydroquinone 4 % cream APPLY 1 APPLICATION TOPICALLY DAILY. USE FOR 2-3 WEEKS ONLY 28.35 g 0  . lovastatin (MEVACOR) 20 MG tablet Take 1 tablet (20 mg total) at bedtime by mouth. 90 tablet 3  . metFORMIN (GLUCOPHAGE) 500 MG tablet TAKE 1 TABLET BY MOUTH EVERY DAY WITH BREAKFAST 30 tablet 2  . metoprolol succinate (TOPROL-XL) 50 MG 24 hr tablet TAKE 1 TABLET BY MOUTH DAILY. TAKE WITH OR IMMEDIATELY FOLLOWING A MEAL. 30 tablet 0  . Naphazoline HCl (CLEAR EYES OP) Place 2 drops into both eyes daily as needed (for dry eyes).    . potassium chloride (K-DUR) 10 MEQ tablet Take 1 tablet (10 mEq total) by mouth daily. 30 tablet 1  . sertraline (ZOLOFT) 50 MG tablet Take 1 tablet (50 mg total) by mouth daily. 90 tablet 3  . SYNTHROID 100 MCG tablet TAKE 1 TABLET (100 MCG TOTAL) BY MOUTH DAILY BEFORE BREAKFAST. 30 tablet 0  . tretinoin (RETIN-A) 0.025 % cream Apply topically at bedtime. 45 g 0  . Vitamin D, Ergocalciferol, (DRISDOL) 50000 units CAPS capsule TAKE ONE CAPSULE BY MOUTH ONCE A WEEK 12 capsule 0   No current facility-administered medications on file prior to visit.     Review of Systems: As per HPI- otherwise negative.  Physical Examination: Vitals:   04/15/18 1306  BP: 122/84  Pulse: 83  Resp: 16  SpO2: 98%   Vitals:   04/15/18 1306  Weight: 209 lb (94.8 kg)  Height: 5\' 7"  (1.702 m)   Body mass index is 32.73 kg/m. Ideal Body Weight: Weight in (lb) to have BMI = 25: 159.3  GEN:  WDWN, NAD, Non-toxic, A & O x 3, obese, looks well HEENT: Atraumatic, Normocephalic. Neck supple. No masses, No LAD.  Bilateral TM wnl, oropharynx normal.  PEERL,EOMI.   Ears and Nose: No external deformity. CV: RRR, No M/G/R. No JVD. No thrill. No extra heart sounds. PULM: CTA B, no wheezes, crackles, rhonchi. No retractions.  No resp. distress. No accessory muscle use. ABD: S, ND, +BS. No rebound. No HSM.  Very mild right upper quadrant tenderness on exam today EXTR: No c/c/e NEURO Normal gait.  PSYCH: Normally interactive. Conversant. Not depressed or anxious appearing.  Calm demeanor.  Breast: normal exam, no masses/ dimpling/ discharge Pelvic: normal, no vaginal lesions or discharge. Uterus normal, no CMT, no adnexal tendereness or masses    Assessment and Plan: Physical exam  Screening for cervical cancer - Plan: Cytology - PAP  Screening for colon cancer  Screening for hyperlipidemia - Plan: Lipid panel  Pre-diabetes - Plan: Hemoglobin A1c  Essential hypertension - Plan: CBC, Comprehensive metabolic panel  Need for influenza vaccination - Plan: Flu Vaccine QUAD 6+ mos PF IM (Fluarix Quad PF)  Gallstones - Plan: US Abdomen Limited RUQ  Memory loss - Plan: Ambulatory referral to Neurology  Here for complete physical exam today. We will repeat Pap today, as her Pap last year was positive for ASCUS but HPV negative. Flu shot given. Lyza has concerns about her memory, and does tend to display some forgetfulness and inappropriate behaviors such as being on the phone during today's visit. We will arrange for neurology consultation for her. She notes concern of intermittent abdominal pain, which she likens in intensity to childbirth.  This has been going on for 2 years per her report, and she had not mentioned to me in the past that I can recall We will set up for an ultrasound to evaluate her gallbladder Advised that if she is having symptomatic gallstones, we would  recommend an elective cholecystectomy. She will let me know if any change or worsening of her pain in the meantime  Signed Lamar Blinks, MD  Received her labs 12/31 Message to pt Await pap The 10-year ASCVD risk score Mikey Bussing DC Brooke Bonito., et al., 2013) is: 6.3%   Values used to calculate the score:     Age: 81 years     Sex: Female     Is Non-Hispanic African American: Yes     Diabetic: No     Tobacco smoker: No     Systolic Blood Pressure: 623 mmHg     Is BP treated: Yes     HDL Cholesterol: 39.5 mg/dL     Total Cholesterol: 164 mg/dL  Results for orders placed or performed in visit on 04/15/18  CBC  Result Value Ref Range   WBC 10.1 4.0 - 10.5 K/uL   RBC 4.83 3.87 - 5.11 Mil/uL   Platelets 240.0 150.0 - 400.0 K/uL   Hemoglobin 13.1 12.0 - 15.0 g/dL   HCT 40.2 36.0 - 46.0 %   MCV 83.4 78.0 - 100.0 fl   MCHC 32.5 30.0 - 36.0 g/dL   RDW 15.0 11.5 - 15.5 %  Comprehensive metabolic panel  Result Value Ref Range   Sodium 143 135 - 145 mEq/L   Potassium 4.2 3.5 - 5.1 mEq/L   Chloride 104 96 - 112 mEq/L   CO2 31 19 - 32 mEq/L   Glucose, Bld 78 70 - 99 mg/dL   BUN 9 6 - 23 mg/dL   Creatinine, Ser 0.81 0.40 - 1.20 mg/dL   Total Bilirubin 0.4 0.2 - 1.2 mg/dL   Alkaline Phosphatase 58 39 - 117 U/L   AST 15 0 - 37 U/L   ALT 9 0 - 35 U/L   Total Protein 7.7 6.0 - 8.3 g/dL   Albumin 4.6 3.5 - 5.2 g/dL   Calcium 10.1 8.4 -  10.5 mg/dL   GFR 92.43 >60.00 mL/min  Hemoglobin A1c  Result Value Ref Range   Hgb A1c MFr Bld 5.9 4.6 - 6.5 %  Lipid panel  Result Value Ref Range   Cholesterol 164 0 - 200 mg/dL   Triglycerides 82.0 0.0 - 149.0 mg/dL   HDL 39.50 >39.00 mg/dL   VLDL 16.4 0.0 - 40.0 mg/dL   LDL Cholesterol 108 (H) 0 - 99 mg/dL   Total CHOL/HDL Ratio 4    NonHDL 124.28   Cytology - PAP  Result Value Ref Range   Adequacy      Satisfactory for evaluation  endocervical/transformation zone component PRESENT.   Diagnosis      NEGATIVE FOR INTRAEPITHELIAL LESIONS OR  MALIGNANCY.   HPV NOT DETECTED    Material Submitted CervicoVaginal Pap [ThinPrep Imaged]    CYTOLOGY - PAP PAP RESULT     04/17/18 Received her Pap, it is negative Message to patient  Patient had ASC [place] Pap last year, but negative HPV She can now return to routine screening

## 2018-04-15 ENCOUNTER — Encounter: Payer: Self-pay | Admitting: Neurology

## 2018-04-15 ENCOUNTER — Other Ambulatory Visit (HOSPITAL_COMMUNITY)
Admission: RE | Admit: 2018-04-15 | Discharge: 2018-04-15 | Disposition: A | Discharge status: Home / Self Care | Payer: Medicare HMO | Source: Ambulatory Visit | Attending: Family Medicine | Admitting: Family Medicine

## 2018-04-15 ENCOUNTER — Encounter: Payer: Self-pay | Admitting: Family Medicine

## 2018-04-15 ENCOUNTER — Ambulatory Visit (INDEPENDENT_AMBULATORY_CARE_PROVIDER_SITE_OTHER): Payer: Medicare HMO | Admitting: Family Medicine

## 2018-04-15 ENCOUNTER — Ambulatory Visit (HOSPITAL_BASED_OUTPATIENT_CLINIC_OR_DEPARTMENT_OTHER)
Admission: RE | Admit: 2018-04-15 | Discharge: 2018-04-15 | Disposition: A | Discharge status: Home / Self Care | Payer: Medicare HMO | Source: Ambulatory Visit | Attending: Family Medicine | Admitting: Family Medicine

## 2018-04-15 VITALS — BP 122/84 | HR 83 | Resp 16 | Ht 67.0 in | Wt 209.0 lb

## 2018-04-15 DIAGNOSIS — Z23 Encounter for immunization: Secondary | ICD-10-CM | POA: Diagnosis not present

## 2018-04-15 DIAGNOSIS — R413 Other amnesia: Secondary | ICD-10-CM

## 2018-04-15 DIAGNOSIS — Z124 Encounter for screening for malignant neoplasm of cervix: Secondary | ICD-10-CM

## 2018-04-15 DIAGNOSIS — Z0000 Encounter for general adult medical examination without abnormal findings: Secondary | ICD-10-CM | POA: Diagnosis not present

## 2018-04-15 DIAGNOSIS — R7303 Prediabetes: Secondary | ICD-10-CM | POA: Diagnosis not present

## 2018-04-15 DIAGNOSIS — Z1231 Encounter for screening mammogram for malignant neoplasm of breast: Secondary | ICD-10-CM | POA: Diagnosis present

## 2018-04-15 DIAGNOSIS — Z13220 Encounter for screening for lipoid disorders: Secondary | ICD-10-CM | POA: Diagnosis not present

## 2018-04-15 DIAGNOSIS — K8020 Calculus of gallbladder without cholecystitis without obstruction: Secondary | ICD-10-CM

## 2018-04-15 DIAGNOSIS — I10 Essential (primary) hypertension: Secondary | ICD-10-CM

## 2018-04-15 DIAGNOSIS — Z1211 Encounter for screening for malignant neoplasm of colon: Secondary | ICD-10-CM

## 2018-04-15 NOTE — Patient Instructions (Addendum)
It was good to see you today, I will be in touch with your labs ASAP. We did a Pap today and I will be in touch of this report as well. Flu shot today. We will set up Cologuard testing for you, you should receive this kit at your home.  Follow the instructions and send in a sample, and they will check for any sign of colon cancer.  I will set you up for an ultrasound of your gallbladder.  Assuming you still have gallstones, I will plan to have you see a general surgeon to discuss an elective  I will also refer you back to neurology to discuss your memory concerns  Health Maintenance, Female Adopting a healthy lifestyle and getting preventive care can go a long way to promote health and wellness. Talk with your health care provider about what schedule of regular examinations is right for you. This is a good chance for you to check in with your provider about disease prevention and staying healthy. In between checkups, there are plenty of things you can do on your own. Experts have done a lot of research about which lifestyle changes and preventive measures are most likely to keep you healthy. Ask your health care provider for more information. Weight and diet Eat a healthy diet  Be sure to include plenty of vegetables, fruits, low-fat dairy products, and lean protein.  Do not eat a lot of foods high in solid fats, added sugars, or salt.  Get regular exercise. This is one of the most important things you can do for your health. ? Most adults should exercise for at least 150 minutes each week. The exercise should increase your heart rate and make you sweat (moderate-intensity exercise). ? Most adults should also do strengthening exercises at least twice a week. This is in addition to the moderate-intensity exercise. Maintain a healthy weight  Body mass index (BMI) is a measurement that can be used to identify possible weight problems. It estimates body fat based on height and weight. Your health  care provider can help determine your BMI and help you achieve or maintain a healthy weight.  For females 9 years of age and older: ? A BMI below 18.5 is considered underweight. ? A BMI of 18.5 to 24.9 is normal. ? A BMI of 25 to 29.9 is considered overweight. ? A BMI of 30 and above is considered obese. Watch levels of cholesterol and blood lipids  You should start having your blood tested for lipids and cholesterol at 61 years of age, then have this test every 5 years.  You may need to have your cholesterol levels checked more often if: ? Your lipid or cholesterol levels are high. ? You are older than 61 years of age. ? You are at high risk for heart disease. Cancer screening Lung Cancer  Lung cancer screening is recommended for adults 55-66 years old who are at high risk for lung cancer because of a history of smoking.  A yearly low-dose CT scan of the lungs is recommended for people who: ? Currently smoke. ? Have quit within the past 15 years. ? Have at least a 30-pack-year history of smoking. A pack year is smoking an average of one pack of cigarettes a day for 1 year.  Yearly screening should continue until it has been 15 years since you quit.  Yearly screening should stop if you develop a health problem that would prevent you from having lung cancer treatment. Breast Cancer  Practice breast self-awareness. This means understanding how your breasts normally appear and feel.  It also means doing regular breast self-exams. Let your health care provider know about any changes, no matter how small.  If you are in your 20s or 30s, you should have a clinical breast exam (CBE) by a health care provider every 1-3 years as part of a regular health exam.  If you are 23 or older, have a CBE every year. Also consider having a breast X-ray (mammogram) every year.  If you have a family history of breast cancer, talk to your health care provider about genetic screening.  If you are at  high risk for breast cancer, talk to your health care provider about having an MRI and a mammogram every year.  Breast cancer gene (BRCA) assessment is recommended for women who have family members with BRCA-related cancers. BRCA-related cancers include: ? Breast. ? Ovarian. ? Tubal. ? Peritoneal cancers.  Results of the assessment will determine the need for genetic counseling and BRCA1 and BRCA2 testing. Cervical Cancer Your health care provider may recommend that you be screened regularly for cancer of the pelvic organs (ovaries, uterus, and vagina). This screening involves a pelvic examination, including checking for microscopic changes to the surface of your cervix (Pap test). You may be encouraged to have this screening done every 3 years, beginning at age 47.  For women ages 26-65, health care providers may recommend pelvic exams and Pap testing every 3 years, or they may recommend the Pap and pelvic exam, combined with testing for human papilloma virus (HPV), every 5 years. Some types of HPV increase your risk of cervical cancer. Testing for HPV may also be done on women of any age with unclear Pap test results.  Other health care providers may not recommend any screening for nonpregnant women who are considered low risk for pelvic cancer and who do not have symptoms. Ask your health care provider if a screening pelvic exam is right for you.  If you have had past treatment for cervical cancer or a condition that could lead to cancer, you need Pap tests and screening for cancer for at least 20 years after your treatment. If Pap tests have been discontinued, your risk factors (such as having a new sexual partner) need to be reassessed to determine if screening should resume. Some women have medical problems that increase the chance of getting cervical cancer. In these cases, your health care provider may recommend more frequent screening and Pap tests. Colorectal Cancer  This type of cancer  can be detected and often prevented.  Routine colorectal cancer screening usually begins at 61 years of age and continues through 61 years of age.  Your health care provider may recommend screening at an earlier age if you have risk factors for colon cancer.  Your health care provider may also recommend using home test kits to check for hidden blood in the stool.  A small camera at the end of a tube can be used to examine your colon directly (sigmoidoscopy or colonoscopy). This is done to check for the earliest forms of colorectal cancer.  Routine screening usually begins at age 74.  Direct examination of the colon should be repeated every 5-10 years through 61 years of age. However, you may need to be screened more often if early forms of precancerous polyps or small growths are found. Skin Cancer  Check your skin from head to toe regularly.  Tell your health care provider about any  new moles or changes in moles, especially if there is a change in a mole's shape or color.  Also tell your health care provider if you have a mole that is larger than the size of a pencil eraser.  Always use sunscreen. Apply sunscreen liberally and repeatedly throughout the day.  Protect yourself by wearing long sleeves, pants, a wide-brimmed hat, and sunglasses whenever you are outside. Heart disease, diabetes, and high blood pressure  High blood pressure causes heart disease and increases the risk of stroke. High blood pressure is more likely to develop in: ? People who have blood pressure in the high end of the normal range (130-139/85-89 mm Hg). ? People who are overweight or obese. ? People who are African American.  If you are 48-63 years of age, have your blood pressure checked every 3-5 years. If you are 44 years of age or older, have your blood pressure checked every year. You should have your blood pressure measured twice-once when you are at a hospital or clinic, and once when you are not at a  hospital or clinic. Record the average of the two measurements. To check your blood pressure when you are not at a hospital or clinic, you can use: ? An automated blood pressure machine at a pharmacy. ? A home blood pressure monitor.  If you are between 27 years and 38 years old, ask your health care provider if you should take aspirin to prevent strokes.  Have regular diabetes screenings. This involves taking a blood sample to check your fasting blood sugar level. ? If you are at a normal weight and have a low risk for diabetes, have this test once every three years after 61 years of age. ? If you are overweight and have a high risk for diabetes, consider being tested at a younger age or more often. Preventing infection Hepatitis B  If you have a higher risk for hepatitis B, you should be screened for this virus. You are considered at high risk for hepatitis B if: ? You were born in a country where hepatitis B is common. Ask your health care provider which countries are considered high risk. ? Your parents were born in a high-risk country, and you have not been immunized against hepatitis B (hepatitis B vaccine). ? You have HIV or AIDS. ? You use needles to inject street drugs. ? You live with someone who has hepatitis B. ? You have had sex with someone who has hepatitis B. ? You get hemodialysis treatment. ? You take certain medicines for conditions, including cancer, organ transplantation, and autoimmune conditions. Hepatitis C  Blood testing is recommended for: ? Everyone born from [birth year] through 1964. ? Anyone with known risk factors for hepatitis C. Sexually transmitted infections (STIs)  You should be screened for sexually transmitted infections (STIs) including gonorrhea and chlamydia if: ? You are sexually active and are younger than 61 years of age. ? You are older than 61 years of age and your health care provider tells you that you are at risk for this type of  infection. ? Your sexual activity has changed since you were last screened and you are at an increased risk for chlamydia or gonorrhea. Ask your health care provider if you are at risk.  If you do not have HIV, but are at risk, it may be recommended that you take a prescription medicine daily to prevent HIV infection. This is called pre-exposure prophylaxis (PrEP). You are considered at risk if: ?  You are sexually active and do not regularly use condoms or know the HIV status of your partner(s). ? You take drugs by injection. ? You are sexually active with a partner who has HIV. Talk with your health care provider about whether you are at high risk of being infected with HIV. If you choose to begin PrEP, you should first be tested for HIV. You should then be tested every 3 months for as long as you are taking PrEP. Pregnancy  If you are premenopausal and you may become pregnant, ask your health care provider about preconception counseling.  If you may become pregnant, take 400 to 800 micrograms (mcg) of folic acid every day.  If you want to prevent pregnancy, talk to your health care provider about birth control (contraception). Osteoporosis and menopause  Osteoporosis is a disease in which the bones lose minerals and strength with aging. This can result in serious bone fractures. Your risk for osteoporosis can be identified using a bone density scan.  If you are 23 years of age or older, or if you are at risk for osteoporosis and fractures, ask your health care provider if you should be screened.  Ask your health care provider whether you should take a calcium or vitamin D supplement to lower your risk for osteoporosis.  Menopause may have certain physical symptoms and risks.  Hormone replacement therapy may reduce some of these symptoms and risks. Talk to your health care provider about whether hormone replacement therapy is right for you. Follow these instructions at home:  Schedule  regular health, dental, and eye exams.  Stay current with your immunizations.  Do not use any tobacco products including cigarettes, chewing tobacco, or electronic cigarettes.  If you are pregnant, do not drink alcohol.  If you are breastfeeding, limit how much and how often you drink alcohol.  Limit alcohol intake to no more than 1 drink per day for nonpregnant women. One drink equals 12 ounces of beer, 5 ounces of wine, or 1 ounces of hard liquor.  Do not use street drugs.  Do not share needles.  Ask your health care provider for help if you need support or information about quitting drugs.  Tell your health care provider if you often feel depressed.  Tell your health care provider if you have ever been abused or do not feel safe at home. This information is not intended to replace advice given to you by your health care provider. Make sure you discuss any questions you have with your health care provider. Document Released: 10/17/2010 Document Revised: 09/09/2015 Document Reviewed: 01/05/2015 Elsevier Interactive Patient Education  2019 [employer].

## 2018-04-16 ENCOUNTER — Other Ambulatory Visit: Payer: Self-pay

## 2018-04-16 ENCOUNTER — Encounter: Payer: Self-pay | Admitting: Family Medicine

## 2018-04-16 DIAGNOSIS — I10 Essential (primary) hypertension: Secondary | ICD-10-CM

## 2018-04-16 LAB — COMPREHENSIVE METABOLIC PANEL
ALT: 9 U/L (ref 0–35)
AST: 15 U/L (ref 0–37)
Albumin: 4.6 g/dL (ref 3.5–5.2)
Alkaline Phosphatase: 58 U/L (ref 39–117)
BUN: 9 mg/dL (ref 6–23)
CO2: 31 mEq/L (ref 19–32)
Calcium: 10.1 mg/dL (ref 8.4–10.5)
Chloride: 104 mEq/L (ref 96–112)
Creatinine, Ser: 0.81 mg/dL (ref 0.40–1.20)
GFR: 92.43 mL/min (ref >60.00)
Glucose, Bld: 78 mg/dL (ref 70–99)
Potassium: 4.2 mEq/L (ref 3.5–5.1)
Sodium: 143 mEq/L (ref 135–145)
Total Bilirubin: 0.4 mg/dL (ref 0.2–1.2)
Total Protein: 7.7 g/dL (ref 6.0–8.3)

## 2018-04-16 LAB — HEMOGLOBIN A1C: Hgb A1c MFr Bld: 5.9 % (ref 4.6–6.5)

## 2018-04-16 LAB — CYTOLOGY - PAP
Diagnosis: NEGATIVE
HPV: NOT DETECTED

## 2018-04-16 LAB — LIPID PANEL
Cholesterol: 164 mg/dL (ref 0–200)
HDL: 39.5 mg/dL (ref >39.00)
LDL Cholesterol: 108 mg/dL — ABNORMAL HIGH (ref 0–99)
NonHDL: 124.28
Total CHOL/HDL Ratio: 4
Triglycerides: 82 mg/dL (ref 0.0–149.0)
VLDL: 16.4 mg/dL (ref 0.0–40.0)

## 2018-04-16 LAB — CBC
HCT: 40.2 % (ref 36.0–46.0)
Hemoglobin: 13.1 g/dL (ref 12.0–15.0)
MCHC: 32.5 g/dL (ref 30.0–36.0)
MCV: 83.4 fl (ref 78.0–100.0)
Platelets: 240 10*3/uL (ref 150.0–400.0)
RBC: 4.83 Mil/uL (ref 3.87–5.11)
RDW: 15 % (ref 11.5–15.5)
WBC: 10.1 10*3/uL (ref 4.0–10.5)

## 2018-04-16 MED ORDER — LOSARTAN POTASSIUM-HCTZ 50-12.5 MG PO TABS: 1.0000 | ORAL_TABLET | Freq: Two times a day (BID) | ORAL | 1 refills | Status: DC

## 2018-04-17 ENCOUNTER — Other Ambulatory Visit: Payer: Self-pay | Admitting: Family Medicine

## 2018-04-17 ENCOUNTER — Encounter: Payer: Self-pay | Admitting: Family Medicine

## 2018-04-17 DIAGNOSIS — E038 Other specified hypothyroidism: Secondary | ICD-10-CM

## 2018-04-19 MED ORDER — SYNTHROID 100 MCG PO TABS: 100.0000 ug | ORAL_TABLET | Freq: Every day | ORAL | 1 refills | Status: DC

## 2018-04-19 NOTE — Addendum Note (Signed)
Addended by: Wynonia Musty A on: 04/19/2018 09:04 AM   Modules accepted: Orders

## 2018-04-22 ENCOUNTER — Other Ambulatory Visit: Payer: Self-pay | Admitting: Family Medicine

## 2018-04-23 ENCOUNTER — Telehealth: Payer: Self-pay | Admitting: Family Medicine

## 2018-04-23 ENCOUNTER — Telehealth: Payer: Self-pay

## 2018-04-23 ENCOUNTER — Ambulatory Visit (HOSPITAL_BASED_OUTPATIENT_CLINIC_OR_DEPARTMENT_OTHER): Payer: Medicare HMO

## 2018-04-23 MED ORDER — METOPROLOL SUCCINATE ER 50 MG PO TB24: ORAL_TABLET | ORAL | 1 refills | Status: DC

## 2018-04-23 NOTE — Telephone Encounter (Signed)
Please see mychart message I sent to patient today.

## 2018-04-23 NOTE — Addendum Note (Signed)
Addended by: Wynonia Musty A on: 04/23/2018 08:24 AM   Modules accepted: Orders

## 2018-04-23 NOTE — Telephone Encounter (Signed)
Copied from [place] [id number]. Topic: General - Other >> Apr 23, 2018  8:48 AM Lennox Solders wrote: Reason for CRM:pt is having abd ultrasound today for gallbladder issues. Pt said dr copland is aware she is having upper back/arm pain on left side and would like test(image)  order for today

## 2018-04-23 NOTE — Telephone Encounter (Signed)
Copied from [place] [phone]. Topic: Quick Communication - Rx Refill/Question >> Apr 23, 2018  2:32 PM Margot Ables wrote: Medication: metoprolol succinate (TOPROL-XL) 50 MG 24 hr tablet - Vicente Males states the [employer] needs to be corrected for specific use. Please resend  Has the patient contacted their pharmacy? yes Preferred Pharmacy (with phone number or street name): [id number] [place], [place] [phone] (Phone) [phone] (Fax)

## 2018-04-24 MED ORDER — METOPROLOL SUCCINATE ER 50 MG PO TB24: ORAL_TABLET | ORAL | 3 refills | Status: DC

## 2018-04-24 NOTE — Telephone Encounter (Signed)
Please advise on sig of patient's prescription. Does she take one or two tablets after meal? Once daily?

## 2018-04-24 NOTE — Telephone Encounter (Signed)
Clarified.

## 2018-04-25 ENCOUNTER — Emergency Department
Admission: EM | Admit: 2018-04-25 | Discharge: 2018-04-25 | Disposition: A | Discharge status: Home / Self Care | Payer: Medicare HMO | Attending: Emergency Medicine | Admitting: Emergency Medicine

## 2018-04-25 ENCOUNTER — Emergency Department: Payer: Medicare HMO

## 2018-04-25 ENCOUNTER — Encounter: Payer: Self-pay | Admitting: Emergency Medicine

## 2018-04-25 ENCOUNTER — Other Ambulatory Visit: Payer: Self-pay

## 2018-04-25 DIAGNOSIS — Z5321 Procedure and treatment not carried out due to patient leaving prior to being seen by health care provider: Secondary | ICD-10-CM | POA: Diagnosis not present

## 2018-04-25 DIAGNOSIS — R079 Chest pain, unspecified: Secondary | ICD-10-CM | POA: Insufficient documentation

## 2018-04-25 LAB — CBC
HCT: 40.4 % (ref 36.0–46.0)
Hemoglobin: 13 g/dL (ref 12.0–15.0)
MCH: 26.8 pg (ref 26.0–34.0)
MCHC: 32.2 g/dL (ref 30.0–36.0)
MCV: 83.3 fL (ref 80.0–100.0)
Platelets: 254 10*3/uL (ref 150–400)
RBC: 4.85 MIL/uL (ref 3.87–5.11)
RDW: 14.2 % (ref 11.5–15.5)
WBC: 11 10*3/uL — ABNORMAL HIGH (ref 4.0–10.5)
nRBC: 0 % (ref 0.0–0.2)

## 2018-04-25 LAB — BASIC METABOLIC PANEL
Anion gap: 6 (ref 5–15)
BUN: 12 mg/dL (ref 8–23)
CO2: 30 mmol/L (ref 22–32)
Calcium: 9.2 mg/dL (ref 8.9–10.3)
Chloride: 104 mmol/L (ref 98–111)
Creatinine, Ser: 0.69 mg/dL (ref 0.44–1.00)
GFR calc Af Amer: >60 mL/min (ref >60)
GFR calc non Af Amer: >60 mL/min (ref >60)
Glucose, Bld: 98 mg/dL (ref 70–99)
Potassium: 3.6 mmol/L (ref 3.5–5.1)
Sodium: 140 mmol/L (ref 135–145)

## 2018-04-25 LAB — TROPONIN I: Troponin I: <0.03 ng/mL (ref <0.03)

## 2018-04-25 NOTE — ED Triage Notes (Addendum)
Pt presents to ED with intermittent left sided chest pain that radiates into her left shoulder, arm, and scapula for the past couple of days. Pt states she called her pcp yesterday and was told that if pain returns to come to ED. Pt reports dizziness and nausea. No increased work of breathing or distress noted. Pt reports pain has decreased significantly and is only "slightly" in her chest at this time.

## 2018-04-26 ENCOUNTER — Telehealth: Payer: Self-pay | Admitting: Emergency Medicine

## 2018-04-26 NOTE — Telephone Encounter (Signed)
Called patient due to lwot to inquire about condition and follow up plans. No answer and mailbox is full. 

## 2018-04-28 ENCOUNTER — Other Ambulatory Visit: Payer: Self-pay | Admitting: Family Medicine

## 2018-05-06 ENCOUNTER — Other Ambulatory Visit: Payer: Self-pay | Admitting: Family Medicine

## 2018-06-03 ENCOUNTER — Telehealth: Payer: Self-pay | Admitting: Family Medicine

## 2018-06-03 MED ORDER — TRETINOIN 0.05 % EX CREA: TOPICAL_CREAM | Freq: Every day | CUTANEOUS | 0 refills | Status: DC

## 2018-06-03 NOTE — Telephone Encounter (Signed)
Copied from [place] [phone]. Topic: General - Other >> Jun 03, 2018 10:26 AM Lennox Solders wrote: Reason for CRM: pt is calling and would like to increase strength of tretinoin to 0.05 % cream instead of 0.025 % cream that strength does not work. Pt did not pick up 0.025 % . [facility] [place] [address]

## 2018-06-03 NOTE — Telephone Encounter (Signed)
ok 

## 2018-06-21 ENCOUNTER — Ambulatory Visit: Payer: Medicare HMO | Admitting: Neurology

## 2018-06-24 ENCOUNTER — Encounter: Payer: Self-pay | Admitting: Neurology

## 2018-07-01 ENCOUNTER — Other Ambulatory Visit: Payer: Self-pay | Admitting: Family Medicine

## 2018-07-02 ENCOUNTER — Other Ambulatory Visit: Payer: Self-pay | Admitting: Family Medicine

## 2018-07-02 DIAGNOSIS — R7303 Prediabetes: Secondary | ICD-10-CM

## 2018-07-02 MED ORDER — METFORMIN HCL 500 MG PO TABS: ORAL_TABLET | ORAL | 2 refills | Status: DC

## 2018-07-02 NOTE — Telephone Encounter (Signed)
Copied from [place] [id number]. Topic: Quick Communication - Rx Refill/Question >> Jul 02, 2018  3:41 PM Sheppard Coil, [employer] L wrote: Medication: metFORMIN (GLUCOPHAGE) 500 MG tablet  Has the patient contacted their pharmacy? Yes - states they haven't heard from office in response.  Pt states with virus going around she would like 90 day supply. (Agent: If no, request that the patient contact the pharmacy for the refill.) (Agent: If yes, when and what did the pharmacy advise?)  Preferred Pharmacy (with phone number or street name): [id number] [place], [place] [phone] (Phone) [phone] (Fax)  Agent: Please be advised that RX refills may take up to 3 business days. We ask that you follow-up with your pharmacy.

## 2018-08-30 ENCOUNTER — Other Ambulatory Visit: Payer: Self-pay | Admitting: Family Medicine

## 2018-08-30 DIAGNOSIS — E038 Other specified hypothyroidism: Secondary | ICD-10-CM

## 2018-08-30 DIAGNOSIS — R7303 Prediabetes: Secondary | ICD-10-CM

## 2018-08-30 DIAGNOSIS — I10 Essential (primary) hypertension: Secondary | ICD-10-CM

## 2018-08-30 MED ORDER — LOSARTAN POTASSIUM-HCTZ 50-12.5 MG PO TABS: 1.0000 | ORAL_TABLET | Freq: Two times a day (BID) | ORAL | 1 refills | Status: DC

## 2018-08-30 MED ORDER — SYNTHROID 100 MCG PO TABS: 100.0000 ug | ORAL_TABLET | Freq: Every day | ORAL | 1 refills | Status: DC

## 2018-08-30 MED ORDER — METFORMIN HCL 500 MG PO TABS: 500.0000 mg | ORAL_TABLET | Freq: Every day | ORAL | 1 refills | Status: DC

## 2018-08-30 MED ORDER — EPINEPHRINE 0.3 MG/0.3ML IJ SOAJ: 0.3000 mg | Freq: Once | INTRAMUSCULAR | 3 refills | Status: DC | PRN

## 2018-08-30 NOTE — Telephone Encounter (Signed)
Rxs sent

## 2018-08-30 NOTE — Telephone Encounter (Signed)
Copied from [place] [id number]. Topic: Quick Communication - Rx Refill/Question >> Aug 30, 2018 12:33 PM Selinda Flavin B, [place] wrote: **Patient requesting 90 day supply of these medications**  Medication: EPINEPHrine 0.3 mg/0.3 mL IJ SOAJ injection SYNTHROID 100 MCG tablet  metFORMIN (GLUCOPHAGE) 500 MG tablet losartan-hydrochlorothiazide (HYZAAR) 50-12.5 MG tablet   Has the patient contacted their pharmacy? Yes, last week and to call office and check status (Agent: If no, request that the patient contact the pharmacy for the refill.) (Agent: If yes, when and what did the pharmacy advise?)  Preferred Pharmacy (with phone number or street name): [id number] [place], [place]: Please be advised that RX refills may take up to 3 business days. We ask that you follow-up with your pharmacy.

## 2018-09-03 ENCOUNTER — Other Ambulatory Visit: Payer: Self-pay

## 2018-09-03 MED ORDER — BLOOD GLUCOSE METER KIT: PACK | 0 refills | Status: DC

## 2018-09-16 ENCOUNTER — Other Ambulatory Visit: Payer: Self-pay | Admitting: Family Medicine

## 2018-09-16 NOTE — Telephone Encounter (Signed)
Copied from [place] [phone]. Topic: Quick Communication - Rx Refill/Question >> Sep 16, 2018 11:57 AM Virl Axe D wrote: Medication: blood glucose meter kit and supplies Accu-check aviva plus meter and lancets/ Pharmacy stated that there are no directions on rx. They need to know how often pt tests. Please advise.   Has the patient contacted their pharmacy? Yes.   (Agent: If no, request that the patient contact the pharmacy for the refill.) (Agent: If yes, when and what did the pharmacy advise?)  Preferred Pharmacy (with phone number or street name): [place], [place] [phone] (Phone) [phone] (Fax)    Agent: Please be advised that RX refills may take up to 3 business days. We ask that you follow-up with your pharmacy.

## 2018-09-17 ENCOUNTER — Telehealth: Payer: Self-pay

## 2018-09-17 MED ORDER — BLOOD GLUCOSE METER KIT: PACK | 0 refills | Status: DC

## 2018-09-17 NOTE — Telephone Encounter (Signed)
Resolved.   Copied from [place] [id number]. Topic: General - Other >> Sep 12, 2018 12:51 PM Rainey Pines A wrote: [facility] called and would like instructions on the medications that were sent to them for the test strips and lancets. Reinaldo Meeker would like a callback.

## 2018-09-17 NOTE — Telephone Encounter (Signed)
New rx with directions sent to pharmacy.

## 2018-10-04 ENCOUNTER — Other Ambulatory Visit: Payer: Self-pay | Admitting: Family Medicine

## 2018-10-04 DIAGNOSIS — I10 Essential (primary) hypertension: Secondary | ICD-10-CM

## 2018-10-11 ENCOUNTER — Other Ambulatory Visit: Payer: Self-pay

## 2018-10-11 ENCOUNTER — Encounter: Payer: Self-pay | Admitting: Family Medicine

## 2018-10-30 ENCOUNTER — Other Ambulatory Visit: Payer: Self-pay | Admitting: Family Medicine

## 2018-11-30 ENCOUNTER — Other Ambulatory Visit: Payer: Self-pay | Admitting: Family Medicine

## 2018-12-29 ENCOUNTER — Other Ambulatory Visit: Payer: Self-pay | Admitting: Family Medicine

## 2019-01-24 ENCOUNTER — Telehealth: Payer: Self-pay | Admitting: Family Medicine

## 2019-01-24 DIAGNOSIS — R7303 Prediabetes: Secondary | ICD-10-CM

## 2019-01-24 MED ORDER — METFORMIN HCL 500 MG PO TABS: 500.0000 mg | ORAL_TABLET | Freq: Two times a day (BID) | ORAL | 3 refills | Status: DC

## 2019-01-24 NOTE — Telephone Encounter (Signed)
Ok, will refill  Meds ordered this encounter  Medications  . metFORMIN (GLUCOPHAGE) 500 MG tablet    Sig: Take 1 tablet (500 mg total) by mouth 2 (two) times daily with a meal.    Dispense:  180 tablet    Refill:  3

## 2019-01-24 NOTE — Telephone Encounter (Addendum)
Pt states for some time now she has been taking  2 daily -  metFORMIN (GLUCOPHAGE) 500 MG tablet This Rx was for only one per day.  Now she has run out. Only has one pill left. Pt states she thought she told the dr at her last visit, but she has had to take BID consistently to keep her sugar at a good level.   [id number] [place], [place] [phone] (Phone) [phone] (Fax)

## 2019-02-22 ENCOUNTER — Other Ambulatory Visit: Payer: Self-pay | Admitting: Family Medicine

## 2019-03-19 ENCOUNTER — Other Ambulatory Visit: Payer: Self-pay | Admitting: Family Medicine

## 2019-03-28 ENCOUNTER — Other Ambulatory Visit: Payer: Self-pay

## 2019-03-28 ENCOUNTER — Telehealth: Payer: Self-pay

## 2019-03-28 MED ORDER — ACCU-CHEK SOFT TOUCH LANCETS MISC: 1 refills | Status: DC

## 2019-03-28 MED ORDER — ACCU-CHEK AVIVA PLUS VI STRP: ORAL_STRIP | 1 refills | Status: DC

## 2019-03-28 NOTE — Telephone Encounter (Signed)
Copied from [place] [id number]. Topic: Quick Communication - Rx Refill/Question >> Mar 28, 2019  8:15 AM Carolyn Stare wrote: Req refill  Accu-check aviva plus  test strips and lancets                  Preferred Pharmacy  [facility]   Agent: Please be advised that RX refills may take up to 3 business days. We ask that you follow-up with your pharmacy.

## 2019-03-28 NOTE — Telephone Encounter (Signed)
Test strips and lancets refilled to [facility].

## 2019-04-24 ENCOUNTER — Other Ambulatory Visit: Payer: Self-pay | Admitting: Family Medicine

## 2019-04-24 DIAGNOSIS — E038 Other specified hypothyroidism: Secondary | ICD-10-CM

## 2019-04-24 DIAGNOSIS — I10 Essential (primary) hypertension: Secondary | ICD-10-CM

## 2019-04-24 NOTE — Telephone Encounter (Signed)
Pt called to check status of refill, please advise. She is requesting a 90 day supply

## 2019-04-25 ENCOUNTER — Other Ambulatory Visit: Payer: Self-pay | Admitting: *Deleted

## 2019-05-20 ENCOUNTER — Telehealth: Payer: Self-pay | Admitting: Family Medicine

## 2019-05-20 DIAGNOSIS — E038 Other specified hypothyroidism: Secondary | ICD-10-CM

## 2019-05-20 MED ORDER — ACCU-CHEK SOFT TOUCH LANCETS MISC: 1 refills | Status: DC

## 2019-05-20 MED ORDER — ACCU-CHEK AVIVA PLUS VI STRP: ORAL_STRIP | 1 refills | Status: DC

## 2019-05-20 MED ORDER — SYNTHROID 100 MCG PO TABS: ORAL_TABLET | ORAL | 0 refills | Status: DC

## 2019-05-20 NOTE — Telephone Encounter (Signed)
Medication:glucose blood (ACCU-CHEK AVIVA PLUS) test strip  Lancets (ACCU-CHEK SOFT TOUCH) lancets     Has the patient contacted their pharmacy? Yes.   (If no, request that the patient contact the pharmacy for the refill.) (If yes, when and what did the pharmacy advise?)  Preferred Pharmacy (with phone number or street name): [place], [place]  [place], [place] [address]  Phone:  [phone] Fax:  [phone]    Agent: Please be advised that RX refills may take up to 3 business days. We ask that you follow-up with your pharmacy.

## 2019-05-20 NOTE — Telephone Encounter (Signed)
Medication refilled patient needs appt for further refills.

## 2019-05-20 NOTE — Telephone Encounter (Signed)
Medication: SYNTHROID 100 MCG tablet   PT is requesting for  90 day supply..she don't have transportation to go to pharmacy every month   Has the patient contacted their pharmacy? No. (If no, request that the patient contact the pharmacy for the refill.) (If yes, when and what did the pharmacy advise?)  Preferred Pharmacy (with phone number or street name):[id number] [place], [place]  [address] [name] [address]  Phone:  [phone] Fax:  [phone]   Agent: Please be advised that RX refills may take up to 3 business days. We ask that you follow-up with your pharmacy.

## 2019-05-21 ENCOUNTER — Other Ambulatory Visit: Payer: Self-pay | Admitting: Family Medicine

## 2019-05-21 DIAGNOSIS — I10 Essential (primary) hypertension: Secondary | ICD-10-CM

## 2019-05-23 ENCOUNTER — Telehealth: Payer: Self-pay | Admitting: Family Medicine

## 2019-05-23 ENCOUNTER — Other Ambulatory Visit: Payer: Self-pay | Admitting: Family Medicine

## 2019-05-23 DIAGNOSIS — I10 Essential (primary) hypertension: Secondary | ICD-10-CM

## 2019-05-23 NOTE — Telephone Encounter (Signed)
Pharmacy states alcohol swabs prescription  sent in .

## 2019-05-26 DIAGNOSIS — R7303 Prediabetes: Secondary | ICD-10-CM | POA: Insufficient documentation

## 2019-05-26 NOTE — Progress Notes (Signed)
[place] at [facility] [address] [place], [place] [address] [phone]  Date:  05/28/2019   Name:  Robyn Montes   DOB:  [birth year]   MRN:  [id number]  PCP:  Darreld Mclean, MD    Chief Complaint: No chief complaint on file.   History of Present Illness:  Robyn Montes is a 64 y.o. very pleasant female patient who presents with the following:  Virtual visit today for periodic follow-up Patient location is home, provider location is office.  Patient identity confirmed with 2 factors, she gives consent for virtual visit today Conducted with patient via video visit, the patient and myself are Patient with history of hypertension, hypothyroidism, memory loss, iron deficiency anemia, hepatitis C status post curative treatment, prediabetes Last seen by myself in December 2019 for physical She is due for routine labs now  [facility] Colon cancer screening- she has on the call today Cologuard kit and will do asap  Mammogram December 2019- she is doing self- exams.  Plans to do her mammogram by the end of the year, has delayed some due to COVID-19 Pap is up-to-date  She has not been sick at all  Overall she is feeling well, does need medication refills today   Patient Active Problem List   Diagnosis Date Noted  . Prediabetes 05/26/2019  . Hepatitis B immune 04/04/2016  . Hypothyroidism 10/01/2014  . Memory loss 08/21/2014  . Chronic hepatitis C ([place]) 08/04/2014  . History of other specified conditions presenting hazards to health 08/04/2014  . Palpitations 10/03/2013  . Essential hypertension 10/03/2013  . Biliary calculi 01/17/2013    Past Medical History:  Diagnosis Date  . Anemia 1976, 1984, 2001, 2003   history of multiple transfusions (3); iron deficiency  . Arthritis    hands, knees B; DDD lumbar  . Blood transfusion without reported diagnosis   . Family history of  adverse reaction to anesthesia    pt's mother and daughter both had significant heart rate drops with anesthesia, believes her mother may have had a mini stroke while under anesthesia  . Family history of heart disease   . Heart murmur   . Hepatitis C 04/17/2013   acquired from blood transfusion; s/p [facility] treatment [facility] in [place].  Marland Kitchen HTN (hypertension)   . Hypothyroid   . Memory loss 08/21/2014  . Palpitations   . Pre-diabetes    on metformin for prediabetes  . Shortness of breath   . Sleep apnea    tested over 20 year ago, does not use cpap per patient    Past Surgical History:  Procedure Laterality Date  . LIVER BIOPSY      Social History   Tobacco Use  . Smoking status: Never Smoker  . Smokeless tobacco: Never Used  Substance Use Topics  . Alcohol use: No  . Drug use: No    Family History  Problem Relation Age of Onset  . Heart disease Mother        heart attack and stroke; passed away in her 81s  . Diabetes Mother   . Hyperlipidemia Mother   . Hypertension Mother   . Stroke Mother   . Heart disease Father        passed away in his 61s  . Hyperlipidemia Father   . Diabetes Brother   . Heart disease Brother   . Hypertension Brother   .  Diabetes Brother   . Hypertension Sister     Allergies  Allergen Reactions  . Bee Venom Other (See Comments)    Unknown    Medication list has been reviewed and updated.  Current Outpatient Medications on File Prior to Visit  Medication Sig Dispense Refill  . blood glucose meter kit and supplies Check glucose three times daily. Accu-check aviva plus meter. (FOR ICD-10 E10.9, E11.9). #300 lancets #300 test strips 1 each 0  . Brimonidine Tartrate (LUMIFY) 0.025 % SOLN Place 1-2 drops into both eyes daily as needed (for dry eyes).    . clindamycin-benzoyl peroxide (BENZACLIN) gel Apply topically 2 (two) times daily. 25 g 6  . donepezil (ARICEPT) 10 MG tablet Take 1 tablet (10 mg total) by mouth at bedtime. May  increase to 2 pills after 4 weeks 30 tablet 5  . EPINEPHrine 0.3 mg/0.3 mL IJ SOAJ injection Inject 0.3 mLs (0.3 mg total) into the skin once as needed for up to 1 dose for anaphylaxis. 2 Device 3  . Fluocinolone Acetonide Scalp 0.01 % OIL Apply to scalp and leave on 4 hours or overnight, repeat twice a week as needed 118 mL 3  . furosemide (LASIX) 20 MG tablet Take 1 tablet (20 mg total) by mouth daily. 30 tablet 1  . glucose blood (ACCU-CHEK AVIVA PLUS) test strip Use as instructed 100 each 1  . hydrochlorothiazide (HYDRODIURIL) 12.5 MG tablet TAKE 1 TABLET BY MOUTH TWICE DAILY. TAKE WITH LOSARTAN 180 tablet 3  . Lancets (ACCU-CHEK SOFT TOUCH) lancets Use as instructed 100 each 1  . lovastatin (MEVACOR) 20 MG tablet Take 1 tablet (20 mg total) at bedtime by mouth. 90 tablet 3  . Naphazoline HCl (CLEAR EYES OP) Place 2 drops into both eyes daily as needed (for dry eyes).    . potassium chloride (K-DUR) 10 MEQ tablet Take 1 tablet (10 mEq total) by mouth daily. 30 tablet 1  . sertraline (ZOLOFT) 50 MG tablet Take 1 tablet (50 mg total) by mouth daily. 90 tablet 3  . Vitamin D, Ergocalciferol, (DRISDOL) 50000 units CAPS capsule TAKE ONE CAPSULE BY MOUTH ONCE A WEEK 12 capsule 0   No current facility-administered medications on file prior to visit.    Review of Systems:  As per HPI- otherwise negative.   Physical Examination: There were no vitals filed for this visit. There were no vitals filed for this visit. There is no height or weight on file to calculate BMI. Ideal Body Weight:    Patient is order video, she looks well.  No cough, wheezing, distress or shortness of breath is noted Her home BP has been ok- running 130/85 or so  Assessment and Plan: Other specified hypothyroidism - Plan: TSH, SYNTHROID 100 MCG tablet  Prediabetes - Plan: Hemoglobin A1c  Screening for hyperlipidemia - Plan: Lipid panel  Essential hypertension - Plan: CBC, Comprehensive metabolic  panel  Essential hypertension, benign - Plan: losartan-hydrochlorothiazide (HYZAAR) 50-12.5 MG tablet, metoprolol succinate (TOPROL-XL) 50 MG 24 hr tablet  Borderline diabetes - Plan: metFORMIN (GLUCOPHAGE) 500 MG tablet  Photoaging of skin - Plan: tretinoin (RETIN-A) 0.05 % cream, hydroquinone 4 % cream  Colon cancer screening  Breast cancer screening by mammogram  Ordered refills as patient requested above, order labs and last my nurse to call her for a lab visit only Per patient blood pressure is under okay control She has Cologuard kit and plans to complete ASAP Encourage mammogram  Moderate medical decision making today  Signed Lamar Blinks, MD

## 2019-05-28 ENCOUNTER — Ambulatory Visit (INDEPENDENT_AMBULATORY_CARE_PROVIDER_SITE_OTHER): Payer: Medicare HMO | Admitting: Family Medicine

## 2019-05-28 ENCOUNTER — Encounter: Payer: Self-pay | Admitting: Family Medicine

## 2019-05-28 ENCOUNTER — Other Ambulatory Visit: Payer: Self-pay

## 2019-05-28 DIAGNOSIS — Z13220 Encounter for screening for lipoid disorders: Secondary | ICD-10-CM | POA: Diagnosis not present

## 2019-05-28 DIAGNOSIS — E038 Other specified hypothyroidism: Secondary | ICD-10-CM | POA: Diagnosis not present

## 2019-05-28 DIAGNOSIS — I10 Essential (primary) hypertension: Secondary | ICD-10-CM | POA: Diagnosis not present

## 2019-05-28 DIAGNOSIS — R7303 Prediabetes: Secondary | ICD-10-CM | POA: Diagnosis not present

## 2019-05-28 DIAGNOSIS — L578 Other skin changes due to chronic exposure to nonionizing radiation: Secondary | ICD-10-CM

## 2019-05-28 DIAGNOSIS — Z1231 Encounter for screening mammogram for malignant neoplasm of breast: Secondary | ICD-10-CM

## 2019-05-28 DIAGNOSIS — Z1211 Encounter for screening for malignant neoplasm of colon: Secondary | ICD-10-CM

## 2019-05-28 MED ORDER — LOSARTAN POTASSIUM-HCTZ 50-12.5 MG PO TABS: 1.0000 | ORAL_TABLET | Freq: Two times a day (BID) | ORAL | 3 refills | Status: DC

## 2019-05-28 MED ORDER — TRETINOIN 0.05 % EX CREA: TOPICAL_CREAM | CUTANEOUS | 1 refills | Status: DC

## 2019-05-28 MED ORDER — SYNTHROID 100 MCG PO TABS: ORAL_TABLET | ORAL | 3 refills | Status: DC

## 2019-05-28 MED ORDER — HYDROQUINONE 4 % EX CREA: 1.0000 "application " | TOPICAL_CREAM | Freq: Every day | CUTANEOUS | 1 refills | Status: DC

## 2019-05-28 MED ORDER — METOPROLOL SUCCINATE ER 50 MG PO TB24: ORAL_TABLET | ORAL | 3 refills | Status: DC

## 2019-05-28 MED ORDER — METFORMIN HCL 500 MG PO TABS: 500.0000 mg | ORAL_TABLET | Freq: Two times a day (BID) | ORAL | 3 refills | Status: DC

## 2019-05-30 ENCOUNTER — Telehealth: Payer: Self-pay

## 2019-05-30 NOTE — Telephone Encounter (Signed)
Tried calling patient to schedule lab appointment per PCP. No answer voicemail box full unable to leave message.

## 2019-06-13 ENCOUNTER — Other Ambulatory Visit: Payer: Self-pay | Admitting: Family Medicine

## 2019-06-13 DIAGNOSIS — L578 Other skin changes due to chronic exposure to nonionizing radiation: Secondary | ICD-10-CM

## 2019-06-18 ENCOUNTER — Other Ambulatory Visit: Payer: Self-pay | Admitting: Family Medicine

## 2019-06-18 DIAGNOSIS — E038 Other specified hypothyroidism: Secondary | ICD-10-CM

## 2019-06-27 IMAGING — DX DG CHEST 2V
2 series · 2 of 2 positions shown · non-contrast
Comparison: 12/08/2013

CLINICAL DATA: Bilateral leg swelling

EXAM:
CHEST  2 VIEW

[chest pa]
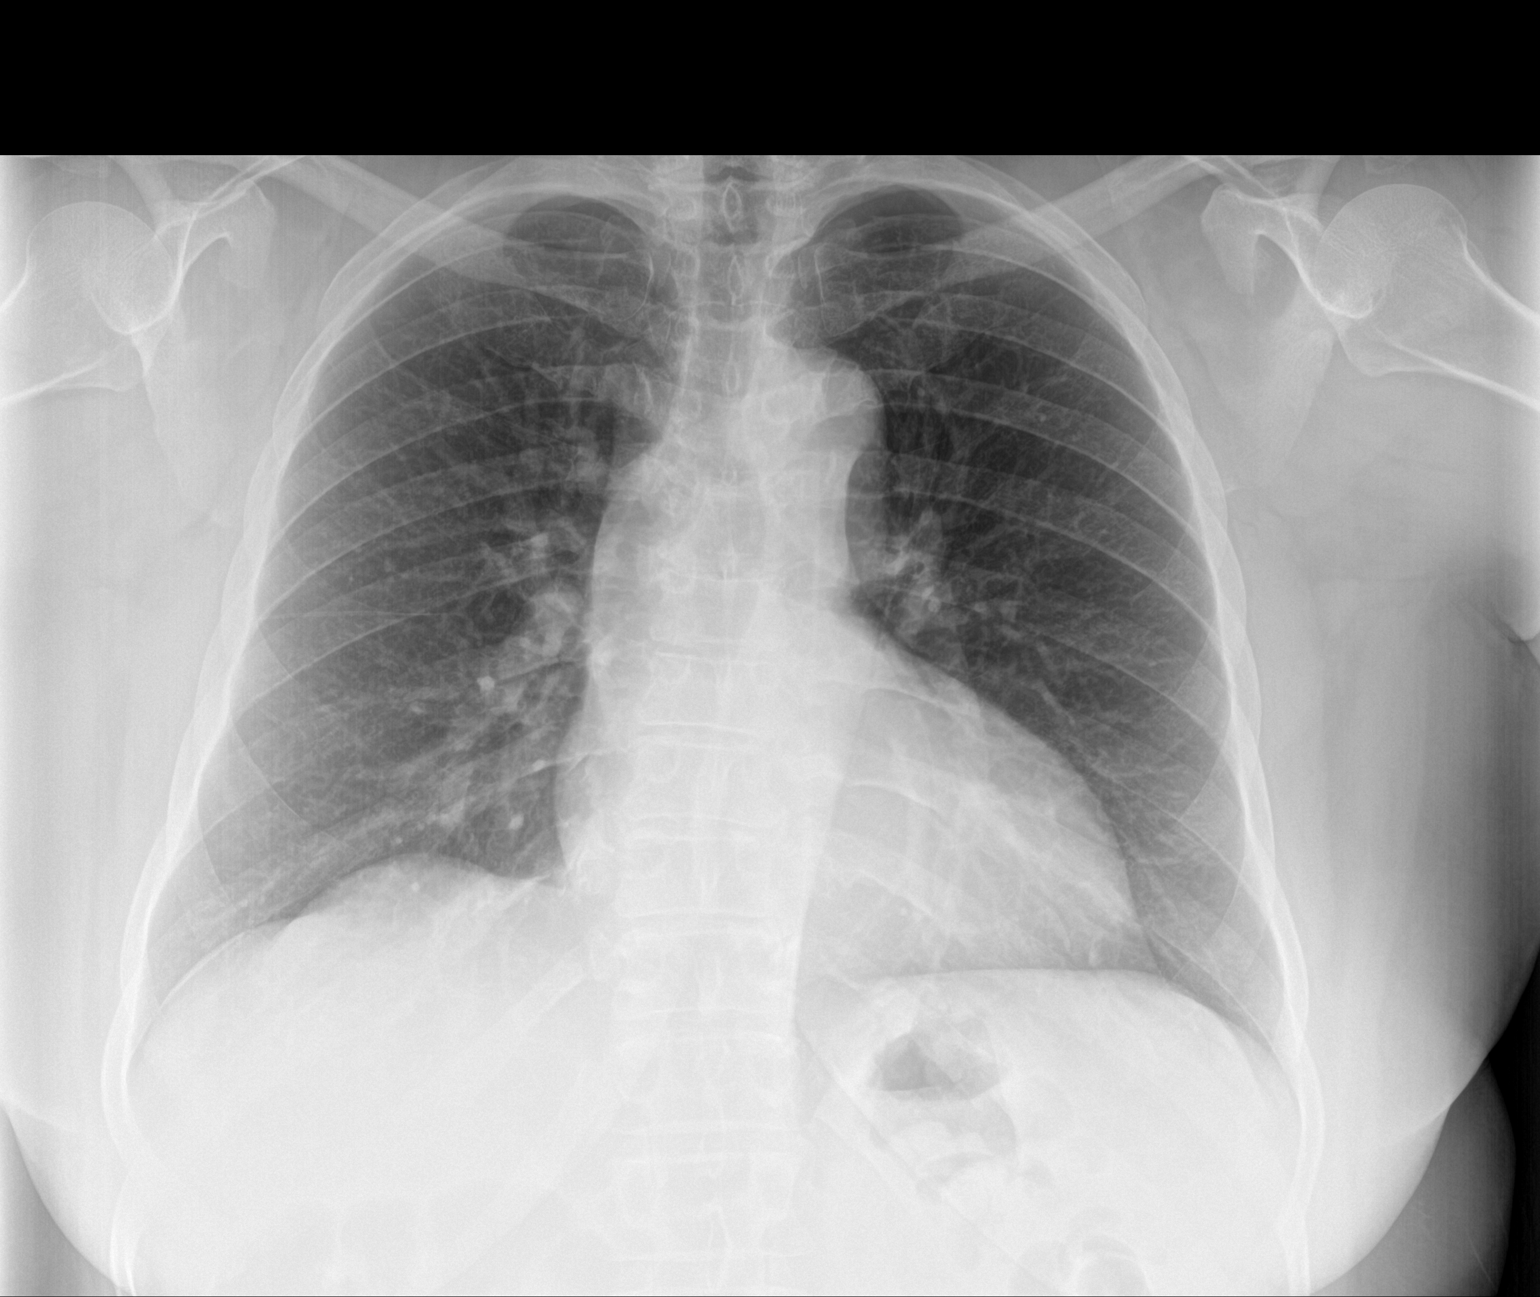

[chest lat]
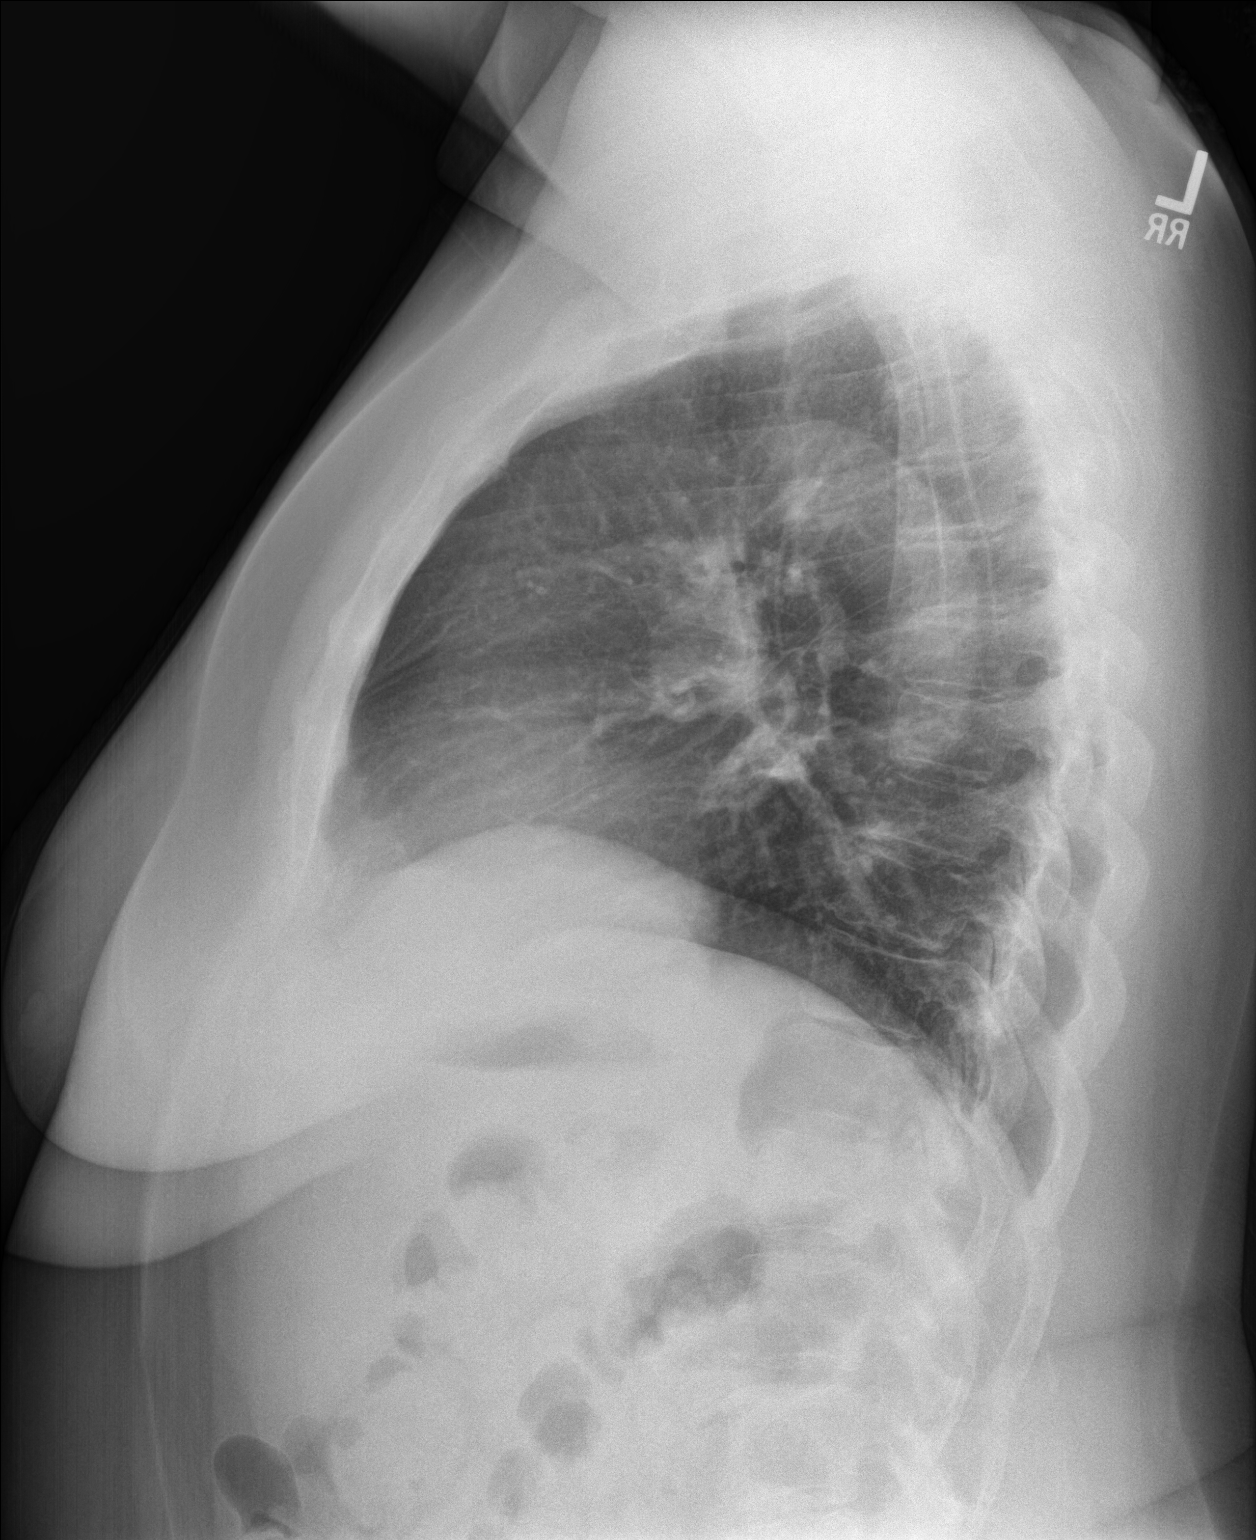

[2 of 2 positions shown; findings below may reference images not displayed]

FINDINGS: The heart size and mediastinal contours are within normal limits.
Both lungs are clear. The visualized skeletal structures are
unremarkable.
IMPRESSION: No active cardiopulmonary disease.

## 2019-07-08 ENCOUNTER — Other Ambulatory Visit (HOSPITAL_BASED_OUTPATIENT_CLINIC_OR_DEPARTMENT_OTHER): Payer: Self-pay | Admitting: Family Medicine

## 2019-07-08 ENCOUNTER — Other Ambulatory Visit: Payer: Self-pay

## 2019-07-08 DIAGNOSIS — Z1231 Encounter for screening mammogram for malignant neoplasm of breast: Secondary | ICD-10-CM

## 2019-07-09 ENCOUNTER — Telehealth: Payer: Self-pay | Admitting: Family Medicine

## 2019-07-09 DIAGNOSIS — M1990 Unspecified osteoarthritis, unspecified site: Secondary | ICD-10-CM

## 2019-07-09 NOTE — Telephone Encounter (Signed)
Ok added.

## 2019-07-09 NOTE — Telephone Encounter (Signed)
Patient is coming for labs tomorrow and would like CRP added to lab order pls.

## 2019-07-10 ENCOUNTER — Ambulatory Visit (HOSPITAL_BASED_OUTPATIENT_CLINIC_OR_DEPARTMENT_OTHER)
Admission: RE | Admit: 2019-07-10 | Discharge: 2019-07-10 | Disposition: A | Discharge status: Home / Self Care | Payer: Medicare HMO | Source: Ambulatory Visit | Attending: Family Medicine | Admitting: Family Medicine

## 2019-07-10 ENCOUNTER — Encounter: Payer: Self-pay | Admitting: Family Medicine

## 2019-07-10 ENCOUNTER — Other Ambulatory Visit: Payer: Self-pay

## 2019-07-10 ENCOUNTER — Other Ambulatory Visit (INDEPENDENT_AMBULATORY_CARE_PROVIDER_SITE_OTHER): Payer: Medicare HMO

## 2019-07-10 DIAGNOSIS — Z13220 Encounter for screening for lipoid disorders: Secondary | ICD-10-CM

## 2019-07-10 DIAGNOSIS — Z1231 Encounter for screening mammogram for malignant neoplasm of breast: Secondary | ICD-10-CM

## 2019-07-10 DIAGNOSIS — I10 Essential (primary) hypertension: Secondary | ICD-10-CM

## 2019-07-10 DIAGNOSIS — M1990 Unspecified osteoarthritis, unspecified site: Secondary | ICD-10-CM | POA: Diagnosis not present

## 2019-07-10 DIAGNOSIS — R7303 Prediabetes: Secondary | ICD-10-CM

## 2019-07-10 DIAGNOSIS — E038 Other specified hypothyroidism: Secondary | ICD-10-CM | POA: Diagnosis not present

## 2019-07-10 LAB — CBC
HCT: 39.3 % (ref 36.0–46.0)
Hemoglobin: 12.9 g/dL (ref 12.0–15.0)
MCHC: 32.8 g/dL (ref 30.0–36.0)
MCV: 81.1 fl (ref 78.0–100.0)
Platelets: 251 10*3/uL (ref 150.0–400.0)
RBC: 4.84 Mil/uL (ref 3.87–5.11)
RDW: 15.5 % (ref 11.5–15.5)
WBC: 7.8 10*3/uL (ref 4.0–10.5)

## 2019-07-10 LAB — COMPREHENSIVE METABOLIC PANEL
ALT: 12 U/L (ref 0–35)
AST: 17 U/L (ref 0–37)
Albumin: 4.5 g/dL (ref 3.5–5.2)
Alkaline Phosphatase: 61 U/L (ref 39–117)
BUN: 13 mg/dL (ref 6–23)
CO2: 31 mEq/L (ref 19–32)
Calcium: 10 mg/dL (ref 8.4–10.5)
Chloride: 102 mEq/L (ref 96–112)
Creatinine, Ser: 0.82 mg/dL (ref 0.40–1.20)
GFR: 85.39 mL/min (ref >60.00)
Glucose, Bld: 87 mg/dL (ref 70–99)
Potassium: 4.5 mEq/L (ref 3.5–5.1)
Sodium: 139 mEq/L (ref 135–145)
Total Bilirubin: 0.4 mg/dL (ref 0.2–1.2)
Total Protein: 7.8 g/dL (ref 6.0–8.3)

## 2019-07-10 LAB — LIPID PANEL
Cholesterol: 177 mg/dL (ref 0–200)
HDL: 34.9 mg/dL — ABNORMAL LOW (ref >39.00)
LDL Cholesterol: 124 mg/dL — ABNORMAL HIGH (ref 0–99)
NonHDL: 142.39
Total CHOL/HDL Ratio: 5
Triglycerides: 93 mg/dL (ref 0.0–149.0)
VLDL: 18.6 mg/dL (ref 0.0–40.0)

## 2019-07-10 LAB — TSH: TSH: 2.03 u[IU]/mL (ref 0.35–4.50)

## 2019-07-10 LAB — HEMOGLOBIN A1C: Hgb A1c MFr Bld: 5.9 % (ref 4.6–6.5)

## 2019-07-10 LAB — C-REACTIVE PROTEIN: CRP: <1 mg/dL (ref 0.5–20.0)

## 2019-07-14 ENCOUNTER — Other Ambulatory Visit: Payer: Self-pay | Admitting: Family Medicine

## 2019-07-14 DIAGNOSIS — R928 Other abnormal and inconclusive findings on diagnostic imaging of breast: Secondary | ICD-10-CM

## 2019-07-16 ENCOUNTER — Telehealth: Payer: Self-pay | Admitting: Family Medicine

## 2019-07-16 ENCOUNTER — Encounter: Payer: Self-pay | Admitting: Family Medicine

## 2019-07-16 NOTE — Telephone Encounter (Signed)
Patient advised she needs an appointment however she states you have seen her for this in the past. Please advise if [place] is appropriate

## 2019-07-16 NOTE — Telephone Encounter (Signed)
Caller : Robyn Montes Call Back # [phone]   Patient is requesting a Ultra Sound of her back, patient states that she has a back pain, (knot ) on her that's getting bigger. Pt has mention to provider before.  Please advise

## 2019-07-17 ENCOUNTER — Other Ambulatory Visit: Payer: Self-pay | Admitting: Family Medicine

## 2019-07-17 ENCOUNTER — Ambulatory Visit
Admission: RE | Admit: 2019-07-17 | Discharge: 2019-07-17 | Disposition: A | Discharge status: Home / Self Care | Payer: Medicare HMO | Source: Ambulatory Visit | Attending: Family Medicine | Admitting: Family Medicine

## 2019-07-17 ENCOUNTER — Other Ambulatory Visit: Payer: Self-pay

## 2019-07-17 DIAGNOSIS — R599 Enlarged lymph nodes, unspecified: Secondary | ICD-10-CM

## 2019-07-17 DIAGNOSIS — R928 Other abnormal and inconclusive findings on diagnostic imaging of breast: Secondary | ICD-10-CM

## 2019-07-21 ENCOUNTER — Telehealth: Payer: Self-pay | Admitting: *Deleted

## 2019-07-21 MED ORDER — TRUEPLUS LANCETS 33G MISC: 1 refills | Status: DC

## 2019-07-21 MED ORDER — TRUE METRIX AIR GLUCOSE METER DEVI: 1.0000 | Freq: Three times a day (TID) | 0 refills | Status: DC

## 2019-07-21 MED ORDER — TRUE METRIX BLOOD GLUCOSE TEST VI STRP: ORAL_STRIP | 1 refills | Status: DC

## 2019-07-21 NOTE — Telephone Encounter (Signed)
Spoke with [facility] and patient requested a new meter.  They need a rx for true metrix air meter, true metrix strips, and true plus strips sent in.    Rx sent in.

## 2019-07-21 NOTE — Telephone Encounter (Signed)
Initial Comment Caller from [facility] requesting return call about medication. Patient name is Robyn Montes DOB [birth year]. Return phone is [phone] Additional Comment Disp. Time Disposition Final User 07/18/2019 8:13:59 AM General Information Provided Yes Jaclyn Prime

## 2019-07-22 ENCOUNTER — Telehealth: Payer: Self-pay

## 2019-07-22 DIAGNOSIS — R222 Localized swelling, mass and lump, trunk: Secondary | ICD-10-CM

## 2019-07-22 NOTE — Telephone Encounter (Signed)
Patient called in needing Dr. Lorelei Pont to place an order for an Ultra Sound for her lower back. Please advise the patient when this is done at [phone]

## 2019-07-23 NOTE — Telephone Encounter (Signed)
Sent message previously about placing ultra sound order. I don't see where an ultra sound of her "back" was placed however I do see left axilla placed on 4/1 and scheduled for July is this different?

## 2019-07-24 ENCOUNTER — Other Ambulatory Visit: Payer: Medicare HMO

## 2019-07-25 ENCOUNTER — Other Ambulatory Visit: Payer: Self-pay | Admitting: Family Medicine

## 2019-07-25 DIAGNOSIS — L578 Other skin changes due to chronic exposure to nonionizing radiation: Secondary | ICD-10-CM

## 2019-07-28 ENCOUNTER — Ambulatory Visit (HOSPITAL_BASED_OUTPATIENT_CLINIC_OR_DEPARTMENT_OTHER)
Admission: RE | Admit: 2019-07-28 | Discharge: 2019-07-28 | Disposition: A | Discharge status: Home / Self Care | Payer: Medicare HMO | Source: Ambulatory Visit | Attending: Family Medicine | Admitting: Family Medicine

## 2019-07-28 ENCOUNTER — Encounter (HOSPITAL_BASED_OUTPATIENT_CLINIC_OR_DEPARTMENT_OTHER): Payer: Self-pay

## 2019-07-28 ENCOUNTER — Other Ambulatory Visit: Payer: Self-pay

## 2019-07-28 DIAGNOSIS — R222 Localized swelling, mass and lump, trunk: Secondary | ICD-10-CM

## 2019-07-29 ENCOUNTER — Ambulatory Visit (HOSPITAL_BASED_OUTPATIENT_CLINIC_OR_DEPARTMENT_OTHER): Payer: Medicare HMO

## 2019-07-31 ENCOUNTER — Ambulatory Visit
Admission: RE | Admit: 2019-07-31 | Discharge: 2019-07-31 | Disposition: A | Discharge status: Home / Self Care | Payer: Medicare HMO | Source: Ambulatory Visit | Attending: Family Medicine | Admitting: Family Medicine

## 2019-07-31 DIAGNOSIS — R222 Localized swelling, mass and lump, trunk: Secondary | ICD-10-CM

## 2019-08-01 ENCOUNTER — Other Ambulatory Visit: Payer: Self-pay | Admitting: Family Medicine

## 2019-08-01 ENCOUNTER — Encounter: Payer: Self-pay | Admitting: Family Medicine

## 2019-08-06 ENCOUNTER — Telehealth: Payer: Self-pay | Admitting: Family Medicine

## 2019-08-06 DIAGNOSIS — R222 Localized swelling, mass and lump, trunk: Secondary | ICD-10-CM

## 2019-08-06 NOTE — Telephone Encounter (Signed)
Patient did not see mychart message sent to her. Read her results. She did agree that she should have an MRI. She states she would like this placed at [facility].

## 2019-08-06 NOTE — Telephone Encounter (Signed)
Ok order placed.  

## 2019-08-06 NOTE — Telephone Encounter (Signed)
Caller : Cherri  Call Back # [phone]  Patient is requesting results from ultra sound

## 2019-08-13 ENCOUNTER — Telehealth: Payer: Self-pay | Admitting: Family Medicine

## 2019-08-13 NOTE — Telephone Encounter (Signed)
Updated auth for MRI to with and w/o Pt is scheduled at [facility] for 5/24 Order notes STAT. Please advise.

## 2019-08-13 NOTE — Addendum Note (Signed)
Addended by: Wynonia Musty A on: 08/13/2019 04:38 PM   Modules accepted: Orders

## 2019-08-13 NOTE — Telephone Encounter (Signed)
Marked as stat in error.  I have fixed order. Does NOT need to be stat

## 2019-08-13 NOTE — Addendum Note (Signed)
Addended by: Wynonia Musty A on: 08/13/2019 03:03 PM   Modules accepted: Orders

## 2019-08-13 NOTE — Telephone Encounter (Signed)
Reordered mri and ordered as stat.

## 2019-08-13 NOTE — Telephone Encounter (Addendum)
Caller: [facility]  Call Back # [phone]  Subject: MRI Chest   Per [facility] needs for patient order to state MRI chest With and With out contrast.  In order to schedule   Please advise

## 2019-09-08 ENCOUNTER — Ambulatory Visit
Admission: RE | Admit: 2019-09-08 | Discharge: 2019-09-08 | Disposition: A | Discharge status: Home / Self Care | Payer: Medicare HMO | Source: Ambulatory Visit | Attending: Family Medicine | Admitting: Family Medicine

## 2019-09-08 ENCOUNTER — Other Ambulatory Visit: Payer: Self-pay | Admitting: Family Medicine

## 2019-09-08 ENCOUNTER — Other Ambulatory Visit: Payer: Self-pay

## 2019-09-08 DIAGNOSIS — R222 Localized swelling, mass and lump, trunk: Secondary | ICD-10-CM

## 2019-09-08 MED ORDER — GADOBENATE DIMEGLUMINE 529 MG/ML IV SOLN: 20.0000 mL | Freq: Once | INTRAVENOUS | Status: DC | PRN

## 2019-09-09 ENCOUNTER — Encounter: Payer: Self-pay | Admitting: Family Medicine

## 2019-09-19 ENCOUNTER — Other Ambulatory Visit: Payer: Self-pay | Admitting: Family Medicine

## 2019-09-19 DIAGNOSIS — L578 Other skin changes due to chronic exposure to nonionizing radiation: Secondary | ICD-10-CM

## 2019-10-06 ENCOUNTER — Other Ambulatory Visit: Payer: Self-pay

## 2019-10-06 DIAGNOSIS — L719 Rosacea, unspecified: Secondary | ICD-10-CM

## 2019-10-06 NOTE — Telephone Encounter (Signed)
Patient called in to get a prescription refill for clindamycin-benzoyl peroxide (BENZACLIN) gel [[id number]]    [id number] [place] [place]  [address] [name] [address]  Phone:  [phone] Fax:  [phone]  DEA #:  [id number]

## 2019-10-07 ENCOUNTER — Other Ambulatory Visit: Payer: Self-pay | Admitting: Family Medicine

## 2019-10-07 DIAGNOSIS — L719 Rosacea, unspecified: Secondary | ICD-10-CM

## 2019-10-07 MED ORDER — CLINDAMYCIN PHOS-BENZOYL PEROX 1-5 % EX GEL: Freq: Two times a day (BID) | CUTANEOUS | 6 refills | Status: DC

## 2019-10-07 NOTE — Telephone Encounter (Signed)
Rx sent 

## 2019-10-09 ENCOUNTER — Telehealth (INDEPENDENT_AMBULATORY_CARE_PROVIDER_SITE_OTHER): Payer: Medicare HMO | Admitting: Family Medicine

## 2019-10-09 ENCOUNTER — Other Ambulatory Visit: Payer: Self-pay

## 2019-10-09 ENCOUNTER — Encounter: Payer: Self-pay | Admitting: Family Medicine

## 2019-10-09 VITALS — BP 135/79 | HR 68 | Wt 210.6 lb

## 2019-10-09 DIAGNOSIS — E669 Obesity, unspecified: Secondary | ICD-10-CM

## 2019-10-09 MED ORDER — PHENTERMINE HCL 15 MG PO CAPS: 15.0000 mg | ORAL_CAPSULE | ORAL | 1 refills | Status: DC

## 2019-10-09 NOTE — Progress Notes (Signed)
[place] at [facility] [address], [place], [place] [address] [phone]  Date:  10/09/2019   Name:  Robyn Montes   DOB:  04/21/1955   MRN:  [id number]  PCP:  Darreld Mclean, MD    Chief Complaint: No chief complaint on file.   History of Present Illness:  Robyn Montes is a 64 y.o. very pleasant female patient who presents with the following:  Virtual visit today to discuss medication History of prediabetes, hypertension, hypothyroidism, hepatitis C status post curative treatment Patient location is home, provider location is office.  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  It is relatively large but it is benign  Today she is concerned about weight gain She is working on increased exercise and is eating better.  However she is frustrated that she does not seem to be losing weight despite her best efforts.  She has been working on weight loss for several months.  Her blood pressure however seems to be improving since she is more active Pt notes that her SBP is generally 125 or so  Colon cancer screening- she has a cologuard test that she plans to complete covid series complete   Robyn Montes is interested in trying to wait less medication, as she has not had much luck losing weight on her own We last checked her thyroid in March, it was within range  Wt Readings from Last 3 Encounters:  10/09/19 210 lb 9.6 oz (95.5 kg)  04/25/18 180 lb (81.6 kg)  04/15/18 209 lb (94.8 kg)   BP Readings from Last 3 Encounters:  10/09/19 135/79  04/25/18 122/90  04/15/18 122/84   Body mass index is 32.98 kg/m.   Lab Results  Component Value Date   TSH 2.03 07/10/2019    Patient Active Problem List   Diagnosis Date Noted  .  Prediabetes 05/26/2019  . Hepatitis B immune 04/04/2016  . Hypothyroidism 10/01/2014  . Memory loss 08/21/2014  . Chronic hepatitis C ([place]) 08/04/2014  . History of other specified conditions presenting hazards to health 08/04/2014  . Palpitations 10/03/2013  . Essential hypertension 10/03/2013  . Biliary calculi 01/17/2013    Past Medical History:  Diagnosis Date  . Anemia 1976, 1984, 2001, 2003   history of multiple transfusions (3); iron deficiency  . Arthritis    hands, knees B; DDD lumbar  . Blood transfusion without reported diagnosis   . Family history of adverse reaction to anesthesia    pt's mother and daughter both had significant heart rate drops with anesthesia, believes her mother may have had a mini stroke while under anesthesia  . Family history of heart disease   . Heart murmur   . Hepatitis C 04/17/2013   acquired from blood transfusion; s/p [facility] treatment [facility] in [place].  Marland Kitchen HTN (hypertension)   . Hypothyroid   . Memory loss 08/21/2014  . Palpitations   . Pre-diabetes    on metformin for prediabetes  . Shortness of breath   . Sleep apnea    tested over 20 year ago, does not use cpap per patient    Past Surgical History:  Procedure Laterality Date  . LIVER BIOPSY      Social History   Tobacco  Use  . Smoking status: Never Smoker  . Smokeless tobacco: Never Used  Vaping Use  . Vaping Use: Never used  Substance Use Topics  . Alcohol use: No  . Drug use: No    Family History  Problem Relation Age of Onset  . Heart disease Mother        heart attack and stroke; passed away in her 37s  . Diabetes Mother   . Hyperlipidemia Mother   . Hypertension Mother   . Stroke Mother   . Heart disease Father        passed away in his 82s  . Hyperlipidemia Father   . Diabetes Brother   . Heart disease Brother   . Hypertension Brother   . Diabetes Brother   . Hypertension Sister     Allergies  Allergen Reactions  . Bee Venom Other (See  Comments)    Unknown    Medication list has been reviewed and updated.  Current Outpatient Medications on File Prior to Visit  Medication Sig Dispense Refill  . Blood Glucose Monitoring Suppl (TRUE METRIX AIR GLUCOSE METER) DEVI 1 each by Does not apply route 3 (three) times daily. Dx code: E11.9 1 each 0  . Brimonidine Tartrate (LUMIFY) 0.025 % SOLN Place 1-2 drops into both eyes daily as needed (for dry eyes).    . clindamycin-benzoyl peroxide (BENZACLIN) gel Apply topically 2 (two) times daily. 25 g 6  . donepezil (ARICEPT) 10 MG tablet Take 1 tablet (10 mg total) by mouth at bedtime. May increase to 2 pills after 4 weeks 30 tablet 5  . EPINEPHrine 0.3 mg/0.3 mL IJ SOAJ injection Inject 0.3 mLs (0.3 mg total) into the skin once as needed for up to 1 dose for anaphylaxis. 2 Device 3  . glucose blood (TRUE METRIX BLOOD GLUCOSE TEST) test strip Check sugar 3 times a day.  Dx code: E11.9 300 each 1  . hydroquinone 4 % cream Apply 1 application topically daily. Use for 2-3 weeks only 28.35 g 1  . losartan-hydrochlorothiazide (HYZAAR) 50-12.5 MG tablet Take 1 tablet by mouth 2 (two) times daily. 180 tablet 3  . lovastatin (MEVACOR) 20 MG tablet Take 1 tablet (20 mg total) at bedtime by mouth. 90 tablet 3  . metFORMIN (GLUCOPHAGE) 500 MG tablet Take 1 tablet (500 mg total) by mouth 2 (two) times daily with a meal. 180 tablet 3  . metoprolol succinate (TOPROL-XL) 50 MG 24 hr tablet Take once daily with or immediately following a meal. 90 tablet 3  . Naphazoline HCl (CLEAR EYES OP) Place 2 drops into both eyes daily as needed (for dry eyes).    . SYNTHROID 100 MCG tablet TAKE 1 TABLET BY MOUTH EVERY DAY BEFORE BREAKFAST 30 tablet 3  . tretinoin (RETIN-A) 0.05 % cream APPLY TO AFFECTED AREA TOPICALLY AT BEDTIME AS NEEDED FOR ACNE 45 g 1  . TRUEplus Lancets 33G MISC Check sugar 3 times a day.  Dx code: E11.9 300 each 1  . Vitamin D, Ergocalciferol, (DRISDOL) 50000 units CAPS capsule TAKE ONE CAPSULE  BY MOUTH ONCE A WEEK 12 capsule 0  . potassium chloride (K-DUR) 10 MEQ tablet Take 1 tablet (10 mEq total) by mouth daily. (Patient not taking: Reported on 10/09/2019) 30 tablet 1   No current facility-administered medications on file prior to visit.    Review of Systems:  As per HPI- otherwise negative.   Physical Examination: Vitals:   10/09/19 1139  BP: 135/79  Pulse: 68  Vitals:   10/09/19 1139  Weight: 210 lb 9.6 oz (95.5 kg)   Body mass index is 32.98 kg/m. Ideal Body Weight:    Pt observed via video monitor She looks well, her normal self Assessment and Plan: Obesity (BMI 30.0-34.9) - Plan: phentermine 15 MG capsule  Virtual visit today to discuss concern of obesity.  Patient has been working on weight loss but is not having much success of her own.  She has made a good effort with diet change, reports she is eating more fruits and vegetables and less processed foods.  She is also trying to exercise by walking, riding a stationary bicycle and swimming. She does have hypertension but has been well controlled.  I will start her on phentermine 15 mg, made an appointment to recheck in 1 month for blood pressure eval and weight check Video used for the entirety of our visit today This visit occurred during the SARS-CoV-2 public health emergency.  Safety protocols were in place, including screening questions prior to the visit, additional usage of staff PPE, and extensive cleaning of exam room while observing appropriate contact time as indicated for disinfecting solutions.    Signed Lamar Blinks, MD

## 2019-10-17 ENCOUNTER — Ambulatory Visit
Admission: RE | Admit: 2019-10-17 | Discharge: 2019-10-17 | Disposition: A | Discharge status: Home / Self Care | Payer: Medicare HMO | Source: Ambulatory Visit | Attending: Family Medicine | Admitting: Family Medicine

## 2019-10-17 ENCOUNTER — Other Ambulatory Visit: Payer: Self-pay

## 2019-10-17 DIAGNOSIS — R599 Enlarged lymph nodes, unspecified: Secondary | ICD-10-CM

## 2019-10-31 ENCOUNTER — Telehealth: Payer: Self-pay | Admitting: Family Medicine

## 2019-10-31 NOTE — Telephone Encounter (Signed)
   Patient requesting Ear Drops sent into Pharmacy. Patient states that Dr.Copland is aware of her multiple ear infection/pain throughout the year.   [id number] [place], [place] Phone:  [phone]  Fax:  [phone]       Please Advise

## 2019-10-31 NOTE — Telephone Encounter (Signed)
Attempted to contact patient, no answer/VM.

## 2019-11-08 NOTE — Progress Notes (Deleted)
[place] at [facility] [address], [place], [place] [address] [phone]  Date:  11/10/2019   Name:  Robyn Montes   DOB:  Mar 06, 1956   MRN:  [id number]  PCP:  Darreld Mclean, MD    Chief Complaint: No chief complaint on file.   History of Present Illness:  Robyn Montes is a 64 y.o. very pleasant female patient who presents with the following:  Last seen by myself virtually in June - at that time I started her on phentermine for weight loss History of prediabetes, hypertension, hypothyroidism, hepatitis C status post curative treatment  Patient Active Problem List   Diagnosis Date Noted  . Prediabetes 05/26/2019  . Hepatitis B immune 04/04/2016  . Hypothyroidism 10/01/2014  . Memory loss 08/21/2014  . Chronic hepatitis C ([place]) 08/04/2014  . History of other specified conditions presenting hazards to health 08/04/2014  . Palpitations 10/03/2013  . Essential hypertension 10/03/2013  . Biliary calculi 01/17/2013    Past Medical History:  Diagnosis Date  . Anemia 1976, 1984, 2001, 2003   history of multiple transfusions (3); iron deficiency  . Arthritis    hands, knees B; DDD lumbar  . Blood transfusion without reported diagnosis   . Family history of adverse reaction to anesthesia    pt's mother and daughter both had significant heart rate drops with anesthesia, believes her mother may have had a mini stroke while under anesthesia  . Family history of heart disease   . Heart murmur   . Hepatitis C 04/17/2013   acquired from blood transfusion; s/p [facility] treatment [facility] in [place].  Marland Kitchen HTN (hypertension)   . Hypothyroid   . Memory loss 08/21/2014  . Palpitations   . Pre-diabetes    on metformin for prediabetes  . Shortness of breath   . Sleep apnea    tested over 20 year ago, does not use cpap per patient    Past Surgical History:  Procedure Laterality Date  . LIVER BIOPSY      Social  History   Tobacco Use  . Smoking status: Never Smoker  . Smokeless tobacco: Never Used  Vaping Use  . Vaping Use: Never used  Substance Use Topics  . Alcohol use: No  . Drug use: No    Family History  Problem Relation Age of Onset  . Heart disease Mother        heart attack and stroke; passed away in her 4s  . Diabetes Mother   . Hyperlipidemia Mother   . Hypertension Mother   . Stroke Mother   . Heart disease Father        passed away in his 34s  . Hyperlipidemia Father   . Diabetes Brother   . Heart disease Brother   . Hypertension Brother   . Diabetes Brother   . Hypertension Sister     Allergies  Allergen Reactions  . Bee Venom Other (See Comments)    Unknown    Medication list has been reviewed and updated.  Current Outpatient Medications on File Prior to Visit  Medication Sig Dispense Refill  . Blood Glucose Monitoring Suppl (TRUE METRIX AIR GLUCOSE METER) DEVI 1 each by Does not apply route 3 (three) times daily. Dx code: E11.9 1 each 0  . Brimonidine Tartrate (LUMIFY) 0.025 % SOLN Place 1-2 drops into both eyes daily as needed (for dry eyes).    . clindamycin-benzoyl peroxide (BENZACLIN) gel Apply  topically 2 (two) times daily. 25 g 6  . donepezil (ARICEPT) 10 MG tablet Take 1 tablet (10 mg total) by mouth at bedtime. May increase to 2 pills after 4 weeks 30 tablet 5  . EPINEPHrine 0.3 mg/0.3 mL IJ SOAJ injection Inject 0.3 mLs (0.3 mg total) into the skin once as needed for up to 1 dose for anaphylaxis. 2 Device 3  . glucose blood (TRUE METRIX BLOOD GLUCOSE TEST) test strip Check sugar 3 times a day.  Dx code: E11.9 300 each 1  . hydroquinone 4 % cream Apply 1 application topically daily. Use for 2-3 weeks only 28.35 g 1  . losartan-hydrochlorothiazide (HYZAAR) 50-12.5 MG tablet Take 1 tablet by mouth 2 (two) times daily. 180 tablet 3  . lovastatin (MEVACOR) 20 MG tablet Take 1 tablet (20 mg total) at bedtime by mouth. 90 tablet 3  . metFORMIN (GLUCOPHAGE)  500 MG tablet Take 1 tablet (500 mg total) by mouth 2 (two) times daily with a meal. 180 tablet 3  . metoprolol succinate (TOPROL-XL) 50 MG 24 hr tablet Take once daily with or immediately following a meal. 90 tablet 3  . Naphazoline HCl (CLEAR EYES OP) Place 2 drops into both eyes daily as needed (for dry eyes).    . phentermine 15 MG capsule Take 1 capsule (15 mg total) by mouth every morning. 30 capsule 1  . potassium chloride (K-DUR) 10 MEQ tablet Take 1 tablet (10 mEq total) by mouth daily. (Patient not taking: Reported on 10/09/2019) 30 tablet 1  . SYNTHROID 100 MCG tablet TAKE 1 TABLET BY MOUTH EVERY DAY BEFORE BREAKFAST 30 tablet 3  . tretinoin (RETIN-A) 0.05 % cream APPLY TO AFFECTED AREA TOPICALLY AT BEDTIME AS NEEDED FOR ACNE 45 g 1  . TRUEplus Lancets 33G MISC Check sugar 3 times a day.  Dx code: E11.9 300 each 1  . Vitamin D, Ergocalciferol, (DRISDOL) 50000 units CAPS capsule TAKE ONE CAPSULE BY MOUTH ONCE A WEEK 12 capsule 0   No current facility-administered medications on file prior to visit.    Review of Systems:  ***  Physical Examination: There were no vitals filed for this visit. There were no vitals filed for this visit. There is no height or weight on file to calculate BMI. Ideal Body Weight:    ***  Assessment and Plan: ***  Signed Lamar Blinks, MD

## 2019-11-10 ENCOUNTER — Ambulatory Visit: Payer: Medicare HMO | Admitting: Family Medicine

## 2019-11-16 NOTE — Progress Notes (Addendum)
[place] at [facility] [address] [place], [place] [address] [phone]  Date:  11/20/2019   Name:  Robyn Montes   DOB:  March 10, 1956   MRN:  [id number]  PCP:  Darreld Mclean, MD    Chief Complaint: Medication Refill   History of Present Illness:  Robyn Montes is a 64 y.o. very pleasant female patient who presents with the following:  Following up today Last seen by myself virtually in June of this year with concern of weight gain History of prediabetes, hypertension, hypothyroidism, hepatitis C status post curative treatment At our last visit I started her on phentermine 15 mg for weight loss, asked her to come in today to check BP and weight  However, patient states that when she went to pick up phentermine the pharmacist mentioned there could be an interaction with her thyroid medication, so she decided not to use it as she was concerned  Labs done in March  Colon cancer screening Abnormal mammo in March- she has a follow-up US in July which cleared her: IMPRESSION: Interval improvement and near complete resolution of likely reactive left axillary adenopathy.  RECOMMENDATION: Return to annual screening mammography 06/2020.  covid done shingrix -recommended that she have this done at pharmacy  Wt Readings from Last 3 Encounters:  11/20/19 209 lb (94.8 kg)  10/09/19 210 lb 9.6 oz (95.5 kg)  04/25/18 180 lb (81.6 kg)    Lab Results  Component Value Date   TSH 2.03 07/10/2019   We went over her medications, she is taking them all properly and is compliant with her thyroid medication So far she has not been able to lose weight even do her best efforts  Patient Active Problem List   Diagnosis Date Noted  . Prediabetes 05/26/2019  . Hepatitis B immune 04/04/2016  . Hypothyroidism 10/01/2014  . Memory loss 08/21/2014  . Chronic hepatitis C ([place]) 08/04/2014  . History of other specified conditions  presenting hazards to health 08/04/2014  . Palpitations 10/03/2013  . Essential hypertension 10/03/2013  . Biliary calculi 01/17/2013    Past Medical History:  Diagnosis Date  . Anemia 1976, 1984, 2001, 2003   history of multiple transfusions (3); iron deficiency  . Arthritis    hands, knees B; DDD lumbar  . Blood transfusion without reported diagnosis   . Family history of adverse reaction to anesthesia    pt's mother and daughter both had significant heart rate drops with anesthesia, believes her mother may have had a mini stroke while under anesthesia  . Family history of heart disease   . Heart murmur   . Hepatitis C 04/17/2013   acquired from blood transfusion; s/p [facility] in [place].  Marland Kitchen HTN (hypertension)   . Hypothyroid   . Memory loss 08/21/2014  . Palpitations   . Pre-diabetes    on metformin for prediabetes  . Shortness of breath   . Sleep apnea    tested over 20 year ago, does not use cpap per patient    Past Surgical History:  Procedure Laterality Date  . LIVER BIOPSY      Social History   Tobacco Use  . Smoking status: Never Smoker  . Smokeless tobacco: Never Used  Vaping Use  . Vaping Use: Never used  Substance Use Topics  . Alcohol use: No  . Drug use: No    Family History  Problem Relation Age of Onset  .  Heart disease Mother        heart attack and stroke; passed away in her 60s  . Diabetes Mother   . Hyperlipidemia Mother   . Hypertension Mother   . Stroke Mother   . Heart disease Father        passed away in his 33s  . Hyperlipidemia Father   . Diabetes Brother   . Heart disease Brother   . Hypertension Brother   . Diabetes Brother   . Hypertension Sister     Allergies  Allergen Reactions  . Bee Venom Other (See Comments)    Unknown    Medication list has been reviewed and updated.  Current Outpatient Medications on File Prior to Visit  Medication Sig Dispense Refill  . Blood Glucose Monitoring Suppl  (TRUE METRIX AIR GLUCOSE METER) DEVI 1 each by Does not apply route 3 (three) times daily. Dx code: E11.9 1 each 0  . Brimonidine Tartrate (LUMIFY) 0.025 % SOLN Place 1-2 drops into both eyes daily as needed (for dry eyes).    . clindamycin-benzoyl peroxide (BENZACLIN) gel Apply topically 2 (two) times daily. 25 g 6  . donepezil (ARICEPT) 10 MG tablet Take 1 tablet (10 mg total) by mouth at bedtime. May increase to 2 pills after 4 weeks 30 tablet 5  . EPINEPHrine 0.3 mg/0.3 mL IJ SOAJ injection Inject 0.3 mLs (0.3 mg total) into the skin once as needed for up to 1 dose for anaphylaxis. 2 Device 3  . glucose blood (TRUE METRIX BLOOD GLUCOSE TEST) test strip Check sugar 3 times a day.  Dx code: E11.9 300 each 1  . hydroquinone 4 % cream Apply 1 application topically daily. Use for 2-3 weeks only 28.35 g 1  . losartan-hydrochlorothiazide (HYZAAR) 50-12.5 MG tablet Take 1 tablet by mouth 2 (two) times daily. 180 tablet 3  . lovastatin (MEVACOR) 20 MG tablet Take 1 tablet (20 mg total) at bedtime by mouth. 90 tablet 3  . metFORMIN (GLUCOPHAGE) 500 MG tablet Take 1 tablet (500 mg total) by mouth 2 (two) times daily with a meal. 180 tablet 3  . metoprolol succinate (TOPROL-XL) 50 MG 24 hr tablet Take once daily with or immediately following a meal. 90 tablet 3  . Naphazoline HCl (CLEAR EYES OP) Place 2 drops into both eyes daily as needed (for dry eyes).    . potassium chloride (K-DUR) 10 MEQ tablet Take 1 tablet (10 mEq total) by mouth daily. 30 tablet 1  . SYNTHROID 100 MCG tablet TAKE 1 TABLET BY MOUTH EVERY DAY BEFORE BREAKFAST 30 tablet 3  . tretinoin (RETIN-A) 0.05 % cream APPLY TO AFFECTED AREA TOPICALLY AT BEDTIME AS NEEDED FOR ACNE 45 g 1  . TRUEplus Lancets 33G MISC Check sugar 3 times a day.  Dx code: E11.9 300 each 1  . Vitamin D, Ergocalciferol, (DRISDOL) 50000 units CAPS capsule TAKE ONE CAPSULE BY MOUTH ONCE A WEEK 12 capsule 0   No current facility-administered medications on file prior to  visit.    Review of Systems:  As per HPI- otherwise negative.   Physical Examination: Vitals:   11/20/19 1526  BP: 126/78  Pulse: 86  Resp: 17  SpO2: 97%   Vitals:   11/20/19 1526  Weight: 209 lb (94.8 kg)  Height: 5' 4.5" (1.638 m)   Body mass index is 35.32 kg/m. Ideal Body Weight: Weight in (lb) to have BMI = 25: 147.6  GEN: no acute distress.  Obese, otherwise looks well HEENT: Atraumatic,  Normocephalic.  Ears and Nose: No external deformity. CV: RRR, No M/G/R. No JVD. No thrill. No extra heart sounds. PULM: CTA B, no wheezes, crackles, rhonchi. No retractions. No resp. distress. No accessory muscle use. ABD: S, NT, ND, +BS. No rebound. No HSM. EXTR: No c/c/e PSYCH: Normally interactive. Conversant.    Assessment and Plan: Obesity (BMI 30.0-34.9) - Plan: Lipid panel  Prediabetes - Plan: Hemoglobin X5A, Basic metabolic panel  Other specified hypothyroidism - Plan: TSH  Screening for hyperlipidemia - Plan: Lipid panel  Essential hypertension  Patient with obesity, prediabetes, controlled hypertension, hypothyroidism  Pt decided not to use phentermine after all -she was concerned for possible interaction with her thyroid medication After discussion today, she is interested in potentially trying [place] She understands this medication may not be covered by her insurance, she would like me to prescribe it and find out what her benefits may be.  She understands it is an injectable medication This visit occurred during the SARS-CoV-2 public health emergency.  Safety protocols were in place, including screening questions prior to the visit, additional usage of staff PPE, and extensive cleaning of exam room while observing appropriate contact time as indicated for disinfecting solutions.    Signed Lamar Blinks, MD  Received her labs as below 8/6-  A1c stable in pre-diabetes range Need to step up her statin therapy  Results for orders placed or performed in  visit on 11/20/19  Hemoglobin A1c  Result Value Ref Range   Hgb A1c MFr Bld 5.9 4.6 - 6.5 %  Lipid panel  Result Value Ref Range   Cholesterol 173 0 - 200 mg/dL   Triglycerides 146.0 0 - 149 mg/dL   HDL 36.40 (L) >39.00 mg/dL   VLDL 29.2 0.0 - 40.0 mg/dL   LDL Cholesterol 107 (H) 0 - 99 mg/dL   Total CHOL/HDL Ratio 5    NonHDL 136.13   TSH  Result Value Ref Range   TSH 0.41 0.35 - 4.50 uIU/mL  Basic metabolic panel  Result Value Ref Range   Sodium 138 135 - 145 mEq/L   Potassium 4.8 3.5 - 5.1 mEq/L   Chloride 102 96 - 112 mEq/L   CO2 31 19 - 32 mEq/L   Glucose, Bld 87 70 - 99 mg/dL   BUN 12 6 - 23 mg/dL   Creatinine, Ser 0.86 0.40 - 1.20 mg/dL   GFR 80.73 >60.00 mL/min   Calcium 10.1 8.4 - 10.5 mg/dL

## 2019-11-16 NOTE — Patient Instructions (Addendum)
Good to see you again today! I will be in touch with your labs Follow-up ultrasound looked fine!  Repeat routine mammogram in March next year I will be in touch with your labs asap   Please consider getting the shingles vaccine at your pharmacy  Alli may be a useful weight loss aid if you want to try it

## 2019-11-20 ENCOUNTER — Ambulatory Visit (INDEPENDENT_AMBULATORY_CARE_PROVIDER_SITE_OTHER): Payer: Medicare HMO | Admitting: Family Medicine

## 2019-11-20 ENCOUNTER — Other Ambulatory Visit: Payer: Self-pay

## 2019-11-20 ENCOUNTER — Encounter: Payer: Self-pay | Admitting: Family Medicine

## 2019-11-20 VITALS — BP 126/78 | HR 86 | Resp 17 | Ht 64.5 in | Wt 209.0 lb

## 2019-11-20 DIAGNOSIS — Z13220 Encounter for screening for lipoid disorders: Secondary | ICD-10-CM | POA: Diagnosis not present

## 2019-11-20 DIAGNOSIS — E669 Obesity, unspecified: Secondary | ICD-10-CM

## 2019-11-20 DIAGNOSIS — I10 Essential (primary) hypertension: Secondary | ICD-10-CM | POA: Diagnosis not present

## 2019-11-20 DIAGNOSIS — R7303 Prediabetes: Secondary | ICD-10-CM

## 2019-11-20 DIAGNOSIS — E038 Other specified hypothyroidism: Secondary | ICD-10-CM

## 2019-11-20 MED ORDER — COMFORT EZ PEN NEEDLES 32G X 5 MM MISC: 3 refills | Status: DC

## 2019-11-20 MED ORDER — SAXENDA 18 MG/3ML ~~LOC~~ SOPN: PEN_INJECTOR | SUBCUTANEOUS | 3 refills | Status: DC

## 2019-11-21 ENCOUNTER — Other Ambulatory Visit: Payer: Self-pay | Admitting: Family Medicine

## 2019-11-21 LAB — LIPID PANEL
Cholesterol: 173 mg/dL (ref 0–200)
HDL: 36.4 mg/dL — ABNORMAL LOW
LDL Cholesterol: 107 mg/dL — ABNORMAL HIGH (ref 0–99)
NonHDL: 136.13
Total CHOL/HDL Ratio: 5
Triglycerides: 146 mg/dL (ref 0.0–149.0)
VLDL: 29.2 mg/dL (ref 0.0–40.0)

## 2019-11-21 LAB — BASIC METABOLIC PANEL
BUN: 12 mg/dL (ref 6–23)
CO2: 31 mEq/L (ref 19–32)
Calcium: 10.1 mg/dL (ref 8.4–10.5)
Chloride: 102 mEq/L (ref 96–112)
Creatinine, Ser: 0.86 mg/dL (ref 0.40–1.20)
GFR: 80.73 mL/min (ref >60.00)
Glucose, Bld: 87 mg/dL (ref 70–99)
Potassium: 4.8 mEq/L (ref 3.5–5.1)
Sodium: 138 mEq/L (ref 135–145)

## 2019-11-21 LAB — HEMOGLOBIN A1C: Hgb A1c MFr Bld: 5.9 % (ref 4.6–6.5)

## 2019-11-21 LAB — TSH: TSH: 0.41 u[IU]/mL (ref 0.35–4.50)

## 2019-11-29 ENCOUNTER — Other Ambulatory Visit: Payer: Self-pay | Admitting: Family Medicine

## 2019-11-29 DIAGNOSIS — L578 Other skin changes due to chronic exposure to nonionizing radiation: Secondary | ICD-10-CM

## 2019-12-12 DIAGNOSIS — N8110 Cystocele, unspecified: Secondary | ICD-10-CM | POA: Diagnosis not present

## 2019-12-12 DIAGNOSIS — N3946 Mixed incontinence: Secondary | ICD-10-CM | POA: Diagnosis not present

## 2020-01-02 ENCOUNTER — Telehealth: Payer: Self-pay | Admitting: Family Medicine

## 2020-01-02 DIAGNOSIS — E669 Obesity, unspecified: Secondary | ICD-10-CM

## 2020-01-02 NOTE — Telephone Encounter (Signed)
Medication:  Insulin Pen Needle (COMFORT EZ PEN NEEDLES) 32G X 5 MM MISC [[id number]]   glucose blood (TRUE METRIX BLOOD GLUCOSE TEST) test strip [[id number]]    Has the patient contacted their pharmacy?  (If no, request that the patient contact the pharmacy for the refill.) (If yes, when and what did the pharmacy advise?)     Preferred Pharmacy (with phone number or street name): [id number] [place], [place]  [address] [name] [address]  Phone:  [phone] Fax:  [phone]      Agent: Please be advised that RX refills may take up to 3 business days. We ask that you follow-up with your pharmacy.

## 2020-01-05 MED ORDER — COMFORT EZ PEN NEEDLES 32G X 5 MM MISC: 3 refills | Status: AC

## 2020-01-05 MED ORDER — TRUE METRIX BLOOD GLUCOSE TEST VI STRP: ORAL_STRIP | 1 refills | Status: DC

## 2020-01-05 NOTE — Telephone Encounter (Signed)
Patients medication/supplies have been refilled to pharmacy.

## 2020-01-06 ENCOUNTER — Other Ambulatory Visit: Payer: Self-pay | Admitting: *Deleted

## 2020-01-06 MED ORDER — TRUEPLUS LANCETS 33G MISC: 1 refills | Status: DC

## 2020-01-06 MED ORDER — ACCU-CHEK SOFTCLIX LANCETS MISC
1 refills | Status: DC
Start: 2020-01-06 — End: 2020-07-29

## 2020-01-06 MED ORDER — ACCU-CHEK GUIDE W/DEVICE KIT: PACK | 0 refills | Status: DC

## 2020-01-06 MED ORDER — ACCU-CHEK GUIDE VI STRP: ORAL_STRIP | 1 refills | Status: DC

## 2020-01-06 NOTE — Telephone Encounter (Signed)
Patient called and stated the we sent in the wrong needles.  She needed the things to prick her finger.  She also wanted to know if she can update her machine.  Advised her that to make sure when she call in to ask for lancets instead of needles because it is a difference so it will not be a mix up.  New rx for machine, test strips, and lancets sent in for 3 month supply.

## 2020-01-28 DIAGNOSIS — N8110 Cystocele, unspecified: Secondary | ICD-10-CM | POA: Diagnosis not present

## 2020-01-28 DIAGNOSIS — N3946 Mixed incontinence: Secondary | ICD-10-CM | POA: Diagnosis not present

## 2020-02-06 ENCOUNTER — Other Ambulatory Visit: Payer: Self-pay | Admitting: Family Medicine

## 2020-02-06 DIAGNOSIS — L578 Other skin changes due to chronic exposure to nonionizing radiation: Secondary | ICD-10-CM

## 2020-02-27 DIAGNOSIS — N8110 Cystocele, unspecified: Secondary | ICD-10-CM | POA: Diagnosis not present

## 2020-02-27 DIAGNOSIS — N3946 Mixed incontinence: Secondary | ICD-10-CM | POA: Diagnosis not present

## 2020-03-10 ENCOUNTER — Other Ambulatory Visit: Payer: Self-pay | Admitting: Family Medicine

## 2020-03-10 DIAGNOSIS — I10 Essential (primary) hypertension: Secondary | ICD-10-CM

## 2020-03-29 DIAGNOSIS — N3946 Mixed incontinence: Secondary | ICD-10-CM | POA: Diagnosis not present

## 2020-03-29 DIAGNOSIS — N8110 Cystocele, unspecified: Secondary | ICD-10-CM | POA: Diagnosis not present

## 2020-03-31 ENCOUNTER — Other Ambulatory Visit: Payer: Self-pay | Admitting: Family Medicine

## 2020-03-31 DIAGNOSIS — L578 Other skin changes due to chronic exposure to nonionizing radiation: Secondary | ICD-10-CM

## 2020-04-03 ENCOUNTER — Other Ambulatory Visit: Payer: Self-pay | Admitting: Family Medicine

## 2020-04-03 DIAGNOSIS — E038 Other specified hypothyroidism: Secondary | ICD-10-CM

## 2020-04-03 DIAGNOSIS — L578 Other skin changes due to chronic exposure to nonionizing radiation: Secondary | ICD-10-CM

## 2020-04-30 DIAGNOSIS — N3946 Mixed incontinence: Secondary | ICD-10-CM | POA: Diagnosis not present

## 2020-04-30 DIAGNOSIS — N8110 Cystocele, unspecified: Secondary | ICD-10-CM | POA: Diagnosis not present

## 2020-05-13 ENCOUNTER — Other Ambulatory Visit: Payer: Self-pay | Admitting: Family Medicine

## 2020-05-13 DIAGNOSIS — I10 Essential (primary) hypertension: Secondary | ICD-10-CM

## 2020-05-31 DIAGNOSIS — N8110 Cystocele, unspecified: Secondary | ICD-10-CM | POA: Diagnosis not present

## 2020-05-31 DIAGNOSIS — N3946 Mixed incontinence: Secondary | ICD-10-CM | POA: Diagnosis not present

## 2020-06-01 ENCOUNTER — Telehealth: Payer: Self-pay

## 2020-06-01 NOTE — Telephone Encounter (Signed)
PA approved.   PA Case: [id number], Status: Approved, Coverage Starts on: 04/17/2020 12:00:00 AM, Coverage Ends on: 04/16/2021 12:00:00 AM. Questions? Contact [phone].

## 2020-06-01 NOTE — Telephone Encounter (Signed)
PA initiated via Covermymeds; KEY: [id number]. Awaiting determination.

## 2020-06-03 ENCOUNTER — Other Ambulatory Visit: Payer: Self-pay | Admitting: Family Medicine

## 2020-06-03 DIAGNOSIS — I10 Essential (primary) hypertension: Secondary | ICD-10-CM

## 2020-06-26 ENCOUNTER — Other Ambulatory Visit: Payer: Self-pay | Admitting: Family Medicine

## 2020-06-26 DIAGNOSIS — R7303 Prediabetes: Secondary | ICD-10-CM

## 2020-07-01 DIAGNOSIS — N3946 Mixed incontinence: Secondary | ICD-10-CM | POA: Diagnosis not present

## 2020-07-01 DIAGNOSIS — N8110 Cystocele, unspecified: Secondary | ICD-10-CM | POA: Diagnosis not present

## 2020-07-08 ENCOUNTER — Other Ambulatory Visit: Payer: Self-pay | Admitting: Family Medicine

## 2020-07-08 DIAGNOSIS — I10 Essential (primary) hypertension: Secondary | ICD-10-CM

## 2020-07-09 ENCOUNTER — Other Ambulatory Visit: Payer: Self-pay

## 2020-07-13 ENCOUNTER — Other Ambulatory Visit (HOSPITAL_BASED_OUTPATIENT_CLINIC_OR_DEPARTMENT_OTHER): Payer: Self-pay | Admitting: Family Medicine

## 2020-07-13 DIAGNOSIS — Z1231 Encounter for screening mammogram for malignant neoplasm of breast: Secondary | ICD-10-CM

## 2020-07-27 NOTE — Patient Instructions (Addendum)
It was good to see you again today, I will be in touch with your labs as soon as possible  You can get your fourth COVID dose at any point now if you like I also would recommend the shingles vaccine series from your pharmacy  Please call your eye doctor today and schedule a visit asap We will get you in to see neurology as well about your headaches and vision changes If this changes or worsens in the meantime please let me know We will set up a virtual colonoscopy for you to screen for colon cancer

## 2020-07-27 NOTE — Progress Notes (Addendum)
[place] at [facility] [address], [place], [place] [address] [phone]  Date:  07/29/2020   Name:  Robyn Montes   DOB:  [birth year]   MRN:  [id number]  PCP:  Darreld Mclean, MD    Chief Complaint: Annual Exam   History of Present Illness:  Robyn Montes is a 65 y.o. very pleasant female patient who presents with the following:  Wt Readings from Last 3 Encounters:  07/29/20 209 lb (94.8 kg)  11/20/19 209 lb (94.8 kg)  10/09/19 210 lb 9.6 oz (95.5 kg)   Patient here today for physical exam- History of prediabetes, hypertension, hypothyroidism, hepatitis C status post curative treatment  Last seen by myself August 2021.  I prescribed Saxenda for obesity-she never ended up starting this medication  Lab Results  Component Value Date   TSH 0.41 11/20/2019   Losartan/HCTZ Toprol-XL Lovastatin Metformin  Colon cancer screening- she did a CT virtual colonoscopy in 2016.  She today notes that her abdomen has been "swollen and tender" for several months or perhaps longer.  Her history is rather vague.  She wishes to repeat virtual colon, this is now due as it has been 5 years since her last Pap is up-to-date Mammogram done today  Most recent labs done in August  She notes that her vision may " go in and out," this may be associated with what she describes as a "pressure and heaviness" in the right side of her head-however she states this is not a headache?  It is hard for her to describe how exactly her vision is altered  She has noticed this for about 8 months- however over the last month she also notes that her vision may occasionally "go really dark" - will effect both eyes.  Will start with "little black dots" and then expand until her entire bilateral vision appears to be dimmed-this has apparently been occurring periodically for about 1 month She notes that her daughter was quite concerned over this started  happening, but she did not mention until now.  She states that she has had her eyes examined and nothing was wrong with her eyes-however, on further questioning her last eye exam was nearly a year ago   I did refer Jemiah to see neurology a few years ago due to chronic memory loss.  I do not think she ever ended up being seen. I also prescribed Aricept several years ago, I do not believe she is currently taking this medication  Her history of recent illness is very vague and rather confusing-she is having a hard time with chronological order of events and details I do suspect she has memory loss which makes her history more difficult to interpret  Patient Active Problem List   Diagnosis Date Noted  . Prediabetes 05/26/2019  . Hepatitis B immune 04/04/2016  . Hypothyroidism 10/01/2014  . Memory loss 08/21/2014  . Chronic hepatitis C ([place]) 08/04/2014  . History of other specified conditions presenting hazards to health 08/04/2014  . Palpitations 10/03/2013  . Essential hypertension 10/03/2013  . Biliary calculi 01/17/2013    Past Medical History:  Diagnosis Date  . Anemia 1976, 1984, 2001, 2003   history of multiple transfusions (3); iron deficiency  . Arthritis    hands, knees B; DDD lumbar  . Blood transfusion without reported diagnosis   . Family history of adverse reaction to anesthesia    pt's mother and daughter both  had significant heart rate drops with anesthesia, believes her mother may have had a mini stroke while under anesthesia  . Family history of heart disease   . Heart murmur   . Hepatitis C 04/17/2013   acquired from blood transfusion; s/p [facility] treatment [facility] in [place].  Marland Kitchen HTN (hypertension)   . Hypothyroid   . Memory loss 08/21/2014  . Palpitations   . Pre-diabetes    on metformin for prediabetes  . Shortness of breath   . Sleep apnea    tested over 20 year ago, does not use cpap per patient    Past Surgical History:  Procedure Laterality Date   . LIVER BIOPSY      Social History   Tobacco Use  . Smoking status: Never Smoker  . Smokeless tobacco: Never Used  Vaping Use  . Vaping Use: Never used  Substance Use Topics  . Alcohol use: No  . Drug use: No    Family History  Problem Relation Age of Onset  . Heart disease Mother        heart attack and stroke; passed away in her 107s  . Diabetes Mother   . Hyperlipidemia Mother   . Hypertension Mother   . Stroke Mother   . Heart disease Father        passed away in his 99s  . Hyperlipidemia Father   . Diabetes Brother   . Heart disease Brother   . Hypertension Brother   . Diabetes Brother   . Hypertension Sister   . Breast cancer Maternal Aunt     Allergies  Allergen Reactions  . Bee Venom Other (See Comments)    Unknown    Medication list has been reviewed and updated.  Current Outpatient Medications on File Prior to Visit  Medication Sig Dispense Refill  . Blood Glucose Monitoring Suppl (ACCU-CHEK GUIDE) w/Device KIT Check blood sugar 3 times a day.  Dx Code: E11.9 1 kit 0  . Brimonidine Tartrate 0.025 % SOLN Place 1-2 drops into both eyes daily as needed (for dry eyes).    . clindamycin-benzoyl peroxide (BENZACLIN) gel Apply topically 2 (two) times daily. 25 g 6  . donepezil (ARICEPT) 10 MG tablet Take 1 tablet (10 mg total) by mouth at bedtime. May increase to 2 pills after 4 weeks 30 tablet 5  . EPINEPHrine 0.3 mg/0.3 mL IJ SOAJ injection Inject 0.3 mLs (0.3 mg total) into the skin once as needed for up to 1 dose for anaphylaxis. 2 Device 3  . glucose blood (ACCU-CHEK GUIDE) test strip Check blood sugar 3 times a day.  Dx Code: E11.9 300 each 1  . hydroquinone 4 % cream Apply 1 application topically daily. Use for 2-3 weeks only 28.35 g 1  . Insulin Pen Needle (COMFORT EZ PEN NEEDLES) 32G X 5 MM MISC Use daily for Saxenda injection 100 each 3  . levothyroxine (SYNTHROID) 100 MCG tablet Take 1 tablet (100 mcg total) by mouth daily before breakfast. 90 tablet  1  . Naphazoline HCl (CLEAR EYES OP) Place 2 drops into both eyes daily as needed (for dry eyes).    . potassium chloride (K-DUR) 10 MEQ tablet Take 1 tablet (10 mEq total) by mouth daily. 30 tablet 1  . tretinoin (RETIN-A) 0.05 % cream Apply topically at bedtime as needed. 45 g 3  . Vitamin D, Ergocalciferol, (DRISDOL) 50000 units CAPS capsule TAKE ONE CAPSULE BY MOUTH ONCE A WEEK 12 capsule 0   No current facility-administered medications  on file prior to visit.    Review of Systems:  As per HPI- otherwise negative.   Physical Examination: Vitals:   07/29/20 1347  BP: 122/72  Pulse: 70  Resp: 17  Temp: 98.4 F (36.9 C)  SpO2: 97%   Vitals:   07/29/20 1347  Weight: 209 lb (94.8 kg)  Height: 5' 4.5" (1.638 m)   Body mass index is 35.32 kg/m. Ideal Body Weight: Weight in (lb) to have BMI = 25: 147.6  GEN: no acute distress.  Obese, looks well  HEENT: Atraumatic, Normocephalic.  Ears and Nose: No external deformity. CV: RRR, No M/G/R. No JVD. No thrill. No extra heart sounds.  Bilateral TM wnl, oropharynx normal.  PEERL,EOMI.   Limited funduscopic exam is normal PULM: CTA B, no wheezes, crackles, rhonchi. No retractions. No resp. distress. No accessory muscle use. ABD: S, NT, ND, +BS. No rebound. No HSM. EXTR: No c/c/e PSYCH: Normally interactive. Conversant.  Normal strength, sensation, DTR of all extremities.  Normal motion and sensation of face  Assessment and Plan: Physical exam  Prediabetes - Plan: Hemoglobin A1c, CANCELED: Hemoglobin A1c  Other specified hypothyroidism - Plan: TSH, CANCELED: TSH  Obesity (BMI 30.0-34.9)  Essential hypertension - Plan: CBC, Comprehensive metabolic panel, CANCELED: Comprehensive metabolic panel, CANCELED: CBC  Vitamin D deficiency - Plan: VITAMIN D 25 Hydroxy (Vit-D Deficiency, Fractures), CANCELED: VITAMIN D 25 Hydroxy (Vit-D Deficiency, Fractures)  Screening for hyperlipidemia - Plan: Lipid panel, CANCELED: Lipid panel,  CANCELED: Lipid panel  Screening for malignant neoplasm of colon - Plan: CT VIRTUAL COLONOSCOPY DIAGNOSTIC  Memory loss - Plan: Ambulatory referral to Neurology  Vision changes - Plan: Ambulatory referral to Neurology  Essential hypertension, benign - Plan: losartan-hydrochlorothiazide (HYZAAR) 50-12.5 MG tablet, metoprolol succinate (TOPROL-XL) 50 MG 24 hr tablet  Dyslipidemia - Plan: lovastatin (MEVACOR) 20 MG tablet  Borderline diabetes - Plan: metFORMIN (GLUCOPHAGE) 500 MG tablet  Pt here today for a CPE Routine labs are pending as above, I have refilled her medications Will need refill levothyroxine pending TSH level I have ordered a CT virtual colonoscopy per patient request Concern of vision change, noted continued issues with memory It sounds as though she may be experiencing a migraine with aura, but her history is so vague as to make this determination difficult.  We had had concerns about her memory for several years, she was referred to neurology but I do not believe she ended up being seen. I asked her to contact her eye doctor immediately and schedule an eye exam.  We will also place a referral for evaluation Will plan further follow- up pending labs. If any change or worsening she will seek care   This visit occurred during the SARS-CoV-2 public health emergency.  Safety protocols were in place, including screening questions prior to the visit, additional usage of staff PPE, and extensive cleaning of exam room while observing appropriate contact time as indicated for disinfecting solutions.    Signed Lamar Blinks, MD  Received her labs as follows, 4/15.  Message to patient Refill her thyroid medication, patient request brand Synthroid only Results for orders placed or performed in visit on 07/29/20  Lipid panel  Result Value Ref Range   Cholesterol 167 <200 mg/dL   HDL 41 (L) > OR = 50 mg/dL   Triglycerides 64 <150 mg/dL   LDL Cholesterol (Calc) 111 (H) mg/dL  (calc)   Total CHOL/HDL Ratio 4.1 <5.0 (calc)   Non-HDL Cholesterol (Calc) 126 <130 mg/dL (calc)  CBC  Result Value Ref Range   WBC 8.4 3.8 - 10.8 Thousand/uL   RBC 4.48 3.80 - 5.10 Million/uL   Hemoglobin 11.9 11.7 - 15.5 g/dL   HCT 36.8 35.0 - 45.0 %   MCV 82.1 80.0 - 100.0 fL   MCH 26.6 (L) 27.0 - 33.0 pg   MCHC 32.3 32.0 - 36.0 g/dL   RDW 14.6 11.0 - 15.0 %   Platelets 217 140 - 400 Thousand/uL   MPV 12.6 (H) 7.5 - 12.5 fL  Hemoglobin A1c  Result Value Ref Range   Hgb A1c MFr Bld 5.8 (H) <5.7 % of total Hgb   Mean Plasma Glucose 120 mg/dL   eAG (mmol/L) 6.6 mmol/L  Comprehensive metabolic panel  Result Value Ref Range   Glucose, Bld 81 65 - 99 mg/dL   BUN 8 7 - 25 mg/dL   Creat 0.77 0.50 - 0.99 mg/dL   BUN/Creatinine Ratio NOT APPLICABLE 6 - 22 (calc)   Sodium 141 135 - 146 mmol/L   Potassium 4.1 3.5 - 5.3 mmol/L   Chloride 104 98 - 110 mmol/L   CO2 26 20 - 32 mmol/L   Calcium 9.6 8.6 - 10.4 mg/dL   Total Protein 7.2 6.1 - 8.1 g/dL   Albumin 4.3 3.6 - 5.1 g/dL   Globulin 2.9 1.9 - 3.7 g/dL (calc)   AG Ratio 1.5 1.0 - 2.5 (calc)   Total Bilirubin 0.5 0.2 - 1.2 mg/dL   Alkaline phosphatase (APISO) 61 37 - 153 U/L   AST 17 10 - 35 U/L   ALT 10 6 - 29 U/L  TSH  Result Value Ref Range   TSH 0.70 0.40 - 4.50 mIU/L  VITAMIN D 25 Hydroxy (Vit-D Deficiency, Fractures)  Result Value Ref Range   Vit D, 25-Hydroxy 36 30 - 100 ng/mL

## 2020-07-29 ENCOUNTER — Ambulatory Visit (INDEPENDENT_AMBULATORY_CARE_PROVIDER_SITE_OTHER): Payer: Medicare HMO | Admitting: Family Medicine

## 2020-07-29 ENCOUNTER — Other Ambulatory Visit: Payer: Self-pay

## 2020-07-29 ENCOUNTER — Encounter (HOSPITAL_BASED_OUTPATIENT_CLINIC_OR_DEPARTMENT_OTHER): Payer: Self-pay

## 2020-07-29 ENCOUNTER — Ambulatory Visit (HOSPITAL_BASED_OUTPATIENT_CLINIC_OR_DEPARTMENT_OTHER)
Admission: RE | Admit: 2020-07-29 | Discharge: 2020-07-29 | Disposition: A | Discharge status: Home / Self Care | Payer: Medicare HMO | Source: Ambulatory Visit | Attending: Family Medicine | Admitting: Family Medicine

## 2020-07-29 VITALS — BP 122/72 | HR 70 | Temp 98.4°F | Resp 17 | Ht 64.5 in | Wt 209.0 lb

## 2020-07-29 DIAGNOSIS — E559 Vitamin D deficiency, unspecified: Secondary | ICD-10-CM

## 2020-07-29 DIAGNOSIS — R7303 Prediabetes: Secondary | ICD-10-CM

## 2020-07-29 DIAGNOSIS — H539 Unspecified visual disturbance: Secondary | ICD-10-CM

## 2020-07-29 DIAGNOSIS — E669 Obesity, unspecified: Secondary | ICD-10-CM | POA: Diagnosis not present

## 2020-07-29 DIAGNOSIS — Z0000 Encounter for general adult medical examination without abnormal findings: Secondary | ICD-10-CM

## 2020-07-29 DIAGNOSIS — R413 Other amnesia: Secondary | ICD-10-CM | POA: Diagnosis not present

## 2020-07-29 DIAGNOSIS — Z13220 Encounter for screening for lipoid disorders: Secondary | ICD-10-CM | POA: Diagnosis not present

## 2020-07-29 DIAGNOSIS — E038 Other specified hypothyroidism: Secondary | ICD-10-CM

## 2020-07-29 DIAGNOSIS — Z1231 Encounter for screening mammogram for malignant neoplasm of breast: Secondary | ICD-10-CM | POA: Diagnosis not present

## 2020-07-29 DIAGNOSIS — Z1211 Encounter for screening for malignant neoplasm of colon: Secondary | ICD-10-CM

## 2020-07-29 DIAGNOSIS — E785 Hyperlipidemia, unspecified: Secondary | ICD-10-CM

## 2020-07-29 DIAGNOSIS — I10 Essential (primary) hypertension: Secondary | ICD-10-CM | POA: Diagnosis not present

## 2020-07-29 MED ORDER — METOPROLOL SUCCINATE ER 50 MG PO TB24: 50.0000 mg | ORAL_TABLET | Freq: Every day | ORAL | 3 refills | Status: DC

## 2020-07-29 MED ORDER — LOSARTAN POTASSIUM-HCTZ 50-12.5 MG PO TABS: 1.0000 | ORAL_TABLET | Freq: Two times a day (BID) | ORAL | 3 refills | Status: DC

## 2020-07-29 MED ORDER — LOVASTATIN 20 MG PO TABS: 20.0000 mg | ORAL_TABLET | Freq: Every day | ORAL | 3 refills | Status: DC

## 2020-07-29 MED ORDER — METFORMIN HCL 500 MG PO TABS: 500.0000 mg | ORAL_TABLET | Freq: Two times a day (BID) | ORAL | 3 refills | Status: DC

## 2020-07-29 MED ORDER — ACCU-CHEK SOFTCLIX LANCETS MISC: 1 refills | Status: DC

## 2020-07-30 ENCOUNTER — Encounter: Payer: Self-pay | Admitting: Family Medicine

## 2020-07-30 LAB — VITAMIN D 25 HYDROXY (VIT D DEFICIENCY, FRACTURES): Vit D, 25-Hydroxy: 36 ng/mL (ref 30–100)

## 2020-07-30 LAB — COMPREHENSIVE METABOLIC PANEL
AG Ratio: 1.5 (calc) (ref 1.0–2.5)
ALT: 10 U/L (ref 6–29)
AST: 17 U/L (ref 10–35)
Albumin: 4.3 g/dL (ref 3.6–5.1)
Alkaline phosphatase (APISO): 61 U/L (ref 37–153)
BUN: 8 mg/dL (ref 7–25)
CO2: 26 mmol/L (ref 20–32)
Calcium: 9.6 mg/dL (ref 8.6–10.4)
Chloride: 104 mmol/L (ref 98–110)
Creat: 0.77 mg/dL (ref 0.50–0.99)
Globulin: 2.9 g/dL (calc) (ref 1.9–3.7)
Glucose, Bld: 81 mg/dL (ref 65–99)
Potassium: 4.1 mmol/L (ref 3.5–5.3)
Sodium: 141 mmol/L (ref 135–146)
Total Bilirubin: 0.5 mg/dL (ref 0.2–1.2)
Total Protein: 7.2 g/dL (ref 6.1–8.1)

## 2020-07-30 LAB — LIPID PANEL
Cholesterol: 167 mg/dL (ref <200)
HDL: 41 mg/dL — ABNORMAL LOW (ref >=50)
LDL Cholesterol (Calc): 111 mg/dL (calc) — ABNORMAL HIGH
Non-HDL Cholesterol (Calc): 126 mg/dL (calc) (ref <130)
Total CHOL/HDL Ratio: 4.1 (calc) (ref <5.0)
Triglycerides: 64 mg/dL (ref <150)

## 2020-07-30 LAB — TSH: TSH: 0.7 mIU/L (ref 0.40–4.50)

## 2020-07-30 LAB — CBC
HCT: 36.8 % (ref 35.0–45.0)
Hemoglobin: 11.9 g/dL (ref 11.7–15.5)
MCH: 26.6 pg — ABNORMAL LOW (ref 27.0–33.0)
MCHC: 32.3 g/dL (ref 32.0–36.0)
MCV: 82.1 fL (ref 80.0–100.0)
MPV: 12.6 fL — ABNORMAL HIGH (ref 7.5–12.5)
Platelets: 217 10*3/uL (ref 140–400)
RBC: 4.48 10*6/uL (ref 3.80–5.10)
RDW: 14.6 % (ref 11.0–15.0)
WBC: 8.4 10*3/uL (ref 3.8–10.8)

## 2020-07-30 LAB — HEMOGLOBIN A1C
Hgb A1c MFr Bld: 5.8 % of total Hgb — ABNORMAL HIGH (ref <5.7)
Mean Plasma Glucose: 120 mg/dL
eAG (mmol/L): 6.6 mmol/L

## 2020-07-30 MED ORDER — SYNTHROID 100 MCG PO TABS: 100.0000 ug | ORAL_TABLET | Freq: Every day | ORAL | 3 refills | Status: DC

## 2020-07-30 NOTE — Addendum Note (Signed)
Addended by: Lamar Blinks C on: 07/30/2020 08:44 AM   Modules accepted: Orders

## 2020-08-04 DIAGNOSIS — N3946 Mixed incontinence: Secondary | ICD-10-CM | POA: Diagnosis not present

## 2020-08-04 DIAGNOSIS — N8110 Cystocele, unspecified: Secondary | ICD-10-CM | POA: Diagnosis not present

## 2020-08-10 ENCOUNTER — Encounter: Payer: Self-pay | Admitting: Family Medicine

## 2020-08-17 ENCOUNTER — Other Ambulatory Visit: Payer: Self-pay | Admitting: Family Medicine

## 2020-08-18 ENCOUNTER — Other Ambulatory Visit: Payer: Self-pay | Admitting: Family Medicine

## 2020-08-18 DIAGNOSIS — I10 Essential (primary) hypertension: Secondary | ICD-10-CM

## 2020-08-20 ENCOUNTER — Inpatient Hospital Stay: Admission: RE | Admit: 2020-08-20 | Payer: Medicare HMO | Source: Ambulatory Visit

## 2020-09-06 DIAGNOSIS — N8110 Cystocele, unspecified: Secondary | ICD-10-CM | POA: Diagnosis not present

## 2020-09-06 DIAGNOSIS — N3946 Mixed incontinence: Secondary | ICD-10-CM | POA: Diagnosis not present

## 2020-10-07 DIAGNOSIS — N8110 Cystocele, unspecified: Secondary | ICD-10-CM | POA: Diagnosis not present

## 2020-10-07 DIAGNOSIS — N3946 Mixed incontinence: Secondary | ICD-10-CM | POA: Diagnosis not present

## 2020-10-09 ENCOUNTER — Other Ambulatory Visit: Payer: Self-pay | Admitting: Family Medicine

## 2020-10-09 DIAGNOSIS — L719 Rosacea, unspecified: Secondary | ICD-10-CM

## 2020-11-01 ENCOUNTER — Telehealth: Payer: Self-pay | Admitting: Family Medicine

## 2020-11-01 ENCOUNTER — Other Ambulatory Visit: Payer: Self-pay | Admitting: Family Medicine

## 2020-11-01 DIAGNOSIS — L719 Rosacea, unspecified: Secondary | ICD-10-CM

## 2020-11-01 DIAGNOSIS — E038 Other specified hypothyroidism: Secondary | ICD-10-CM

## 2020-11-01 MED ORDER — CLINDAMYCIN PHOS-BENZOYL PEROX 1-5 % EX GEL: CUTANEOUS | 6 refills | Status: DC

## 2020-11-01 NOTE — Telephone Encounter (Signed)
Patient states it cost the same regardless of the quantity . Patient would like medication quantity to be 50g instead of 25  clindamycin-benzoyl peroxide (BENZACLIN) gel   [id number] [place] [place]  [address] [name] [address]  Phone:  [phone]  Fax:  [phone]

## 2020-11-08 DIAGNOSIS — N3946 Mixed incontinence: Secondary | ICD-10-CM | POA: Diagnosis not present

## 2020-11-08 DIAGNOSIS — N8110 Cystocele, unspecified: Secondary | ICD-10-CM | POA: Diagnosis not present

## 2020-12-03 ENCOUNTER — Other Ambulatory Visit: Payer: Self-pay | Admitting: Family Medicine

## 2020-12-09 ENCOUNTER — Other Ambulatory Visit: Payer: Self-pay | Admitting: Family Medicine

## 2020-12-09 DIAGNOSIS — L578 Other skin changes due to chronic exposure to nonionizing radiation: Secondary | ICD-10-CM

## 2020-12-09 DIAGNOSIS — N3946 Mixed incontinence: Secondary | ICD-10-CM | POA: Diagnosis not present

## 2020-12-09 DIAGNOSIS — N8110 Cystocele, unspecified: Secondary | ICD-10-CM | POA: Diagnosis not present

## 2020-12-17 ENCOUNTER — Telehealth: Payer: Self-pay | Admitting: *Deleted

## 2020-12-17 ENCOUNTER — Encounter: Payer: Self-pay | Admitting: *Deleted

## 2020-12-17 DIAGNOSIS — L700 Acne vulgaris: Secondary | ICD-10-CM | POA: Insufficient documentation

## 2020-12-17 NOTE — Telephone Encounter (Signed)
Prior auth started via cover my meds.  Awaiting determination.  Key: [id number]

## 2020-12-22 NOTE — Telephone Encounter (Signed)
PA Case: [id number], Status: Approved, Coverage Starts on: 04/17/2020 12:00:00 AM, Coverage Ends on: 04/16/2021 12:00:00 AM.

## 2021-01-10 ENCOUNTER — Telehealth: Payer: Self-pay | Admitting: Family Medicine

## 2021-01-10 DIAGNOSIS — N3946 Mixed incontinence: Secondary | ICD-10-CM | POA: Diagnosis not present

## 2021-01-10 DIAGNOSIS — N8110 Cystocele, unspecified: Secondary | ICD-10-CM | POA: Diagnosis not present

## 2021-01-10 NOTE — Telephone Encounter (Signed)
Left message for patient to call back and schedule Medicare Annual Wellness Visit (AWV) in office.   If not able to come in office, please offer to do virtually or by telephone.  Left office number and my jabber [phone].  Last AWV:08/03/2014  Please schedule at anytime with Nurse Health Advisor.

## 2021-01-27 ENCOUNTER — Telehealth: Payer: Self-pay | Admitting: Family Medicine

## 2021-01-27 ENCOUNTER — Other Ambulatory Visit: Payer: Self-pay

## 2021-01-27 DIAGNOSIS — Z1211 Encounter for screening for malignant neoplasm of colon: Secondary | ICD-10-CM

## 2021-01-27 DIAGNOSIS — D171 Benign lipomatous neoplasm of skin and subcutaneous tissue of trunk: Secondary | ICD-10-CM

## 2021-01-27 NOTE — Telephone Encounter (Signed)
Need information regarding lump on back and appointment notes from that appointment. They also need a referral sent in so she can be seen.   [facility]: [phone]

## 2021-01-27 NOTE — Telephone Encounter (Signed)
Ive looked through the pts notes do not see this mentioned. Do you recall discussing this?   Okay to order Cologard?

## 2021-01-27 NOTE — Telephone Encounter (Signed)
Hi Robyn Montes- please print out and fax MRI from 09/08/19

## 2021-01-28 NOTE — Telephone Encounter (Signed)
Faxed

## 2021-01-31 ENCOUNTER — Telehealth: Payer: Self-pay | Admitting: Family Medicine

## 2021-01-31 ENCOUNTER — Other Ambulatory Visit: Payer: Self-pay

## 2021-01-31 NOTE — Telephone Encounter (Signed)
Patient states her referral for back pain was sent to the wrong place. She would like for it be be sent to [facility] Surgeon Fax: [phone]. Please advice.

## 2021-02-03 ENCOUNTER — Telehealth: Payer: Self-pay | Admitting: Family Medicine

## 2021-02-03 DIAGNOSIS — Z1211 Encounter for screening for malignant neoplasm of colon: Secondary | ICD-10-CM

## 2021-02-03 NOTE — Telephone Encounter (Signed)
Pt. Called in and stated she wanted to make sure her cologuard kit was ordered.

## 2021-02-07 NOTE — Telephone Encounter (Signed)
Cologard was ordered the day we spoke on the phone. Will call pt when I have a moment.

## 2021-02-09 DIAGNOSIS — Z1211 Encounter for screening for malignant neoplasm of colon: Secondary | ICD-10-CM | POA: Diagnosis not present

## 2021-02-10 DIAGNOSIS — N8110 Cystocele, unspecified: Secondary | ICD-10-CM | POA: Diagnosis not present

## 2021-02-10 DIAGNOSIS — N3946 Mixed incontinence: Secondary | ICD-10-CM | POA: Diagnosis not present

## 2021-02-11 NOTE — Patient Instructions (Incomplete)
It was good to see you again today, assuming all is well please see me in about 6 months  Please consider getting the shingles vaccine at your pharmacy I would also recommend the latest COVID booster if not done already

## 2021-02-11 NOTE — Progress Notes (Deleted)
[facility] [address] [place], [place] [address] [phone]  Date:  02/14/2021   Name:  Robyn Montes   DOB:  [birth year]   MRN:  [id number]  PCP:  Darreld Mclean, MD    Chief Complaint: No chief complaint on file.   History of Present Illness:  Robyn Montes is a 65 y.o. very pleasant female patient who presents with the following:  Patient seen today for follow-up Most recent visit with myself was in April for physical exam History of prediabetes, hypertension, hypothyroidism, hepatitis C status post curative treatment At her last visit we noted more problems with her memory-I referred her back to neurology (again) but I do not see a note on the chart  Pneumonia vaccine Colon cancer screening- pt had requested a virtual colonoscopy which I ordered in April Shingles vaccine COVID booster Flu vaccine Her Pap is due in December, can update today  Full labs done in April  Synthroid Potassium 10 M EQ's daily Toprol-XL 50 Losartan/HCTZ Metformin Aricept?  If taking Patient Active Problem List   Diagnosis Date Noted   Acne vulgaris 12/17/2020   Prediabetes 05/26/2019   Hepatitis B immune 04/04/2016   Hypothyroidism 10/01/2014   Memory loss 08/21/2014   Chronic hepatitis C ([place]) 08/04/2014   History of other specified conditions presenting hazards to health 08/04/2014   Palpitations 10/03/2013   Essential hypertension 10/03/2013   Biliary calculi 01/17/2013    Past Medical History:  Diagnosis Date   Anemia 1976, 1984, 2001, 2003   history of multiple transfusions (3); iron deficiency   Arthritis    hands, knees B; DDD lumbar   Blood transfusion without reported diagnosis    Family history of adverse reaction to anesthesia    pt's mother and daughter both had significant heart rate drops with anesthesia, believes her mother may have had a mini stroke while under anesthesia   Family  history of heart disease    Heart murmur    Hepatitis C 04/17/2013   acquired from blood transfusion; s/p [facility] in [place].   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   Palpitations    Pre-diabetes    on metformin for prediabetes   Shortness of breath    Sleep apnea    tested over 20 year ago, does not use cpap per patient    Past Surgical History:  Procedure Laterality Date   LIVER BIOPSY      Social History   Tobacco Use   Smoking status: Never   Smokeless tobacco: Never  Vaping Use   Vaping Use: Never used  Substance Use Topics   Alcohol use: No   Drug use: No    Family History  Problem Relation Age of Onset   Heart disease Mother        heart attack and stroke; passed away in her 24s   Diabetes Mother    Hyperlipidemia Mother    Hypertension Mother    Stroke Mother    Heart disease Father        passed away in his 40s   Hyperlipidemia Father    Diabetes Brother    Heart disease Brother    Hypertension Brother    Diabetes Brother    Hypertension Sister    Breast cancer Maternal Aunt     Allergies  Allergen Reactions   Bee Venom Other (See Comments)  Unknown    Medication list has been reviewed and updated.  Current Outpatient Medications on File Prior to Visit  Medication Sig Dispense Refill   Blood Glucose Monitoring Suppl (ACCU-CHEK GUIDE) w/Device KIT CHECK BLOOD SUGAR 3 TIMES A DAY. DX CODE: E11.9 1 kit 0   Brimonidine Tartrate 0.025 % SOLN Place 1-2 drops into both eyes daily as needed (for dry eyes).     clindamycin-benzoyl peroxide (BENZACLIN) gel APPLY TO AFFECTED AREA TWICE A DAY*NOT COVERED* 50 g 6   donepezil (ARICEPT) 10 MG tablet Take 1 tablet (10 mg total) by mouth at bedtime. May increase to 2 pills after 4 weeks 30 tablet 5   EPINEPHrine 0.3 mg/0.3 mL IJ SOAJ injection Inject 0.3 mLs (0.3 mg total) into the skin once as needed for up to 1 dose for anaphylaxis. 2 Device 3   hydroquinone 4 %  cream APPLY TO AFFECTED AREA TOPICALLY DAILY FOR 2-3 WEEKS ONLY 28.35 g 1   Insulin Pen Needle (COMFORT EZ PEN NEEDLES) 32G X 5 MM MISC Use daily for Saxenda injection 100 each 3   losartan-hydrochlorothiazide (HYZAAR) 50-12.5 MG tablet TAKE 1 TABLET BY MOUTH TWICE A DAY 180 tablet 1   lovastatin (MEVACOR) 20 MG tablet Take 1 tablet (20 mg total) by mouth at bedtime. 90 tablet 3   metFORMIN (GLUCOPHAGE) 500 MG tablet Take 1 tablet (500 mg total) by mouth 2 (two) times daily with a meal. 180 tablet 3   metoprolol succinate (TOPROL-XL) 50 MG 24 hr tablet Take 1 tablet (50 mg total) by mouth daily. TAKE WITH OR IMMEDIATELY FOLLOWING A MEAL. 90 tablet 3   Naphazoline HCl (CLEAR EYES OP) Place 2 drops into both eyes daily as needed (for dry eyes).     potassium chloride (K-DUR) 10 MEQ tablet Take 1 tablet (10 mEq total) by mouth daily. 30 tablet 1   SYNTHROID 100 MCG tablet Take 1 tablet (100 mcg total) by mouth daily before breakfast. 90 tablet 3   SYNTHROID 100 MCG tablet TAKE 1 TABLET BY MOUTH DAILY BEFORE BREAKFAST 90 tablet 1   tretinoin (RETIN-A) 0.05 % cream Apply topically at bedtime as needed. 45 g 3   TRUE METRIX BLOOD GLUCOSE TEST test strip TEST BLOOD SUGAR THREE TIMES DAILY 300 strip 1   TRUEplus Lancets 33G MISC TEST BLOOD SUGAR THREE TIMES DAILY 300 each 1   Vitamin D, Ergocalciferol, (DRISDOL) 50000 units CAPS capsule TAKE ONE CAPSULE BY MOUTH ONCE A WEEK 12 capsule 0   No current facility-administered medications on file prior to visit.    Review of Systems:  As per HPI- otherwise negative.   Physical Examination: There were no vitals filed for this visit. There were no vitals filed for this visit. There is no height or weight on file to calculate BMI. Ideal Body Weight:    GEN: no acute distress. HEENT: Atraumatic, Normocephalic.  Ears and Nose: No external deformity. CV: RRR, No M/G/R. No JVD. No thrill. No extra heart sounds. PULM: CTA B, no wheezes, crackles, rhonchi.  No retractions. No resp. distress. No accessory muscle use. ABD: S, NT, ND, +BS. No rebound. No HSM. EXTR: No c/c/e PSYCH: Normally interactive. Conversant.    Assessment and Plan: ***  Signed Lamar Blinks, MD

## 2021-02-14 ENCOUNTER — Ambulatory Visit: Payer: Medicare HMO | Admitting: Family Medicine

## 2021-02-14 DIAGNOSIS — R413 Other amnesia: Secondary | ICD-10-CM

## 2021-02-14 DIAGNOSIS — E038 Other specified hypothyroidism: Secondary | ICD-10-CM

## 2021-02-14 DIAGNOSIS — I10 Essential (primary) hypertension: Secondary | ICD-10-CM

## 2021-02-14 DIAGNOSIS — R7303 Prediabetes: Secondary | ICD-10-CM

## 2021-02-17 DIAGNOSIS — B182 Chronic viral hepatitis C: Secondary | ICD-10-CM | POA: Diagnosis not present

## 2021-02-17 DIAGNOSIS — E039 Hypothyroidism, unspecified: Secondary | ICD-10-CM | POA: Diagnosis not present

## 2021-02-17 DIAGNOSIS — R002 Palpitations: Secondary | ICD-10-CM | POA: Diagnosis not present

## 2021-02-17 DIAGNOSIS — D171 Benign lipomatous neoplasm of skin and subcutaneous tissue of trunk: Secondary | ICD-10-CM | POA: Diagnosis not present

## 2021-02-17 DIAGNOSIS — Z9289 Personal history of other medical treatment: Secondary | ICD-10-CM | POA: Diagnosis not present

## 2021-02-17 DIAGNOSIS — Z8489 Family history of other specified conditions: Secondary | ICD-10-CM | POA: Diagnosis not present

## 2021-02-17 DIAGNOSIS — T8859XA Other complications of anesthesia, initial encounter: Secondary | ICD-10-CM | POA: Diagnosis not present

## 2021-02-17 DIAGNOSIS — Z01818 Encounter for other preprocedural examination: Secondary | ICD-10-CM | POA: Diagnosis not present

## 2021-02-17 DIAGNOSIS — R7303 Prediabetes: Secondary | ICD-10-CM | POA: Diagnosis not present

## 2021-02-17 DIAGNOSIS — I10 Essential (primary) hypertension: Secondary | ICD-10-CM | POA: Diagnosis not present

## 2021-02-17 LAB — COLOGUARD
COLOGUARD: NEGATIVE
Cologuard: NEGATIVE

## 2021-02-24 NOTE — Progress Notes (Deleted)
[place] [facility] [address] [place], [place] [address] [phone]  Date:  03/02/2021   Name:  Robyn Montes   DOB:  10-24-1955   MRN:  [id number]  PCP:  Darreld Mclean, MD    Chief Complaint: No chief complaint on file.   History of Present Illness:  Robyn Montes is a 65 y.o. very pleasant female patient who presents with the following:  Seen today for follow-up - History of prediabetes, hypertension, hypothyroidism, hepatitis C status post curative treatment  Last seen by myself April of this year   I had referred her to see neurology for memory loss- however pt declined to be scheduled when they called   Pneumonia vaccine Colon cancer screening Covid booster Flu  Pap due next month  Labs done in April  Patient Active Problem List   Diagnosis Date Noted   Acne vulgaris 12/17/2020   Prediabetes 05/26/2019   Hepatitis B immune 04/04/2016   Hypothyroidism 10/01/2014   Memory loss 08/21/2014   Chronic hepatitis C ([place]) 08/04/2014   History of other specified conditions presenting hazards to health 08/04/2014   Palpitations 10/03/2013   Essential hypertension 10/03/2013   Biliary calculi 01/17/2013    Past Medical History:  Diagnosis Date   Anemia 1976, 1984, 2001, 2003   history of multiple transfusions (3); iron deficiency   Arthritis    hands, knees B; DDD lumbar   Blood transfusion without reported diagnosis    Family history of adverse reaction to anesthesia    pt's mother and daughter both had significant heart rate drops with anesthesia, believes her mother may have had a mini stroke while under anesthesia   Family history of heart disease    Heart murmur    Hepatitis C 04/17/2013   acquired from blood transfusion; s/p [facility] treatment [facility] in [place].   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   Palpitations    Pre-diabetes    on metformin for  prediabetes   Shortness of breath    Sleep apnea    tested over 20 year ago, does not use cpap per patient    Past Surgical History:  Procedure Laterality Date   LIVER BIOPSY      Social History   Tobacco Use   Smoking status: Never   Smokeless tobacco: Never  Vaping Use   Vaping Use: Never used  Substance Use Topics   Alcohol use: No   Drug use: No    Family History  Problem Relation Age of Onset   Heart disease Mother        heart attack and stroke; passed away in her 77s   Diabetes Mother    Hyperlipidemia Mother    Hypertension Mother    Stroke Mother    Heart disease Father        passed away in his 28s   Hyperlipidemia Father    Diabetes Brother    Heart disease Brother    Hypertension Brother    Diabetes Brother    Hypertension Sister    Breast cancer Maternal Aunt     Allergies  Allergen Reactions   Bee Venom Other (See Comments)    Unknown    Medication list has been reviewed and updated.  Current Outpatient Medications on File Prior to Visit  Medication Sig Dispense Refill   Blood Glucose Monitoring Suppl (ACCU-CHEK GUIDE) w/Device KIT CHECK BLOOD  SUGAR 3 TIMES A DAY. DX CODE: E11.9 1 kit 0   Brimonidine Tartrate 0.025 % SOLN Place 1-2 drops into both eyes daily as needed (for dry eyes).     clindamycin-benzoyl peroxide (BENZACLIN) gel APPLY TO AFFECTED AREA TWICE A DAY*NOT COVERED* 50 g 6   donepezil (ARICEPT) 10 MG tablet Take 1 tablet (10 mg total) by mouth at bedtime. May increase to 2 pills after 4 weeks 30 tablet 5   EPINEPHrine 0.3 mg/0.3 mL IJ SOAJ injection Inject 0.3 mLs (0.3 mg total) into the skin once as needed for up to 1 dose for anaphylaxis. 2 Device 3   hydroquinone 4 % cream APPLY TO AFFECTED AREA TOPICALLY DAILY FOR 2-3 WEEKS ONLY 28.35 g 1   Insulin Pen Needle (COMFORT EZ PEN NEEDLES) 32G X 5 MM MISC Use daily for Saxenda injection 100 each 3   losartan-hydrochlorothiazide (HYZAAR) 50-12.5 MG tablet TAKE 1 TABLET BY MOUTH TWICE  A DAY 180 tablet 1   lovastatin (MEVACOR) 20 MG tablet Take 1 tablet (20 mg total) by mouth at bedtime. 90 tablet 3   metFORMIN (GLUCOPHAGE) 500 MG tablet Take 1 tablet (500 mg total) by mouth 2 (two) times daily with a meal. 180 tablet 3   metoprolol succinate (TOPROL-XL) 50 MG 24 hr tablet Take 1 tablet (50 mg total) by mouth daily. TAKE WITH OR IMMEDIATELY FOLLOWING A MEAL. 90 tablet 3   Naphazoline HCl (CLEAR EYES OP) Place 2 drops into both eyes daily as needed (for dry eyes).     potassium chloride (K-DUR) 10 MEQ tablet Take 1 tablet (10 mEq total) by mouth daily. 30 tablet 1   SYNTHROID 100 MCG tablet Take 1 tablet (100 mcg total) by mouth daily before breakfast. 90 tablet 3   SYNTHROID 100 MCG tablet TAKE 1 TABLET BY MOUTH DAILY BEFORE BREAKFAST 90 tablet 1   tretinoin (RETIN-A) 0.05 % cream Apply topically at bedtime as needed. 45 g 3   TRUE METRIX BLOOD GLUCOSE TEST test strip TEST BLOOD SUGAR THREE TIMES DAILY 300 strip 1   TRUEplus Lancets 33G MISC TEST BLOOD SUGAR THREE TIMES DAILY 300 each 1   Vitamin D, Ergocalciferol, (DRISDOL) 50000 units CAPS capsule TAKE ONE CAPSULE BY MOUTH ONCE A WEEK 12 capsule 0   No current facility-administered medications on file prior to visit.    Review of Systems:  As per HPI- otherwise negative.   Physical Examination: There were no vitals filed for this visit. There were no vitals filed for this visit. There is no height or weight on file to calculate BMI. Ideal Body Weight:    GEN: no acute distress. HEENT: Atraumatic, Normocephalic.  Ears and Nose: No external deformity. CV: RRR, No M/G/R. No JVD. No thrill. No extra heart sounds. PULM: CTA B, no wheezes, crackles, rhonchi. No retractions. No resp. distress. No accessory muscle use. ABD: S, NT, ND, +BS. No rebound. No HSM. EXTR: No c/c/e PSYCH: Normally interactive. Conversant.    Assessment and Plan: ***  Signed Lamar Blinks, MD

## 2021-03-02 ENCOUNTER — Other Ambulatory Visit: Payer: Self-pay | Admitting: Family Medicine

## 2021-03-02 ENCOUNTER — Ambulatory Visit: Payer: Medicare HMO | Admitting: Family Medicine

## 2021-03-04 ENCOUNTER — Other Ambulatory Visit: Payer: Self-pay | Admitting: Family Medicine

## 2021-03-04 DIAGNOSIS — L578 Other skin changes due to chronic exposure to nonionizing radiation: Secondary | ICD-10-CM

## 2021-03-08 NOTE — Progress Notes (Deleted)
[place] at [facility] [address] [place], [place] [address] [phone]  Date:  03/09/2021   Name:  Robyn Montes   DOB:  11-02-1955   MRN:  [id number]  PCP:  Darreld Mclean, MD    Chief Complaint: No chief complaint on file.   History of Present Illness:  Robyn Montes is a 65 y.o. very pleasant female patient who presents with the following:  Pt seen today virtually for a follow-up appt Last seen by myself in April History of prediabetes, hypertension, hypothyroidism, hepatitis C status post curative treatment, memory loss  She has been concerned about a lipoma on her back and had a consult with general surgery- [facility]- regarding this on 11/3  Flu shot Covid booster I ordered a CT virtual colonoscopy per her request in April- this referral expired as she never followed up I also referred her to neurology again in April for her memory loss- she declined to schedule   Lab Results  Component Value Date   TSH 0.70 07/29/2020      Patient Active Problem List   Diagnosis Date Noted   Acne vulgaris 12/17/2020   Prediabetes 05/26/2019   Hepatitis B immune 04/04/2016   Hypothyroidism 10/01/2014   Memory loss 08/21/2014   Chronic hepatitis C ([place]) 08/04/2014   History of other specified conditions presenting hazards to health 08/04/2014   Palpitations 10/03/2013   Essential hypertension 10/03/2013   Biliary calculi 01/17/2013    Past Medical History:  Diagnosis Date   Anemia 1976, 1984, 2001, 2003   history of multiple transfusions (3); iron deficiency   Arthritis    hands, knees B; DDD lumbar   Blood transfusion without reported diagnosis    Family history of adverse reaction to anesthesia    pt's mother and daughter both had significant heart rate drops with anesthesia, believes her mother may have had a mini stroke while under anesthesia   Family history of heart disease    Heart murmur     Hepatitis C 04/17/2013   acquired from blood transfusion; s/p [facility] treatment [facility] in [place].   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   Palpitations    Pre-diabetes    on metformin for prediabetes   Shortness of breath    Sleep apnea    tested over 20 year ago, does not use cpap per patient    Past Surgical History:  Procedure Laterality Date   LIVER BIOPSY      Social History   Tobacco Use   Smoking status: Never   Smokeless tobacco: Never  Vaping Use   Vaping Use: Never used  Substance Use Topics   Alcohol use: No   Drug use: No    Family History  Problem Relation Age of Onset   Heart disease Mother        heart attack and stroke; passed away in her 74s   Diabetes Mother    Hyperlipidemia Mother    Hypertension Mother    Stroke Mother    Heart disease Father        passed away in his 5s   Hyperlipidemia Father    Diabetes Brother    Heart disease Brother    Hypertension Brother    Diabetes Brother    Hypertension Sister    Breast cancer Maternal Aunt     Allergies  Allergen Reactions   Bee Venom Other (  See Comments)    Unknown    Medication list has been reviewed and updated.  Current Outpatient Medications on File Prior to Visit  Medication Sig Dispense Refill   Accu-Chek Softclix Lancets lancets USE TO CHECK BLOOD SUGAR 3 TIMES A DAY 300 each 1   Blood Glucose Monitoring Suppl (ACCU-CHEK GUIDE) w/Device KIT CHECK BLOOD SUGAR 3 TIMES A DAY. DX CODE: E11.9 1 kit 0   Brimonidine Tartrate 0.025 % SOLN Place 1-2 drops into both eyes daily as needed (for dry eyes).     clindamycin-benzoyl peroxide (BENZACLIN) gel APPLY TO AFFECTED AREA TWICE A DAY*NOT COVERED* 50 g 6   donepezil (ARICEPT) 10 MG tablet Take 1 tablet (10 mg total) by mouth at bedtime. May increase to 2 pills after 4 weeks 30 tablet 5   EPINEPHrine 0.3 mg/0.3 mL IJ SOAJ injection Inject 0.3 mLs (0.3 mg total) into the skin once as needed for up to 1 dose for  anaphylaxis. 2 Device 3   hydroquinone 4 % cream APPLY TO AFFECTED AREA TOPICALLY DAILY FOR 2-3 WEEKS ONLY 28.35 g 1   Insulin Pen Needle (COMFORT EZ PEN NEEDLES) 32G X 5 MM MISC Use daily for Saxenda injection 100 each 3   losartan-hydrochlorothiazide (HYZAAR) 50-12.5 MG tablet TAKE 1 TABLET BY MOUTH TWICE A DAY 180 tablet 1   lovastatin (MEVACOR) 20 MG tablet Take 1 tablet (20 mg total) by mouth at bedtime. 90 tablet 3   metFORMIN (GLUCOPHAGE) 500 MG tablet Take 1 tablet (500 mg total) by mouth 2 (two) times daily with a meal. 180 tablet 3   metoprolol succinate (TOPROL-XL) 50 MG 24 hr tablet Take 1 tablet (50 mg total) by mouth daily. TAKE WITH OR IMMEDIATELY FOLLOWING A MEAL. 90 tablet 3   Naphazoline HCl (CLEAR EYES OP) Place 2 drops into both eyes daily as needed (for dry eyes).     potassium chloride (K-DUR) 10 MEQ tablet Take 1 tablet (10 mEq total) by mouth daily. 30 tablet 1   SYNTHROID 100 MCG tablet Take 1 tablet (100 mcg total) by mouth daily before breakfast. 90 tablet 3   SYNTHROID 100 MCG tablet TAKE 1 TABLET BY MOUTH DAILY BEFORE BREAKFAST 90 tablet 1   tretinoin (RETIN-A) 0.05 % cream APPLY TOPICALLY AT BEDTIME AS NEEDED. 45 g 1   TRUE METRIX BLOOD GLUCOSE TEST test strip TEST BLOOD SUGAR THREE TIMES DAILY 300 strip 1   Vitamin D, Ergocalciferol, (DRISDOL) 50000 units CAPS capsule TAKE ONE CAPSULE BY MOUTH ONCE A WEEK 12 capsule 0   No current facility-administered medications on file prior to visit.    Review of Systems:  As per HPI- otherwise negative.   Physical Examination: There were no vitals filed for this visit. There were no vitals filed for this visit. There is no height or weight on file to calculate BMI. Ideal Body Weight:    ***  Assessment and Plan: ***  Signed Lamar Blinks, MD

## 2021-03-09 ENCOUNTER — Other Ambulatory Visit: Payer: Self-pay

## 2021-03-09 ENCOUNTER — Telehealth: Payer: Medicare HMO | Admitting: Family Medicine

## 2021-03-17 ENCOUNTER — Other Ambulatory Visit: Payer: Self-pay | Admitting: Family Medicine

## 2021-03-17 DIAGNOSIS — E038 Other specified hypothyroidism: Secondary | ICD-10-CM

## 2021-03-18 DIAGNOSIS — Z03818 Encounter for observation for suspected exposure to other biological agents ruled out: Secondary | ICD-10-CM | POA: Diagnosis not present

## 2021-03-18 DIAGNOSIS — Z20822 Contact with and (suspected) exposure to covid-19: Secondary | ICD-10-CM | POA: Diagnosis not present

## 2021-03-18 DIAGNOSIS — E119 Type 2 diabetes mellitus without complications: Secondary | ICD-10-CM | POA: Diagnosis not present

## 2021-03-31 ENCOUNTER — Other Ambulatory Visit: Payer: Self-pay | Admitting: Family Medicine

## 2021-04-14 DIAGNOSIS — N3946 Mixed incontinence: Secondary | ICD-10-CM | POA: Diagnosis not present

## 2021-04-14 DIAGNOSIS — N8110 Cystocele, unspecified: Secondary | ICD-10-CM | POA: Diagnosis not present

## 2021-05-10 ENCOUNTER — Telehealth: Payer: Self-pay

## 2021-05-10 ENCOUNTER — Other Ambulatory Visit: Payer: Self-pay | Admitting: Family Medicine

## 2021-05-10 DIAGNOSIS — L578 Other skin changes due to chronic exposure to nonionizing radiation: Secondary | ICD-10-CM

## 2021-05-10 DIAGNOSIS — E785 Hyperlipidemia, unspecified: Secondary | ICD-10-CM

## 2021-05-10 MED ORDER — TRETINOIN 0.05 % EX CREA: TOPICAL_CREAM | Freq: Every evening | CUTANEOUS | 1 refills | Status: DC | PRN

## 2021-05-10 NOTE — Telephone Encounter (Signed)
Pt called to schedule a VV with PCP and requested a refill on tretinoin 0.05% cream and stated it will need a PA completed.  Pharmacy is [facility] on [address].

## 2021-05-10 NOTE — Telephone Encounter (Signed)
I refilled it- can start Prior auth

## 2021-05-10 NOTE — Telephone Encounter (Signed)
Last OV was 07/29/20- since VV is now pending is this okay to fill? Or wait?

## 2021-05-11 NOTE — Telephone Encounter (Signed)
PA started:    Key: [id number] - PA Case ID: [id number]

## 2021-05-11 NOTE — Telephone Encounter (Signed)
Approved today PA Case: [id number], Status: Approved, Coverage Starts on: 04/17/2021 12:00:00 AM, Coverage Ends on: 04/16/2022 12:00:00 AM. Questions? Contact [phone].

## 2021-05-16 DIAGNOSIS — N3946 Mixed incontinence: Secondary | ICD-10-CM | POA: Diagnosis not present

## 2021-05-16 DIAGNOSIS — N8110 Cystocele, unspecified: Secondary | ICD-10-CM | POA: Diagnosis not present

## 2021-05-21 NOTE — Progress Notes (Addendum)
[facility] [address] [place], [place] [address] [phone]  Date:  05/23/2021   Name:  Robyn Montes   DOB:  [birth year]   MRN:  [id number]  PCP:  Darreld Mclean, MD    Chief Complaint: No chief complaint on file.   History of Present Illness:  Robyn Montes is a 66 y.o. very pleasant female patient who presents with the following:  Virtual visit today.  Patient location is home, my location is office Patient identity confirmed with 2 factors, she gives consent for virtual visit today.  The patient and myself are present on the call Patient following up today on medications- History of prediabetes, hypertension, hypothyroidism, hepatitis C status post curative treatment, memory loss  Most recent visit with myself was in April  Flu vaccine Pap smear- recommended to do at her convenience  I ordered her virtual colonoscopy per her request in April 2022-not yet completed or scheduled However pt states she did cologuard instead - I did not get this result, I will check on it for her  Indeed-  patient had a negative Cologuard 02/09/2021 Mammo UTD  She has noticed a possible lymph node in her arm- not in the axilla She is not sure what this means  I advised her I would need to see and examine her to help with this issue.  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 has been evaluated by surgery- this is being rescheduled as she was sick at time of planned surgery  She would like to have her labs done at the [facility] location  Patient Active Problem List   Diagnosis Date Noted   Acne vulgaris 12/17/2020   Prediabetes 05/26/2019   Hepatitis B immune 04/04/2016   Hypothyroidism 10/01/2014   Memory loss 08/21/2014   Chronic hepatitis C ([place]) 08/04/2014   History of other specified conditions presenting hazards to health 08/04/2014    Palpitations 10/03/2013   Essential hypertension 10/03/2013   Biliary calculi 01/17/2013    Past Medical History:  Diagnosis Date   Anemia 1976, 1984, 2001, 2003   history of multiple transfusions (3); iron deficiency   Arthritis    hands, knees B; DDD lumbar   Blood transfusion without reported diagnosis    Family history of adverse reaction to anesthesia    pt's mother and daughter both had significant heart rate drops with anesthesia, believes her mother may have had a mini stroke while under anesthesia   Family history of heart disease    Heart murmur    Hepatitis C 04/17/2013   acquired from blood transfusion; s/p [facility] in [place].   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   Palpitations    Pre-diabetes    on metformin for prediabetes   Shortness of breath    Sleep apnea    tested over 20 year ago, does not use cpap per patient    Past Surgical History:  Procedure Laterality Date   LIVER BIOPSY      Social History   Tobacco Use   Smoking status: Never   Smokeless tobacco: Never  Vaping Use   Vaping Use: Never used  Substance Use Topics   Alcohol use: No   Drug use: No    Family History  Problem Relation  Age of Onset   Heart disease Mother        heart attack and stroke; passed away in her 23s   Diabetes Mother    Hyperlipidemia Mother    Hypertension Mother    Stroke Mother    Heart disease Father        passed away in his 28s   Hyperlipidemia Father    Diabetes Brother    Heart disease Brother    Hypertension Brother    Diabetes Brother    Hypertension Sister    Breast cancer Maternal Aunt     Allergies  Allergen Reactions   Bee Venom Other (See Comments)    Unknown    Medication list has been reviewed and updated.  Current Outpatient Medications on File Prior to Visit  Medication Sig Dispense Refill   ACCU-CHEK GUIDE test strip USE TO CHECK BLOOD SUGAR 3 TIMES A DAY. DX CODE: E11.9 300 strip 1    Accu-Chek Softclix Lancets lancets USE TO CHECK BLOOD SUGAR 3 TIMES A DAY 300 each 1   Blood Glucose Monitoring Suppl (ACCU-CHEK GUIDE) w/Device KIT CHECK BLOOD SUGAR 3 TIMES A DAY. DX CODE: E11.9 1 kit 0   Brimonidine Tartrate 0.025 % SOLN Place 1-2 drops into both eyes daily as needed (for dry eyes).     clindamycin-benzoyl peroxide (BENZACLIN) gel APPLY TO AFFECTED AREA TWICE A DAY*NOT COVERED* 50 g 6   donepezil (ARICEPT) 10 MG tablet Take 1 tablet (10 mg total) by mouth at bedtime. May increase to 2 pills after 4 weeks 30 tablet 5   EPINEPHrine 0.3 mg/0.3 mL IJ SOAJ injection Inject 0.3 mLs (0.3 mg total) into the skin once as needed for up to 1 dose for anaphylaxis. 2 Device 3   hydroquinone 4 % cream APPLY TO AFFECTED AREA TOPICALLY DAILY FOR 2-3 WEEKS ONLY 28.35 g 1   Insulin Pen Needle (COMFORT EZ PEN NEEDLES) 32G X 5 MM MISC Use daily for Saxenda injection 100 each 3   losartan-hydrochlorothiazide (HYZAAR) 50-12.5 MG tablet TAKE 1 TABLET BY MOUTH TWICE A DAY 180 tablet 1   lovastatin (MEVACOR) 20 MG tablet TAKE 1 TABLET BY MOUTH EVERYDAY AT BEDTIME 90 tablet 1   metFORMIN (GLUCOPHAGE) 500 MG tablet Take 1 tablet (500 mg total) by mouth 2 (two) times daily with a meal. 180 tablet 3   metoprolol succinate (TOPROL-XL) 50 MG 24 hr tablet Take 1 tablet (50 mg total) by mouth daily. TAKE WITH OR IMMEDIATELY FOLLOWING A MEAL. 90 tablet 3   Naphazoline HCl (CLEAR EYES OP) Place 2 drops into both eyes daily as needed (for dry eyes).     potassium chloride (K-DUR) 10 MEQ tablet Take 1 tablet (10 mEq total) by mouth daily. 30 tablet 1   SYNTHROID 100 MCG tablet Take 1 tablet (100 mcg total) by mouth daily before breakfast. 90 tablet 3   SYNTHROID 100 MCG tablet TAKE 1 TABLET BY MOUTH EVERY DAY BEFORE BREAKFAST 90 tablet 1   tretinoin (RETIN-A) 0.05 % cream Apply topically at bedtime as needed. 45 g 1   Vitamin D, Ergocalciferol, (DRISDOL) 50000 units CAPS capsule TAKE ONE CAPSULE BY MOUTH ONCE A  WEEK 12 capsule 0   No current facility-administered medications on file prior to visit.    Review of Systems:  As per HPI- otherwise negative.   Physical Examination: There were no vitals filed for this visit. There were no vitals filed for this visit. There is no height or weight on file  to calculate BMI. Ideal Body Weight:    Pt observed via video monitor  She looks well, her normal self.  No shortness of breath or distress is noted  Assessment and Plan: Prediabetes - Plan: Comprehensive metabolic panel, Hemoglobin A1c  Other specified hypothyroidism - Plan: TSH  Essential hypertension - Plan: CBC, Comprehensive metabolic panel  Vitamin D deficiency - Plan: VITAMIN D 25 Hydroxy (Vit-D Deficiency, Fractures)  Screening for hyperlipidemia - Plan: Lipid panel  Photoaging of skin - Plan: tretinoin (RETIN-A) 0.05 % cream  Rosacea - Plan: clindamycin-benzoyl peroxide (BENZACLIN) gel  Patient seen today for follow-up visit, video used for duration of visit today She is requesting routine labs be ordered for her to have drawn outside clinic.  Orders are placed as above- Will plan further follow- up pending labs.  I also ordered her 2 creams that she uses for rosacea and photoaging as above  Patient states she had Cologuard performed, we will look for this result  I did aspirate come in soon for her Pap smear  Signed Lamar Blinks, MD

## 2021-05-23 ENCOUNTER — Other Ambulatory Visit: Payer: Self-pay | Admitting: Family Medicine

## 2021-05-23 ENCOUNTER — Telehealth (INDEPENDENT_AMBULATORY_CARE_PROVIDER_SITE_OTHER): Payer: Medicare HMO | Admitting: Family Medicine

## 2021-05-23 DIAGNOSIS — E559 Vitamin D deficiency, unspecified: Secondary | ICD-10-CM | POA: Diagnosis not present

## 2021-05-23 DIAGNOSIS — Z13220 Encounter for screening for lipoid disorders: Secondary | ICD-10-CM

## 2021-05-23 DIAGNOSIS — R7303 Prediabetes: Secondary | ICD-10-CM

## 2021-05-23 DIAGNOSIS — L719 Rosacea, unspecified: Secondary | ICD-10-CM

## 2021-05-23 DIAGNOSIS — E038 Other specified hypothyroidism: Secondary | ICD-10-CM | POA: Diagnosis not present

## 2021-05-23 DIAGNOSIS — I10 Essential (primary) hypertension: Secondary | ICD-10-CM | POA: Diagnosis not present

## 2021-05-23 DIAGNOSIS — L578 Other skin changes due to chronic exposure to nonionizing radiation: Secondary | ICD-10-CM | POA: Diagnosis not present

## 2021-05-23 MED ORDER — CLINDAMYCIN PHOS-BENZOYL PEROX 1-5 % EX GEL: CUTANEOUS | 6 refills | Status: DC

## 2021-05-23 MED ORDER — TRETINOIN 0.05 % EX CREA: TOPICAL_CREAM | Freq: Every evening | CUTANEOUS | 1 refills | Status: DC | PRN

## 2021-05-23 NOTE — Telephone Encounter (Signed)
Retin-a is not covered by insurance. Preferred alternatives:  adapalene (DIFFERIN) 0.1 % gel erythromycin with ethanol (THERAMYCIN) 2 % external solution clindamycin (CLEOCIN T) 1 % SWAB

## 2021-05-24 ENCOUNTER — Encounter: Payer: Self-pay | Admitting: Family Medicine

## 2021-05-24 ENCOUNTER — Other Ambulatory Visit: Payer: Self-pay

## 2021-05-24 ENCOUNTER — Telehealth: Payer: Self-pay | Admitting: Family Medicine

## 2021-05-24 DIAGNOSIS — L578 Other skin changes due to chronic exposure to nonionizing radiation: Secondary | ICD-10-CM

## 2021-05-24 NOTE — Telephone Encounter (Signed)
Medication:  hydroquinone 4 % cream [[id number]]     Has the patient contacted their pharmacy? No. (If no, request that the patient contact the pharmacy for the refill.) (If yes, when and what did the pharmacy advise?)     Preferred Pharmacy (with phone number or street name):  [address] [place] [address] [phone]    Agent: Please be advised that RX refills may take up to 3 business days. We ask that you follow-up with your pharmacy.

## 2021-05-24 NOTE — Telephone Encounter (Signed)
Okay for this refill since it is a bleaching agent?

## 2021-05-25 NOTE — Telephone Encounter (Signed)
Pt stated this medication was for breakouts that caused dark spots.    [place], [place], [place] [address] [phone]

## 2021-05-26 MED ORDER — HYDROQUINONE 4 % EX CREA: TOPICAL_CREAM | CUTANEOUS | 1 refills | Status: DC

## 2021-06-07 ENCOUNTER — Ambulatory Visit (INDEPENDENT_AMBULATORY_CARE_PROVIDER_SITE_OTHER): Payer: Medicare HMO

## 2021-06-07 ENCOUNTER — Ambulatory Visit: Payer: Medicare HMO

## 2021-06-07 VITALS — Ht 64.5 in | Wt 209.0 lb

## 2021-06-07 DIAGNOSIS — Z0000 Encounter for general adult medical examination without abnormal findings: Secondary | ICD-10-CM

## 2021-06-07 NOTE — Progress Notes (Signed)
Subjective:   Robyn Montes is a 66 y.o. female who presents for Medicare Annual (Subsequent) preventive examination.  I connected with Robyn Montes today by telephone and verified that I am speaking with the correct person using two identifiers. Location patient: home Location provider: work Persons participating in the virtual visit: patient, Robyn Montes.    I discussed the limitations, risks, security and privacy concerns of performing an evaluation and management service by telephone and the availability of in person appointments. I also discussed with the patient that there may be a patient responsible charge related to this service. The patient expressed understanding and verbally consented to this telephonic visit.    Interactive audio and video telecommunications were attempted between this provider and patient, however failed, due to patient having technical difficulties OR patient did not have access to video capability.  We continued and completed visit with audio only.  Some vital signs may be absent or patient reported.   Time Spent with patient on telephone encounter: 20 minutes   Review of Systems     Cardiac Risk Factors include: advanced age (>51mn, >>59women);dyslipidemia;hypertension;obesity (BMI >30kg/m2)     Objective:    Today's Vitals   06/07/21 1423  Weight: 209 lb (94.8 kg)  Height: 5' 4.5" (1.638 m)   Body mass index is 35.32 kg/m.  Advanced Directives 06/07/2021 04/25/2018 02/12/2018 02/11/2018  Does Patient Have a Medical Advance Directive? No No No No  Would patient like information on creating a medical advance directive? Yes (MAU/Ambulatory/Procedural Areas - Information given) No - Patient declined No - Patient declined -    Current Medications (verified) Outpatient Encounter Medications as of 06/07/2021  Medication Sig   ACCU-CHEK GUIDE test strip USE TO CHECK BLOOD SUGAR 3 TIMES A DAY. DX CODE: E11.9   Accu-Chek Softclix Lancets lancets USE TO  CHECK BLOOD SUGAR 3 TIMES A DAY   Blood Glucose Monitoring Suppl (ACCU-CHEK GUIDE) w/Device KIT CHECK BLOOD SUGAR 3 TIMES A DAY. DX CODE: E11.9   hydroquinone 4 % cream APPLY TO AFFECTED AREA TOPICALLY DAILY FOR 2-3 WEEKS ONLY   losartan-hydrochlorothiazide (HYZAAR) 50-12.5 MG tablet TAKE 1 TABLET BY MOUTH TWICE A DAY   metFORMIN (GLUCOPHAGE) 500 MG tablet Take 1 tablet (500 mg total) by mouth 2 (two) times daily with a meal.   metoprolol succinate (TOPROL-XL) 50 MG 24 hr tablet Take 1 tablet (50 mg total) by mouth daily. TAKE WITH OR IMMEDIATELY FOLLOWING A MEAL.   Naphazoline HCl (CLEAR EYES OP) Place 2 drops into both eyes daily as needed (for dry eyes).   SYNTHROID 100 MCG tablet TAKE 1 TABLET BY MOUTH EVERY DAY BEFORE BREAKFAST   tretinoin (RETIN-A) 0.05 % cream Apply topically at bedtime as needed.   Vitamin D, Ergocalciferol, (DRISDOL) 50000 units CAPS capsule TAKE ONE CAPSULE BY MOUTH ONCE A WEEK   adapalene (DIFFERIN) 0.1 % gel Apply daily as needed (Patient not taking: Reported on 06/07/2021)   Brimonidine Tartrate 0.025 % SOLN Place 1-2 drops into both eyes daily as needed (for dry eyes).   donepezil (ARICEPT) 10 MG tablet Take 1 tablet (10 mg total) by mouth at bedtime. May increase to 2 pills after 4 weeks (Patient not taking: Reported on 06/07/2021)   EPINEPHrine 0.3 mg/0.3 mL IJ SOAJ injection Inject 0.3 mLs (0.3 mg total) into the skin once as needed for up to 1 dose for anaphylaxis. (Patient not taking: Reported on 06/07/2021)   Insulin Pen Needle (COMFORT EZ PEN NEEDLES) 32G X 5 MM  MISC Use daily for Saxenda injection (Patient not taking: Reported on 06/07/2021)   lovastatin (MEVACOR) 20 MG tablet TAKE 1 TABLET BY MOUTH EVERYDAY AT BEDTIME (Patient not taking: Reported on 06/07/2021)   potassium chloride (K-DUR) 10 MEQ tablet Take 1 tablet (10 mEq total) by mouth daily. (Patient not taking: Reported on 06/07/2021)   No facility-administered encounter medications on file as of 06/07/2021.     Allergies (verified) Bee venom   History: Past Medical History:  Diagnosis Date   Anemia 1976, 1984, 2001, 2003   history of multiple transfusions (3); iron deficiency   Arthritis    hands, knees B; DDD lumbar   Blood transfusion without reported diagnosis    Family history of adverse reaction to anesthesia    pt's mother and daughter both had significant heart rate drops with anesthesia, believes her mother may have had a mini stroke while under anesthesia   Family history of heart disease    Heart murmur    Hepatitis C 04/17/2013   acquired from blood transfusion; s/p [facility] in [place].   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   Palpitations    Pre-diabetes    on metformin for prediabetes   Shortness of breath    Sleep apnea    tested over 20 year ago, does not use cpap per patient   Past Surgical History:  Procedure Laterality Date   LIVER BIOPSY     Family History  Problem Relation Age of Onset   Heart disease Mother        heart attack and stroke; passed away in her 100s   Diabetes Mother    Hyperlipidemia Mother    Hypertension Mother    Stroke Mother    Heart disease Father        passed away in his 76s   Hyperlipidemia Father    Diabetes Brother    Heart disease Brother    Hypertension Brother    Diabetes Brother    Hypertension Sister    Breast cancer Maternal Aunt    Social History   Socioeconomic History   Marital status: Divorced    Spouse name: Not on file   Number of children: Not on file   Years of education: Not on file   Highest education level: Not on file  Occupational History   Not on file  Tobacco Use   Smoking status: Never   Smokeless tobacco: Never  Vaping Use   Vaping Use: Never used  Substance and Sexual Activity   Alcohol use: No   Drug use: No   Sexual activity: Not on file  Other Topics Concern   Not on file  Social History Narrative   Marital status: divorced since 2012  after 74 years of marriage; not dating.  Still with ex-husband.      Children:  3 children; 2 grandchildren.      Lives: with daughter Robyn Montes)      Employment:  Disability for "all little bit of everything"; chronic anemia, DDD lumbar, memory loss, thyroid.  Previous Research Montes (life sciences).      Tobacco: none      Alcohol: none      Drugs: none      Exercise: walking twice daily for 30 minutes each time.      Sexual activity: same partner/husband.        ADLs: independent with ADLs; drives.         Social Determinants of  Health   Financial Resource Strain: Low Risk    Difficulty of Paying Living Expenses: Not hard at all  Food Insecurity: No Food Insecurity   Worried About [place] in the Last Year: Never true   Ran Out of Food in the Last Year: Never true  Transportation Needs: No Transportation Needs   Lack of Transportation (Medical): No   Lack of Transportation (Non-Medical): No  Physical Activity: Insufficiently Active   Days of Exercise per Week: 7 days   Minutes of Exercise per Session: 20 min  Stress: No Stress Concern Present   Feeling of Stress : Not at all  Social Connections: Socially Isolated   Frequency of Communication with Friends and Family: More than three times a week   Frequency of Social Gatherings with Friends and Family: Never   Attends Religious Services: Never   Robyn Montes or Organizations: No   Attends Music therapist: Never   Marital Status: Divorced    Tobacco Counseling Counseling given: Not Answered   Clinical Intake:  Pre-visit preparation completed: Yes  Pain : No/denies pain     BMI - recorded: 35.52 Nutritional Status: BMI > 30  Obese Nutritional Risks: None Diabetes: No  How often do you need to have someone help you when you read instructions, pamphlets, or other written materials from your doctor or pharmacy?: 1 - Never  Diabetic?No  Interpreter Needed?: No  Information entered by :: Caroleen Hamman LPN   Activities of Daily Living In your present state of health, do you have any difficulty performing the following activities: 06/07/2021  Hearing? N  Vision? N  Difficulty concentrating or making decisions? Y  Walking or climbing stairs? N  Dressing or bathing? N  Doing errands, shopping? N  Preparing Food and eating ? N  Using the Toilet? N  In the past six months, have you accidently leaked urine? Y  Comment pt states she has had for years  Do you have problems with loss of bowel control? N  Managing your Medications? N  Managing your Finances? N  Housekeeping or managing your Housekeeping? N  Some recent data might be hidden    Patient Care Team: Copland, Gay Filler, MD as PCP - General (Family Medicine)  Indicate any recent Medical Services you may have received from other than [facility] providers in the past year (date may be approximate).     Assessment:   This is a routine wellness examination for Leonda.  Hearing/Vision screen Hearing Screening - Comments:: C/o mild hearing loss Vision Screening - Comments:: Last eye exam-3 months -[facility]  Dietary issues and exercise activities discussed: Current Exercise Habits: Home exercise routine, Type of exercise: stretching;walking, Time (Minutes): 15, Frequency (Times/Week): 7, Weekly Exercise (Minutes/Week): 105, Intensity: Mild, Exercise limited by: None identified   Goals Addressed             This Visit's Progress    Patient Stated       Increase activity & drink more water       Depression Screen PHQ 2/9 Scores 06/07/2021 01/18/2017 05/19/2015 12/22/2014 10/01/2014 08/03/2014  PHQ - 2 Score 1 0 5 0 0 0  PHQ- 9 Score - - 21 - - -    Fall Risk Fall Risk  06/07/2021 01/18/2017 05/19/2015  Falls in the past year? 0 No No  Number falls in past yr: 0 - -  Injury with Fall? 0 - -  Follow up Falls prevention discussed - -  FALL RISK PREVENTION PERTAINING TO THE HOME:  Any stairs in or around the home? Yes   If so, are there any without handrails? No  Home free of loose throw rugs in walkways, pet beds, electrical cords, etc? Yes  Adequate lighting in your home to reduce risk of falls? Yes   ASSISTIVE DEVICES UTILIZED TO PREVENT FALLS:  Life alert? No  Use of a cane, walker or w/c? No  Grab bars in the bathroom? No  Shower chair or bench in shower? No  Elevated toilet seat or a handicapped toilet? No   TIMED UP AND GO:  Was the test performed? No .    Cognitive Function:Normal cognitive status assessed by this Nurse Health Advisor. No abnormalities found.       6CIT Screen 06/07/2021  What Year? 0 points  What month? 0 points  What time? 0 points  Count back from 20 0 points  Months in reverse 2 points  Repeat phrase 0 points  Total Score 2    Immunizations Immunization History  Administered Date(s) Administered   Influenza Whole 02/25/2019   Influenza,inj,Quad PF,6+ Mos 03/29/2016, 01/18/2017, 04/15/2018   Influenza-Unspecified 01/09/2013, 04/17/2013, 01/15/2021   Moderna Covid-19 Vaccine Bivalent Booster 35yr & up 02/15/2021   Moderna Sars-Covid-2 Vaccination 07/05/2019, 08/05/2019, 03/13/2020   Td 04/18/2011    TDAP status: Up to date  Flu Vaccine status: Up to date  Pneumococcal vaccine status: Due, Education has been provided regarding the importance of this vaccine. Advised may receive this vaccine at local pharmacy or Health Dept. Aware to provide a copy of the vaccination record if obtained from local pharmacy or Health Dept. Verbalized acceptance and understanding.  Covid-19 vaccine status: Completed vaccines  Qualifies for Shingles Vaccine? Yes   Zostavax completed No   Shingrix Completed?: No.    Education has been provided regarding the importance of this vaccine. Patient has been advised to call insurance company to determine out of pocket expense if they have not yet received this vaccine. Advised may also receive vaccine at local pharmacy or Health  Dept. Verbalized acceptance and understanding.  Screening Tests Health Maintenance  Topic Date Due   Zoster Vaccines- Shingrix (1 of 2) Never done   TETANUS/TDAP  04/17/2021   PAP SMEAR-Modifier  04/15/2021   MAMMOGRAM  07/30/2022   Fecal DNA (Cologuard)  02/18/2024   INFLUENZA VACCINE  Completed   COVID-19 Vaccine  Completed   Hepatitis C Screening  Completed   HIV Screening  Completed   HPV VACCINES  Aged Out    Health Maintenance  Health Maintenance Due  Topic Date Due   Zoster Vaccines- Shingrix (1 of 2) Never done   TETANUS/TDAP  04/17/2021   PAP SMEAR-Modifier  04/15/2021    Colorectal cancer screening: Type of screening: Cologuard. Completed 02/17/2021. Repeat every 3 years  Mammogram status: Completed bilateral 07/29/2020. Repeat every year  Bone Density status: Due at age 66 Lung Cancer Screening: (Low Dose CT Chest recommended if Age 65-80years, 30 pack-year currently smoking OR have quit w/in 15years.) does not qualify.     Additional Screening:  Hepatitis C Screening: Completed 08/04/2014  Vision Screening: Recommended annual ophthalmology exams for early detection of glaucoma and other disorders of the eye. Is the patient up to date with their annual eye exam?  Yes  Who is the provider or what is the name of the office in which the patient attends annual eye exams? [facility]   Dental Screening: Recommended annual dental exams  for proper oral hygiene  Community Resource Referral / Chronic Care Management: CRR required this visit?  Yes Social Enrichment  CCM required this visit?  No      Plan:     I have personally reviewed and noted the following in the patients chart:   Medical and social history Use of alcohol, tobacco or illicit drugs  Current medications and supplements including opioid prescriptions.  Functional ability and status Nutritional status Physical activity Advanced directives List of other physicians Hospitalizations,  surgeries, and ER visits in previous 12 months Vitals Screenings to include cognitive, depression, and falls Referrals and appointments  In addition, I have reviewed and discussed with patient certain preventive protocols, quality metrics, and best practice recommendations. A written personalized care plan for preventive services as well as general preventive health recommendations were provided to patient.   Due to this being a telephonic visit, the after visit summary with patients personalized plan was offered to patient via mail or my-chart. Patient would like to access on my-chart.   Marta Antu, LPN   4/72/0721  Nurse Health Advisor  Nurse Notes: None

## 2021-06-07 NOTE — Patient Instructions (Signed)
Robyn Montes , Thank you for taking time to complete your Medicare Wellness Visit. I appreciate your ongoing commitment to your health goals. Please review the following plan we discussed and let me know if I can assist you in the future.   Screening recommendations/referrals: Colonoscopy: Completed Cologuard 02/17/2021-Due 02/18/2024 Recommended yearly ophthalmology/optometry visit for glaucoma screening and checkup Recommended yearly dental visit for hygiene and checkup  Vaccinations: Influenza vaccine: Up to date Pneumococcal vaccine: Due at age 54 Tdap vaccine: May obtain vaccine at your local pharmacy. Shingles vaccine:   May obtain vaccine at your local pharmacy. Covid-19: Up to date  Advanced directives: Information mailed today  Conditions/risks identified: See problem list  Next appointment: Follow up in one year for your annual wellness visit.   Preventive Care 41 Years and Older, Female Preventive care refers to lifestyle choices and visits with your health care provider that can promote health and wellness. What does preventive care include? A yearly physical exam. This is also called an annual well check. Dental exams once or twice a year. Routine eye exams. Ask your health care provider how often you should have your eyes checked. Personal lifestyle choices, including: Daily care of your teeth and gums. Regular physical activity. Eating a healthy diet. Avoiding tobacco and drug use. Limiting alcohol use. Practicing safe sex. Taking low doses of aspirin every day. Taking vitamin and mineral supplements as recommended by your health care provider. What happens during an annual well check? The services and screenings done by your health care provider during your annual well check will depend on your age, overall health, lifestyle risk factors, and family history of disease. Counseling  Your health care provider may ask you questions about your: Alcohol use. Tobacco  use. Drug use. Emotional well-being. Home and relationship well-being. Sexual activity. Eating habits. History of falls. Memory and ability to understand (cognition). Work and work Statistician. Screening  You may have the following tests or measurements: Height, weight, and BMI. Blood pressure. Lipid and cholesterol levels. These may be checked every 5 years, or more frequently if you are over 28 years old. Skin check. Lung cancer screening. You may have this screening every year starting at age 107 if you have a 30-pack-year history of smoking and currently smoke or have quit within the past 15 years. Fecal occult blood test (FOBT) of the stool. You may have this test every year starting at age 59. Flexible sigmoidoscopy or colonoscopy. You may have a sigmoidoscopy every 5 years or a colonoscopy every 10 years starting at age 76. Prostate cancer screening. Recommendations will vary depending on your family history and other risks. Hepatitis C blood test. Hepatitis B blood test. Sexually transmitted disease (STD) testing. Diabetes screening. This is done by checking your blood sugar (glucose) after you have not eaten for a while (fasting). You may have this done every 1-3 years. Abdominal aortic aneurysm (AAA) screening. You may need this if you are a current or former smoker. Osteoporosis. You may be screened starting at age 18 if you are at high risk. Talk with your health care provider about your test results, treatment options, and if necessary, the need for more tests. Vaccines  Your health care provider may recommend certain vaccines, such as: Influenza vaccine. This is recommended every year. Tetanus, diphtheria, and acellular pertussis (Tdap, Td) vaccine. You may need a Td booster every 10 years. Zoster vaccine. You may need this after age 64. Pneumococcal 13-valent conjugate (PCV13) vaccine. One dose is recommended after  age 77. Pneumococcal polysaccharide (PPSV23) vaccine.  One dose is recommended after age 31. Talk to your health care provider about which screenings and vaccines you need and how often you need them. This information is not intended to replace advice given to you by your health care provider. Make sure you discuss any questions you have with your health care provider. Document Released: 04/30/2015 Document Revised: 12/22/2015 Document Reviewed: 02/02/2015 Elsevier Interactive Patient Education  2017 [employer] Prevention in the Home Falls can cause injuries. They can happen to people of all ages. There are many things you can do to make your home safe and to help prevent falls. What can I do on the outside of my home? Regularly fix the edges of walkways and driveways and fix any cracks. Remove anything that might make you trip as you walk through a door, such as a raised step or threshold. Trim any bushes or trees on the path to your home. Use bright outdoor lighting. Clear any walking paths of anything that might make someone trip, such as rocks or tools. Regularly check to see if handrails are loose or broken. Make sure that both sides of any steps have handrails. Any raised decks and porches should have guardrails on the edges. Have any leaves, snow, or ice cleared regularly. Use sand or salt on walking paths during winter. Clean up any spills in your garage right away. This includes oil or grease spills. What can I do in the bathroom? Use night lights. Install grab bars by the toilet and in the tub and shower. Do not use towel bars as grab bars. Use non-skid mats or decals in the tub or shower. If you need to sit down in the shower, use a plastic, non-slip stool. Keep the floor dry. Clean up any water that spills on the floor as soon as it happens. Remove soap buildup in the tub or shower regularly. Attach bath mats securely with double-sided non-slip rug tape. Do not have throw rugs and other things on the floor that can make  you trip. What can I do in the bedroom? Use night lights. Make sure that you have a light by your bed that is easy to reach. Do not use any sheets or blankets that are too big for your bed. They should not hang down onto the floor. Have a firm chair that has side arms. You can use this for support while you get dressed. Do not have throw rugs and other things on the floor that can make you trip. What can I do in the kitchen? Clean up any spills right away. Avoid walking on wet floors. Keep items that you use a lot in easy-to-reach places. If you need to reach something above you, use a strong step stool that has a grab bar. Keep electrical cords out of the way. Do not use floor polish or wax that makes floors slippery. If you must use wax, use non-skid floor wax. Do not have throw rugs and other things on the floor that can make you trip. What can I do with my stairs? Do not leave any items on the stairs. Make sure that there are handrails on both sides of the stairs and use them. Fix handrails that are broken or loose. Make sure that handrails are as long as the stairways. Check any carpeting to make sure that it is firmly attached to the stairs. Fix any carpet that is loose or worn. Avoid having throw rugs  at the top or bottom of the stairs. If you do have throw rugs, attach them to the floor with carpet tape. Make sure that you have a light switch at the top of the stairs and the bottom of the stairs. If you do not have them, ask someone to add them for you. What else can I do to help prevent falls? Wear shoes that: Do not have high heels. Have rubber bottoms. Are comfortable and fit you well. Are closed at the toe. Do not wear sandals. If you use a stepladder: Make sure that it is fully opened. Do not climb a closed stepladder. Make sure that both sides of the stepladder are locked into place. Ask someone to hold it for you, if possible. Clearly mark and make sure that you can  see: Any grab bars or handrails. First and last steps. Where the edge of each step is. Use tools that help you move around (mobility aids) if they are needed. These include: Canes. Walkers. Scooters. Crutches. Turn on the lights when you go into a dark area. Replace any light bulbs as soon as they burn out. Set up your furniture so you have a clear path. Avoid moving your furniture around. If any of your floors are uneven, fix them. If there are any pets around you, be aware of where they are. Review your medicines with your doctor. Some medicines can make you feel dizzy. This can increase your chance of falling. Ask your doctor what other things that you can do to help prevent falls. This information is not intended to replace advice given to you by your health care provider. Make sure you discuss any questions you have with your health care provider. Document Released: 01/28/2009 Document Revised: 09/09/2015 Document Reviewed: 05/08/2014 Elsevier Interactive Patient Education  2017 [employer].

## 2021-06-08 ENCOUNTER — Telehealth: Payer: Self-pay

## 2021-06-08 NOTE — Telephone Encounter (Signed)
° °  Telephone encounter was:  Successful.  06/08/2021 Name: Robyn Montes MRN: [id number] DOB: [birth year]  [name] is a 66 y.o. year old female who is a primary care patient of Copland, Gay Filler, MD . The community resource team was consulted for assistance with  silver sneakers  Care guide performed the following interventions: Patient provided with information about care guide support team and interviewed to confirm resource needs.Patient is intrested in social activities such as silver sneakers and activity groups, I have mailed resources and will follow up with her in a week  Follow Up Plan:  Care guide will follow up with patient by phone over the next week    [facility]  [phone] [address], [place], [place] [address] Phone: [phone] Email: [email or web address]

## 2021-06-14 ENCOUNTER — Encounter: Payer: Self-pay | Admitting: Family Medicine

## 2021-06-16 ENCOUNTER — Other Ambulatory Visit (INDEPENDENT_AMBULATORY_CARE_PROVIDER_SITE_OTHER): Payer: Medicare HMO

## 2021-06-16 DIAGNOSIS — R7303 Prediabetes: Secondary | ICD-10-CM

## 2021-06-16 DIAGNOSIS — I10 Essential (primary) hypertension: Secondary | ICD-10-CM | POA: Diagnosis not present

## 2021-06-16 DIAGNOSIS — E038 Other specified hypothyroidism: Secondary | ICD-10-CM

## 2021-06-16 DIAGNOSIS — Z13220 Encounter for screening for lipoid disorders: Secondary | ICD-10-CM

## 2021-06-16 DIAGNOSIS — E559 Vitamin D deficiency, unspecified: Secondary | ICD-10-CM

## 2021-06-16 DIAGNOSIS — N8110 Cystocele, unspecified: Secondary | ICD-10-CM | POA: Diagnosis not present

## 2021-06-16 DIAGNOSIS — N3946 Mixed incontinence: Secondary | ICD-10-CM | POA: Diagnosis not present

## 2021-06-16 NOTE — Progress Notes (Deleted)
Therapist, music at [facility] ?[address], [address] ?[place], [place] [address] ?336 [phone] ?Fax [phone] ? ?Date:  06/20/2021  ? ?Name:  Robyn Montes   DOB:  Jan 27, 1956   MRN:  [id number] ? ?PCP:  Darreld Mclean, MD  ? ? ?Chief Complaint: No chief complaint on file. ? ? ?History of Present Illness: ? ?Robyn Montes is a 66 y.o. very pleasant female patient who presents with the following: ? ?Virtual visit today to discuss medication for weight loss  ?Pt last seen by myself virtually a month ago- History of prediabetes, hypertension, hypothyroidism, hepatitis C status post curative treatment, memory loss ? ?BMI one year ago 18 ? ? ?Results for orders placed or performed in visit on 06/16/21  ?CBC  ?Result Value Ref Range  ? WBC 9.9 4.0 - 10.5 K/uL  ? RBC 4.56 3.87 - 5.11 Mil/uL  ? Platelets 207.0 150.0 - 400.0 K/uL  ? Hemoglobin 12.2 12.0 - 15.0 g/dL  ? HCT 37.6 36.0 - 46.0 %  ? MCV 82.6 78.0 - 100.0 fl  ? MCHC 32.5 30.0 - 36.0 g/dL  ? RDW 15.2 11.5 - 15.5 %  ?Comprehensive metabolic panel  ?Result Value Ref Range  ? Sodium 141 135 - 145 mEq/L  ? Potassium 4.3 3.5 - 5.1 mEq/L  ? Chloride 103 96 - 112 mEq/L  ? CO2 30 19 - 32 mEq/L  ? Glucose, Bld 87 70 - 99 mg/dL  ? BUN 11 6 - 23 mg/dL  ? Creatinine, Ser 0.77 0.40 - 1.20 mg/dL  ? Total Bilirubin 0.3 0.2 - 1.2 mg/dL  ? Alkaline Phosphatase 61 39 - 117 U/L  ? AST 19 0 - 37 U/L  ? ALT 12 0 - 35 U/L  ? Total Protein 7.4 6.0 - 8.3 g/dL  ? Albumin 4.3 3.5 - 5.2 g/dL  ? GFR 81.63 >60.00 mL/min  ? Calcium 9.7 8.4 - 10.5 mg/dL  ?Hemoglobin A1c  ?Result Value Ref Range  ? Hgb A1c MFr Bld 6.0 4.6 - 6.5 %  ?Lipid panel  ?Result Value Ref Range  ? Cholesterol 190 0 - 200 mg/dL  ? Triglycerides 116.0 0.0 - 149.0 mg/dL  ? HDL 46.40 >39.00 mg/dL  ? VLDL 23.2 0.0 - 40.0 mg/dL  ? LDL Cholesterol 121 (H) 0 - 99 mg/dL  ? Total CHOL/HDL Ratio 4   ? NonHDL 143.74   ?TSH  ?Result Value Ref Range  ? TSH 1.84 0.35 - 5.50 uIU/mL  ?VITAMIN D 25 Hydroxy  (Vit-D Deficiency, Fractures)  ?Result Value Ref Range  ? VITD 66.16 30.00 - 100.00 ng/mL  ? ? ? ? ?Patient Active Problem List  ? Diagnosis Date Noted  ? Acne vulgaris 12/17/2020  ? Prediabetes 05/26/2019  ? Hepatitis B immune 04/04/2016  ? Hypothyroidism 10/01/2014  ? Memory loss 08/21/2014  ? Chronic hepatitis C ([place]) 08/04/2014  ? History of other specified conditions presenting hazards to health 08/04/2014  ? Palpitations 10/03/2013  ? Essential hypertension 10/03/2013  ? Biliary calculi 01/17/2013  ? ? ?Past Medical History:  ?Diagnosis Date  ? Anemia 1976, 1984, 2001, 2003  ? history of multiple transfusions (3); iron deficiency  ? Arthritis   ? hands, knees B; DDD lumbar  ? Blood transfusion without reported diagnosis   ? Family history of adverse reaction to anesthesia   ? pt's mother and daughter both had significant heart rate drops with anesthesia, believes her mother may have had a mini  stroke while under anesthesia  ? Family history of heart disease   ? Heart murmur   ? Hepatitis C 04/17/2013  ? acquired from blood transfusion; s/p [facility] in [place].  ? HTN (hypertension)   ? Hypothyroid   ? Memory loss 08/21/2014  ? Palpitations   ? Pre-diabetes   ? on metformin for prediabetes  ? Shortness of breath   ? Sleep apnea   ? tested over 20 year ago, does not use cpap per patient  ? ? ?Past Surgical History:  ?Procedure Laterality Date  ? LIVER BIOPSY    ? ? ?Social History  ? ?Tobacco Use  ? Smoking status: Never  ? Smokeless tobacco: Never  ?Vaping Use  ? Vaping Use: Never used  ?Substance Use Topics  ? Alcohol use: No  ? Drug use: No  ? ? ?Family History  ?Problem Relation Age of Onset  ? Heart disease Mother   ?     heart attack and stroke; passed away in her 24s  ? Diabetes Mother   ? Hyperlipidemia Mother   ? Hypertension Mother   ? Stroke Mother   ? Heart disease Father   ?     passed away in his 42s  ? Hyperlipidemia Father   ? Diabetes Brother   ? Heart disease Brother   ?  Hypertension Brother   ? Diabetes Brother   ? Hypertension Sister   ? Breast cancer Maternal Aunt   ? ? ?Allergies  ?Allergen Reactions  ? Bee Venom Other (See Comments)  ?  Unknown  ? ? ?Medication list has been reviewed and updated. ? ?Current Outpatient Medications on File Prior to Visit  ?Medication Sig Dispense Refill  ? ACCU-CHEK GUIDE test strip USE TO CHECK BLOOD SUGAR 3 TIMES A DAY. DX CODE: E11.9 300 strip 1  ? Accu-Chek Softclix Lancets lancets USE TO CHECK BLOOD SUGAR 3 TIMES A DAY 300 each 1  ? adapalene (DIFFERIN) 0.1 % gel Apply daily as needed (Patient not taking: Reported on 06/07/2021) 14 g 3  ? Blood Glucose Monitoring Suppl (ACCU-CHEK GUIDE) w/Device KIT CHECK BLOOD SUGAR 3 TIMES A DAY. DX CODE: E11.9 1 kit 0  ? Brimonidine Tartrate 0.025 % SOLN Place 1-2 drops into both eyes daily as needed (for dry eyes).    ? donepezil (ARICEPT) 10 MG tablet Take 1 tablet (10 mg total) by mouth at bedtime. May increase to 2 pills after 4 weeks (Patient not taking: Reported on 06/07/2021) 30 tablet 5  ? EPINEPHrine 0.3 mg/0.3 mL IJ SOAJ injection Inject 0.3 mLs (0.3 mg total) into the skin once as needed for up to 1 dose for anaphylaxis. (Patient not taking: Reported on 06/07/2021) 2 Device 3  ? hydroquinone 4 % cream APPLY TO AFFECTED AREA TOPICALLY DAILY FOR 2-3 WEEKS ONLY 28.35 g 1  ? Insulin Pen Needle (COMFORT EZ PEN NEEDLES) 32G X 5 MM MISC Use daily for Saxenda injection (Patient not taking: Reported on 06/07/2021) 100 each 3  ? losartan-hydrochlorothiazide (HYZAAR) 50-12.5 MG tablet TAKE 1 TABLET BY MOUTH TWICE A DAY 180 tablet 1  ? lovastatin (MEVACOR) 20 MG tablet TAKE 1 TABLET BY MOUTH EVERYDAY AT BEDTIME (Patient not taking: Reported on 06/07/2021) 90 tablet 1  ? metFORMIN (GLUCOPHAGE) 500 MG tablet Take 1 tablet (500 mg total) by mouth 2 (two) times daily with a meal. 180 tablet 3  ? metoprolol succinate (TOPROL-XL) 50 MG 24 hr tablet Take 1 tablet (50 mg total) by  mouth daily. TAKE WITH OR IMMEDIATELY  FOLLOWING A MEAL. 90 tablet 3  ? Naphazoline HCl (CLEAR EYES OP) Place 2 drops into both eyes daily as needed (for dry eyes).    ? potassium chloride (K-DUR) 10 MEQ tablet Take 1 tablet (10 mEq total) by mouth daily. (Patient not taking: Reported on 06/07/2021) 30 tablet 1  ? SYNTHROID 100 MCG tablet TAKE 1 TABLET BY MOUTH EVERY DAY BEFORE BREAKFAST 90 tablet 1  ? tretinoin (RETIN-A) 0.05 % cream Apply topically at bedtime as needed.    ? Vitamin D, Ergocalciferol, (DRISDOL) 50000 units CAPS capsule TAKE ONE CAPSULE BY MOUTH ONCE A WEEK 12 capsule 0  ? ?No current facility-administered medications on file prior to visit.  ? ? ?Review of Systems: ? ?As per HPI- otherwise negative. ? ? ?Physical Examination: ?There were no vitals filed for this visit. ?There were no vitals filed for this visit. ?There is no height or weight on file to calculate BMI. ?Ideal Body Weight:   ? ? ? ?Assessment and Plan: ?*** ? ?Signed ?Lamar Blinks, MD ? ?

## 2021-06-17 LAB — CBC
HCT: 37.6 % (ref 36.0–46.0)
Hemoglobin: 12.2 g/dL (ref 12.0–15.0)
MCHC: 32.5 g/dL (ref 30.0–36.0)
MCV: 82.6 fl (ref 78.0–100.0)
Platelets: 207 10*3/uL (ref 150.0–400.0)
RBC: 4.56 Mil/uL (ref 3.87–5.11)
RDW: 15.2 % (ref 11.5–15.5)
WBC: 9.9 10*3/uL (ref 4.0–10.5)

## 2021-06-17 LAB — COMPREHENSIVE METABOLIC PANEL
ALT: 12 U/L (ref 0–35)
AST: 19 U/L (ref 0–37)
Albumin: 4.3 g/dL (ref 3.5–5.2)
Alkaline Phosphatase: 61 U/L (ref 39–117)
BUN: 11 mg/dL (ref 6–23)
CO2: 30 mEq/L (ref 19–32)
Calcium: 9.7 mg/dL (ref 8.4–10.5)
Chloride: 103 mEq/L (ref 96–112)
Creatinine, Ser: 0.77 mg/dL (ref 0.40–1.20)
GFR: 81.63 mL/min (ref >60.00)
Glucose, Bld: 87 mg/dL (ref 70–99)
Potassium: 4.3 mEq/L (ref 3.5–5.1)
Sodium: 141 mEq/L (ref 135–145)
Total Bilirubin: 0.3 mg/dL (ref 0.2–1.2)
Total Protein: 7.4 g/dL (ref 6.0–8.3)

## 2021-06-17 LAB — HEMOGLOBIN A1C: Hgb A1c MFr Bld: 6 % (ref 4.6–6.5)

## 2021-06-17 LAB — LIPID PANEL
Cholesterol: 190 mg/dL (ref 0–200)
HDL: 46.4 mg/dL (ref >39.00)
LDL Cholesterol: 121 mg/dL — ABNORMAL HIGH (ref 0–99)
NonHDL: 143.74
Total CHOL/HDL Ratio: 4
Triglycerides: 116 mg/dL (ref 0.0–149.0)
VLDL: 23.2 mg/dL (ref 0.0–40.0)

## 2021-06-17 LAB — VITAMIN D 25 HYDROXY (VIT D DEFICIENCY, FRACTURES): VITD: 66.16 ng/mL (ref 30.00–100.00)

## 2021-06-17 LAB — TSH: TSH: 1.84 u[IU]/mL (ref 0.35–5.50)

## 2021-06-20 ENCOUNTER — Telehealth (INDEPENDENT_AMBULATORY_CARE_PROVIDER_SITE_OTHER): Payer: Medicare HMO | Admitting: Family Medicine

## 2021-06-20 ENCOUNTER — Encounter: Payer: Self-pay | Admitting: Family Medicine

## 2021-06-20 ENCOUNTER — Telehealth: Payer: Medicare HMO | Admitting: Family Medicine

## 2021-06-20 VITALS — BP 140/86 | HR 80 | Ht 64.5 in | Wt 217.0 lb

## 2021-06-20 MED ORDER — TIRZEPATIDE 2.5 MG/0.5ML ~~LOC~~ SOAJ: 2.5000 mg | SUBCUTANEOUS | 3 refills | Status: DC

## 2021-06-20 NOTE — Progress Notes (Signed)
[facility] at [facility] [address] [place], [place] [address] [phone]  Date:  06/20/2021   Name:  Robyn Montes   DOB:  16-Apr-1956   MRN:  [id number]  PCP:  Darreld Mclean, MD    Chief Complaint: No chief complaint on file.   History of Present Illness:  Robyn Montes is a 66 y.o. very pleasant female patient who presents with the following:  Virtual visit today to discuss medication for weight loss  Pt last seen by myself virtually a month ago- History of prediabetes, hypertension, hypothyroidism, hepatitis C status post curative treatment, memory loss  BMI one year ago 35  We dicussed her labs on the phone together today  Stable, A1c in prediabetes range Pt notes she tried saxenda for weight loss but did not tolerate it well; she had a lot of nausea and stomach upset  Wt Readings from Last 3 Encounters:  06/20/21 217 lb (98.4 kg)  06/20/21 217 lb (98.4 kg)  06/07/21 209 lb (94.8 kg)   She is 5'4.5" tall -BMI is approximately 36.7 No history of pancreatitis No personal or family history of thyroid cancer or multiple endocrine neoplasia syndrome.  She notes several of her friends are using Mounjaro with good results, she would like to use this 2 if possible  Results for orders placed or performed in visit on 06/16/21  CBC  Result Value Ref Range   WBC 9.9 4.0 - 10.5 K/uL   RBC 4.56 3.87 - 5.11 Mil/uL   Platelets 207.0 150.0 - 400.0 K/uL   Hemoglobin 12.2 12.0 - 15.0 g/dL   HCT 37.6 36.0 - 46.0 %   MCV 82.6 78.0 - 100.0 fl   MCHC 32.5 30.0 - 36.0 g/dL   RDW 15.2 11.5 - 15.5 %  Comprehensive metabolic panel  Result Value Ref Range   Sodium 141 135 - 145 mEq/L   Potassium 4.3 3.5 - 5.1 mEq/L   Chloride 103 96 - 112 mEq/L   CO2 30 19 - 32 mEq/L   Glucose, Bld 87 70 - 99 mg/dL   BUN 11 6 - 23 mg/dL   Creatinine, Ser 0.77 0.40 - 1.20 mg/dL   Total Bilirubin 0.3 0.2 - 1.2 mg/dL   Alkaline  Phosphatase 61 39 - 117 U/L   AST 19 0 - 37 U/L   ALT 12 0 - 35 U/L   Total Protein 7.4 6.0 - 8.3 g/dL   Albumin 4.3 3.5 - 5.2 g/dL   GFR 81.63 >60.00 mL/min   Calcium 9.7 8.4 - 10.5 mg/dL  Hemoglobin A1c  Result Value Ref Range   Hgb A1c MFr Bld 6.0 4.6 - 6.5 %  Lipid panel  Result Value Ref Range   Cholesterol 190 0 - 200 mg/dL   Triglycerides 116.0 0.0 - 149.0 mg/dL   HDL 46.40 >39.00 mg/dL   VLDL 23.2 0.0 - 40.0 mg/dL   LDL Cholesterol 121 (H) 0 - 99 mg/dL   Total CHOL/HDL Ratio 4    NonHDL 143.74   TSH  Result Value Ref Range   TSH 1.84 0.35 - 5.50 uIU/mL  VITAMIN D 25 Hydroxy (Vit-D Deficiency, Fractures)  Result Value Ref Range   VITD 66.16 30.00 - 100.00 ng/mL    Patient Active Problem List   Diagnosis Date Noted   Acne vulgaris 12/17/2020   Prediabetes 05/26/2019   Hepatitis B immune 04/04/2016   Hypothyroidism 10/01/2014   Memory loss  08/21/2014   Chronic hepatitis C ([place]) 08/04/2014   History of other specified conditions presenting hazards to health 08/04/2014   Palpitations 10/03/2013   Essential hypertension 10/03/2013   Biliary calculi 01/17/2013    Past Medical History:  Diagnosis Date   Anemia 1976, 1984, 2001, 2003   history of multiple transfusions (3); iron deficiency   Arthritis    hands, knees B; DDD lumbar   Blood transfusion without reported diagnosis    Family history of adverse reaction to anesthesia    pt's mother and daughter both had significant heart rate drops with anesthesia, believes her mother may have had a mini stroke while under anesthesia   Family history of heart disease    Heart murmur    Hepatitis C 04/17/2013   acquired from blood transfusion; s/p [facility] in [place].   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   Palpitations    Pre-diabetes    on metformin for prediabetes   Shortness of breath    Sleep apnea    tested over 20 year ago, does not use cpap per patient    Past  Surgical History:  Procedure Laterality Date   LIVER BIOPSY      Social History   Tobacco Use   Smoking status: Never   Smokeless tobacco: Never  Vaping Use   Vaping Use: Never used  Substance Use Topics   Alcohol use: No   Drug use: No    Family History  Problem Relation Age of Onset   Heart disease Mother        heart attack and stroke; passed away in her 12s   Diabetes Mother    Hyperlipidemia Mother    Hypertension Mother    Stroke Mother    Heart disease Father        passed away in his 91s   Hyperlipidemia Father    Diabetes Brother    Heart disease Brother    Hypertension Brother    Diabetes Brother    Hypertension Sister    Breast cancer Maternal Aunt     Allergies  Allergen Reactions   Bee Venom Other (See Comments)    Unknown    Medication list has been reviewed and updated.  Current Outpatient Medications on File Prior to Visit  Medication Sig Dispense Refill   ACCU-CHEK GUIDE test strip USE TO CHECK BLOOD SUGAR 3 TIMES A DAY. DX CODE: E11.9 300 strip 1   Accu-Chek Softclix Lancets lancets USE TO CHECK BLOOD SUGAR 3 TIMES A DAY 300 each 1   adapalene (DIFFERIN) 0.1 % gel Apply daily as needed (Patient not taking: Reported on 06/07/2021) 14 g 3   Blood Glucose Monitoring Suppl (ACCU-CHEK GUIDE) w/Device KIT CHECK BLOOD SUGAR 3 TIMES A DAY. DX CODE: E11.9 1 kit 0   Brimonidine Tartrate 0.025 % SOLN Place 1-2 drops into both eyes daily as needed (for dry eyes).     donepezil (ARICEPT) 10 MG tablet Take 1 tablet (10 mg total) by mouth at bedtime. May increase to 2 pills after 4 weeks (Patient not taking: Reported on 06/07/2021) 30 tablet 5   EPINEPHrine 0.3 mg/0.3 mL IJ SOAJ injection Inject 0.3 mLs (0.3 mg total) into the skin once as needed for up to 1 dose for anaphylaxis. (Patient not taking: Reported on 06/07/2021) 2 Device 3   hydroquinone 4 % cream APPLY TO AFFECTED AREA TOPICALLY DAILY FOR 2-3 WEEKS ONLY 28.35 g 1  Insulin Pen Needle (COMFORT EZ PEN  NEEDLES) 32G X 5 MM MISC Use daily for Saxenda injection (Patient not taking: Reported on 06/07/2021) 100 each 3   losartan-hydrochlorothiazide (HYZAAR) 50-12.5 MG tablet TAKE 1 TABLET BY MOUTH TWICE A DAY 180 tablet 1   lovastatin (MEVACOR) 20 MG tablet TAKE 1 TABLET BY MOUTH EVERYDAY AT BEDTIME (Patient not taking: Reported on 06/07/2021) 90 tablet 1   metFORMIN (GLUCOPHAGE) 500 MG tablet Take 1 tablet (500 mg total) by mouth 2 (two) times daily with a meal. 180 tablet 3   metoprolol succinate (TOPROL-XL) 50 MG 24 hr tablet Take 1 tablet (50 mg total) by mouth daily. TAKE WITH OR IMMEDIATELY FOLLOWING A MEAL. 90 tablet 3   Naphazoline HCl (CLEAR EYES OP) Place 2 drops into both eyes daily as needed (for dry eyes).     potassium chloride (K-DUR) 10 MEQ tablet Take 1 tablet (10 mEq total) by mouth daily. (Patient not taking: Reported on 06/07/2021) 30 tablet 1   SYNTHROID 100 MCG tablet TAKE 1 TABLET BY MOUTH EVERY DAY BEFORE BREAKFAST 90 tablet 1   tretinoin (RETIN-A) 0.05 % cream Apply topically at bedtime as needed.     Vitamin D, Ergocalciferol, (DRISDOL) 50000 units CAPS capsule TAKE ONE CAPSULE BY MOUTH ONCE A WEEK 12 capsule 0   No current facility-administered medications on file prior to visit.    Review of Systems:  As per HPI- otherwise negative.   Physical Examination: There were no vitals filed for this visit. There were no vitals filed for this visit. There is no height or weight on file to calculate BMI. Ideal Body Weight:    Patient observed over video monitor for duration of visit today She looks well, her normal self.  No distress or shortness of breath is noted Assessment and Plan: Morbid obesity ([place]) - Plan: tirzepatide Darcel Bayley) 2.5 MG/0.5ML Pen  Virtual visit today to discuss obesity Patient has used Saxenda in the past but did not have great results.  She is interested in trying [facility].  She notes this is an off label use and will require prior  authorization  Prescription sent in for [facility] 2.5.  Advised we will use this strength for 1 month, will go up from there as needed and tolerated  She will let me know how she does with this drug  Signed Lamar Blinks, MD

## 2021-06-21 NOTE — Progress Notes (Signed)
Appointment moved to a different time ?

## 2021-06-22 ENCOUNTER — Telehealth: Payer: Medicare HMO | Admitting: Family Medicine

## 2021-06-27 ENCOUNTER — Other Ambulatory Visit: Payer: Self-pay | Admitting: Family Medicine

## 2021-07-18 ENCOUNTER — Telehealth: Payer: Self-pay | Admitting: Family Medicine

## 2021-07-18 DIAGNOSIS — N3946 Mixed incontinence: Secondary | ICD-10-CM | POA: Diagnosis not present

## 2021-07-18 DIAGNOSIS — N8110 Cystocele, unspecified: Secondary | ICD-10-CM | POA: Diagnosis not present

## 2021-07-18 MED ORDER — TIRZEPATIDE 5 MG/0.5ML ~~LOC~~ SOAJ: 5.0000 mg | SUBCUTANEOUS | 1 refills | Status: DC

## 2021-07-18 NOTE — Telephone Encounter (Signed)
Increase to 0.5? ?

## 2021-07-18 NOTE — Telephone Encounter (Signed)
Sent in 5 mg dose ?

## 2021-07-18 NOTE — Telephone Encounter (Signed)
Last Robyn Montes was 2.5 mg and she has completed that dose now. ?She is doing well on this and having no problems. ?Would like to know what to increase to now.and [facility] [address] ?She would like a call back to let her know what is being sent in.  ?

## 2021-08-18 DIAGNOSIS — N3946 Mixed incontinence: Secondary | ICD-10-CM | POA: Diagnosis not present

## 2021-08-18 DIAGNOSIS — N8110 Cystocele, unspecified: Secondary | ICD-10-CM | POA: Diagnosis not present

## 2021-08-22 ENCOUNTER — Other Ambulatory Visit: Payer: Self-pay | Admitting: Family Medicine

## 2021-08-22 DIAGNOSIS — R7303 Prediabetes: Secondary | ICD-10-CM

## 2021-09-19 DIAGNOSIS — N8110 Cystocele, unspecified: Secondary | ICD-10-CM | POA: Diagnosis not present

## 2021-09-19 DIAGNOSIS — N3946 Mixed incontinence: Secondary | ICD-10-CM | POA: Diagnosis not present

## 2021-10-13 ENCOUNTER — Other Ambulatory Visit: Payer: Self-pay | Admitting: Family Medicine

## 2021-10-13 ENCOUNTER — Telehealth: Payer: Self-pay | Admitting: Family Medicine

## 2021-10-13 DIAGNOSIS — I10 Essential (primary) hypertension: Secondary | ICD-10-CM

## 2021-10-13 DIAGNOSIS — E038 Other specified hypothyroidism: Secondary | ICD-10-CM

## 2021-10-13 MED ORDER — TIRZEPATIDE 7.5 MG/0.5ML ~~LOC~~ SOAJ
7.5000 mg | SUBCUTANEOUS | 3 refills | Status: DC
Start: 2021-10-13 — End: 2021-11-09

## 2021-10-13 NOTE — Telephone Encounter (Signed)
Rx has been sent  

## 2021-10-13 NOTE — Telephone Encounter (Signed)
Pt called back stating she needed the follow medications refilled as well:  Medication:   tirzepatide [facility]) 5 MG/0.5ML Pen [[id number]]   losartan-hydrochlorothiazide (HYZAAR) 50-12.5 MG tablet [[id number]]   Has the patient contacted their pharmacy? No. (If no, request that the patient contact the pharmacy for the refill.) (If yes, when and what did the pharmacy advise?)  Preferred Pharmacy (with phone number or street name):   [id number] [address] Phone:  [phone] Fax:  [phone]  Agent: Please be advised that RX refills may take up to 3 business days. We ask that you follow-up with your pharmacy.

## 2021-10-13 NOTE — Telephone Encounter (Signed)
Medication: SYNTHROID 100 MCG tablet  90 day suply Has the patient contacted their pharmacy? Yes.    Preferred Pharmacy (with phone number or street name): [id number] [address] Phone:  [phone] Fax:  [phone]  Agent: Please be advised that RX refills may take up to 3 business days. We ask that you follow-up with your pharmacy.

## 2021-10-13 NOTE — Telephone Encounter (Signed)
Okay to increase dose?

## 2021-10-13 NOTE — Addendum Note (Signed)
Addended by: Lamar Blinks C on: 10/13/2021 05:07 PM   Modules accepted: Orders

## 2021-10-13 NOTE — Telephone Encounter (Signed)
Patient would like to increase the mounjaro to 7.5 since she has been on the 5 mg for a few months. Please advise

## 2021-10-14 ENCOUNTER — Other Ambulatory Visit: Payer: Self-pay | Admitting: Family Medicine

## 2021-10-14 DIAGNOSIS — E785 Hyperlipidemia, unspecified: Secondary | ICD-10-CM

## 2021-11-08 NOTE — Progress Notes (Unsigned)
Robyn Montes [address] [place], [place] [address] [phone]  Date:  11/09/2021   Name:  Robyn Montes   DOB:  07-20-1955   MRN:  [id number]  PCP:  Darreld Mclean, MD    Chief Complaint: No chief complaint on file.   History of Present Illness:  Robyn Montes is a 66 y.o. very pleasant female patient who presents with the following:  Virtual visit today to discuss medications  Pt last seen by myself in March of this year Patient location is home, location is office.  Patient and confirmed with 2 factors, she gives consent for virtual visit today The patient myself are present on the call today  History of prediabetes, hypertension, hypothyroidism, hepatitis C status post curative treatment, memory loss  At her visit in March I prescribed Mounjaro for weight loss-recently refilled with the 5 mg strength  Pt notes she was off Mounjaro for a while due to illness in her family and travel She also got herself a treadmill and is seeing a therapist -overall she feels like her life is back on track She would like to start back, due to a gap in therapy we will have her go back to 2.5 mg for now  She has not been taking aricept but would like to start back on this- will have her go back on 5 mg now, can increase to 10 mg after a month  Will also order her mammogram   Patient Active Problem List   Diagnosis Date Noted   Acne vulgaris 12/17/2020   Prediabetes 05/26/2019   Hepatitis B immune 04/04/2016   Hypothyroidism 10/01/2014   Memory loss 08/21/2014   Chronic hepatitis C ([place]) 08/04/2014   History of other specified conditions presenting hazards to health 08/04/2014   Palpitations 10/03/2013   Essential hypertension 10/03/2013   Biliary calculi 01/17/2013    Past Medical History:  Diagnosis Date   Anemia 1976, 1984, 2001, 2003   history of multiple transfusions (3); iron deficiency   Arthritis     hands, knees B; DDD lumbar   Blood transfusion without reported diagnosis    Family history of adverse reaction to anesthesia    pt's mother and daughter both had significant heart rate drops with anesthesia, believes her mother may have had a mini stroke while under anesthesia   Family history of heart disease    Heart murmur    Hepatitis C 04/17/2013   acquired from blood transfusion; s/p [facility] in [place].   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   Palpitations    Pre-diabetes    on metformin for prediabetes   Shortness of breath    Sleep apnea    tested over 20 year ago, does not use cpap per patient    Past Surgical History:  Procedure Laterality Date   LIVER BIOPSY      Social History   Tobacco Use   Smoking status: Never   Smokeless tobacco: Never  Vaping Use   Vaping Use: Never used  Substance Use Topics   Alcohol use: No   Drug use: No    Family History  Problem Relation Age of Onset   Heart disease Mother        heart attack and stroke; passed away in her 75s   Diabetes Mother    Hyperlipidemia Mother    Hypertension Mother  Stroke Mother    Heart disease Father        passed away in his 43s   Hyperlipidemia Father    Diabetes Brother    Heart disease Brother    Hypertension Brother    Diabetes Brother    Hypertension Sister    Breast cancer Maternal Aunt     Allergies  Allergen Reactions   Bee Venom Other (See Comments)    Unknown    Medication list has been reviewed and updated.  Current Outpatient Medications on File Prior to Visit  Medication Sig Dispense Refill   ACCU-CHEK GUIDE test strip USE TO CHECK BLOOD SUGAR 3 TIMES A DAY. DX CODE: E11.9 300 strip 1   Accu-Chek Softclix Lancets lancets USE TO CHECK BLOOD SUGAR 3 TIMES A DAY 300 each 1   adapalene (DIFFERIN) 0.1 % gel Apply daily as needed (Patient not taking: Reported on 06/07/2021) 14 g 3   Blood Glucose Monitoring Suppl (ACCU-CHEK  GUIDE) w/Device KIT USE TO CHECK BLOOD SUGAR 3 TIMES A DAY 1 kit 0   Brimonidine Tartrate 0.025 % SOLN Place 1-2 drops into both eyes daily as needed (for dry eyes).     donepezil (ARICEPT) 10 MG tablet Take 1 tablet (10 mg total) by mouth at bedtime. May increase to 2 pills after 4 weeks (Patient not taking: Reported on 06/20/2021) 30 tablet 5   EPINEPHrine 0.3 mg/0.3 mL IJ SOAJ injection Inject 0.3 mLs (0.3 mg total) into the skin once as needed for up to 1 dose for anaphylaxis. (Patient not taking: Reported on 06/07/2021) 2 Device 3   hydroquinone 4 % cream APPLY TO AFFECTED AREA TOPICALLY DAILY FOR 2-3 WEEKS ONLY 28.35 g 1   Insulin Pen Needle (COMFORT EZ PEN NEEDLES) 32G X 5 MM MISC Use daily for Saxenda injection (Patient not taking: Reported on 06/07/2021) 100 each 3   losartan-hydrochlorothiazide (HYZAAR) 50-12.5 MG tablet TAKE 1 TABLET BY MOUTH TWICE A DAY 180 tablet 3   lovastatin (MEVACOR) 20 MG tablet TAKE 1 TABLET BY MOUTH EVERYDAY AT BEDTIME 90 tablet 1   metFORMIN (GLUCOPHAGE) 500 MG tablet TAKE 1 TABLET BY MOUTH 2 TIMES DAILY WITH A MEAL. 180 tablet 3   metoprolol succinate (TOPROL-XL) 50 MG 24 hr tablet Take 1 tablet (50 mg total) by mouth daily. TAKE WITH OR IMMEDIATELY FOLLOWING A MEAL. 90 tablet 3   Naphazoline HCl (CLEAR EYES OP) Place 2 drops into both eyes daily as needed (for dry eyes).     potassium chloride (K-DUR) 10 MEQ tablet Take 1 tablet (10 mEq total) by mouth daily. (Patient not taking: Reported on 06/20/2021) 30 tablet 1   SYNTHROID 100 MCG tablet TAKE 1 TABLET BY MOUTH EVERY DAY BEFORE BREAKFAST 90 tablet 1   tirzepatide (MOUNJARO) 7.5 MG/0.5ML Pen Inject 7.5 mg into the skin once a week. 6 mL 3   tretinoin (RETIN-A) 0.05 % cream Apply topically at bedtime as needed.     Vitamin D, Ergocalciferol, (DRISDOL) 50000 units CAPS capsule TAKE ONE CAPSULE BY MOUTH ONCE A WEEK (Patient not taking: Reported on 06/20/2021) 12 capsule 0   No current facility-administered medications  on file prior to visit.    Review of Systems:  As per HPI- otherwise negative.   Physical Examination: There were no vitals filed for this visit. There were no vitals filed for this visit. There is no height or weight on file to calculate BMI. Ideal Body Weight:   Pt observed via video monitor- she looks  well, no distress noted    Assessment and Plan: Morbid obesity ([place]) - Plan: tirzepatide (MOUNJARO) 2.5 MG/0.5ML Pen  Memory loss - Plan: donepezil (ARICEPT) 5 MG tablet  Bee allergy status - Plan: EPINEPHrine 0.3 mg/0.3 mL IJ SOAJ injection  Screening mammogram for breast cancer - Plan: MM 3D SCREEN BREAST BILATERAL Today.  Patient has been using Mounjaro for weight loss, but recently had a gap in therapy due to family emergency.  We will have her start back on 2.5 mg and can titrate dose as needed  Known history of memory loss, start back on Aricept  Patient notes her EpiPen is expired, gave a new prescription  Order mammogram  Schedule appointment for to see me in 1 month for physical  Signed Lamar Blinks, MD

## 2021-11-09 ENCOUNTER — Telehealth (INDEPENDENT_AMBULATORY_CARE_PROVIDER_SITE_OTHER): Payer: Medicare HMO | Admitting: Family Medicine

## 2021-11-09 ENCOUNTER — Other Ambulatory Visit: Payer: Self-pay | Admitting: Family Medicine

## 2021-11-09 DIAGNOSIS — Z91030 Bee allergy status: Secondary | ICD-10-CM | POA: Diagnosis not present

## 2021-11-09 DIAGNOSIS — R413 Other amnesia: Secondary | ICD-10-CM

## 2021-11-09 DIAGNOSIS — Z1231 Encounter for screening mammogram for malignant neoplasm of breast: Secondary | ICD-10-CM

## 2021-11-09 MED ORDER — EPINEPHRINE 0.3 MG/0.3ML IJ SOAJ: 0.3000 mg | Freq: Once | INTRAMUSCULAR | 3 refills | Status: DC | PRN

## 2021-11-09 MED ORDER — TIRZEPATIDE 2.5 MG/0.5ML ~~LOC~~ SOAJ: 2.5000 mg | SUBCUTANEOUS | 3 refills | Status: DC

## 2021-11-09 MED ORDER — DONEPEZIL HCL 5 MG PO TABS: 5.0000 mg | ORAL_TABLET | Freq: Every day | ORAL | 3 refills | Status: DC

## 2021-11-09 NOTE — Telephone Encounter (Signed)
Insurance will only cover 1 tablet daily, request to increase to '10mg'$  tablet.

## 2021-11-23 ENCOUNTER — Other Ambulatory Visit: Payer: Self-pay

## 2021-11-23 ENCOUNTER — Telehealth: Payer: Self-pay | Admitting: Family Medicine

## 2021-11-23 DIAGNOSIS — L578 Other skin changes due to chronic exposure to nonionizing radiation: Secondary | ICD-10-CM

## 2021-11-23 MED ORDER — TRETINOIN 0.05 % EX CREA: TOPICAL_CREAM | Freq: Every evening | CUTANEOUS | 0 refills | Status: DC | PRN

## 2021-11-23 MED ORDER — HYDROQUINONE 4 % EX CREA: TOPICAL_CREAM | CUTANEOUS | 1 refills | Status: DC

## 2021-11-23 NOTE — Telephone Encounter (Signed)
Meds refilled.

## 2021-11-23 NOTE — Telephone Encounter (Signed)
Medication: hydroquinone 4 % cream  tretinoin (RETIN-A) 0.05 % cream   Has the patient contacted their pharmacy? Yes.    Preferred Pharmacy (with phone number or street name[id number] [address] Phone:  [phone] Fax:  [phone]  Agent: Please be advised that RX refills may take up to 3 business days. We ask that you follow-up with your pharmacy.

## 2021-11-28 ENCOUNTER — Telehealth: Payer: Self-pay | Admitting: Family Medicine

## 2021-11-28 NOTE — Telephone Encounter (Signed)
Pt called stating the following medication needed a PA initialized due to lack of ins coverage:  tretinoin (RETIN-A) 0.05 % cream [[id number]]

## 2021-11-29 NOTE — Telephone Encounter (Signed)
Authorization already on file for this request. Authorization starting on 04/17/2021 and ending on 04/16/2022.  Patient stated that pharmacy stated it needed a prior authorization.  Advised her that we will call pharmacy to verify.

## 2021-11-29 NOTE — Telephone Encounter (Signed)
[facility] and [facility] stated that [facility] got it to go thru, it was a billing issue.    Left message on machine for patient that rx went through at pharmacy and she should be ok to pick up.

## 2021-11-29 NOTE — Telephone Encounter (Signed)
Prior auth started via cover my meds.  Awaiting determination.  Key: [id number]

## 2021-12-11 NOTE — Patient Instructions (Signed)
It was great to see you again today, I will be in touch with your labs as soon as possible

## 2021-12-11 NOTE — Progress Notes (Unsigned)
[place] at [facility] [address] [place], [place] [address] [phone]  Date:  12/12/2021   Name:  Robyn Montes   DOB:  December 27, 1955   MRN:  [id number]  PCP:  Robyn Mclean, MD    Chief Complaint: No chief complaint on file.   History of Present Illness:  Robyn Montes is a 66 y.o. very pleasant female patient who presents with the following:  Patient seen today for physical exam- History of prediabetes, hypertension, hypothyroidism, hepatitis C status post curative treatment, memory loss, obesity   Most recent visit with myself was a virtual visit in July At that time she wanted to start back on [facility], she has stopped it temporarily due to life circumstances We started back on 2.5 mg in July  Shingrix Pap smear Tetanus appears to be due Flu shot Mammogram today Cologuard up-to-date  Lab work done in [employer], lipid, vitamin D, CBC, A1c, TSH  Lab Results  Component Value Date   TSH 1.84 06/16/2021    Lab Results  Component Value Date   HGBA1C 6.0 06/16/2021   Aricept Losartan/HCTZ Lovastatin Metformin 500 twice daily Toprol XL 50 Potassium 10 mill equivalents daily-?  Taking Levothyroxine 100 Mounjaro 2.5 Patient Active Problem List   Diagnosis Date Noted   Acne vulgaris 12/17/2020   Prediabetes 05/26/2019   Hepatitis B immune 04/04/2016   Hypothyroidism 10/01/2014   Memory loss 08/21/2014   Chronic hepatitis C ([place]) 08/04/2014   History of other specified conditions presenting hazards to health 08/04/2014   Palpitations 10/03/2013   Essential hypertension 10/03/2013   Biliary calculi 01/17/2013    Past Medical History:  Diagnosis Date   Anemia 1976, 1984, 2001, 2003   history of multiple transfusions (3); iron deficiency   Arthritis    hands, knees B; DDD lumbar   Blood transfusion without reported diagnosis    Family history of adverse reaction to anesthesia    pt's  mother and daughter both had significant heart rate drops with anesthesia, believes her mother may have had a mini stroke while under anesthesia   Family history of heart disease    Heart murmur    Hepatitis C 04/17/2013   acquired from blood transfusion; s/p [facility] treatment [facility] in [place].   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   Palpitations    Pre-diabetes    on metformin for prediabetes   Shortness of breath    Sleep apnea    tested over 20 year ago, does not use cpap per patient    Past Surgical History:  Procedure Laterality Date   LIVER BIOPSY      Social History   Tobacco Use   Smoking status: Never   Smokeless tobacco: Never  Vaping Use   Vaping Use: Never used  Substance Use Topics   Alcohol use: No   Drug use: No    Family History  Problem Relation Age of Onset   Heart disease Mother        heart attack and stroke; passed away in her 65s   Diabetes Mother    Hyperlipidemia Mother    Hypertension Mother    Stroke Mother    Heart disease Father        passed away in his 24s   Hyperlipidemia Father    Diabetes Brother    Heart disease Brother    Hypertension Brother  Diabetes Brother    Hypertension Sister    Breast cancer Maternal Aunt     Allergies  Allergen Reactions   Bee Venom Other (See Comments)    Unknown    Medication list has been reviewed and updated.  Current Outpatient Medications on File Prior to Visit  Medication Sig Dispense Refill   ACCU-CHEK GUIDE test strip USE TO CHECK BLOOD SUGAR 3 TIMES A DAY. DX CODE: E11.9 300 strip 1   Accu-Chek Softclix Lancets lancets USE TO CHECK BLOOD SUGAR 3 TIMES A DAY 300 each 1   Blood Glucose Monitoring Suppl (ACCU-CHEK GUIDE) w/Device KIT USE TO CHECK BLOOD SUGAR 3 TIMES A DAY 1 kit 0   Brimonidine Tartrate 0.025 % SOLN Place 1-2 drops into both eyes daily as needed (for dry eyes).     donepezil (ARICEPT) 5 MG tablet Take 1 tablet (5 mg total) by mouth at  bedtime. 90 tablet 3   EPINEPHrine 0.3 mg/0.3 mL IJ SOAJ injection Inject 0.3 mg into the skin once as needed for up to 1 dose for anaphylaxis. 2 each 3   hydroquinone 4 % cream APPLY TO AFFECTED AREA TOPICALLY DAILY FOR 2-3 WEEKS ONLY 28.35 g 1   Insulin Pen Needle (COMFORT EZ PEN NEEDLES) 32G X 5 MM MISC Use daily for Saxenda injection (Patient not taking: Reported on 06/07/2021) 100 each 3   losartan-hydrochlorothiazide (HYZAAR) 50-12.5 MG tablet TAKE 1 TABLET BY MOUTH TWICE A DAY 180 tablet 3   lovastatin (MEVACOR) 20 MG tablet TAKE 1 TABLET BY MOUTH EVERYDAY AT BEDTIME 90 tablet 1   metFORMIN (GLUCOPHAGE) 500 MG tablet TAKE 1 TABLET BY MOUTH 2 TIMES DAILY WITH A MEAL. 180 tablet 3   metoprolol succinate (TOPROL-XL) 50 MG 24 hr tablet Take 1 tablet (50 mg total) by mouth daily. TAKE WITH OR IMMEDIATELY FOLLOWING A MEAL. 90 tablet 3   Naphazoline HCl (CLEAR EYES OP) Place 2 drops into both eyes daily as needed (for dry eyes).     potassium chloride (K-DUR) 10 MEQ tablet Take 1 tablet (10 mEq total) by mouth daily. (Patient not taking: Reported on 06/20/2021) 30 tablet 1   SYNTHROID 100 MCG tablet TAKE 1 TABLET BY MOUTH EVERY DAY BEFORE BREAKFAST 90 tablet 1   tirzepatide (MOUNJARO) 2.5 MG/0.5ML Pen Inject 2.5 mg into the skin once a week. 2 mL 3   tretinoin (RETIN-A) 0.05 % cream Apply topically at bedtime as needed. 45 g 0   No current facility-administered medications on file prior to visit.    Review of Systems:  As per HPI- otherwise negative.   Physical Examination: There were no vitals filed for this visit. There were no vitals filed for this visit. There is no height or weight on file to calculate BMI. Ideal Body Weight:    GEN: no acute distress. HEENT: Atraumatic, Normocephalic.  Ears and Nose: No external deformity. CV: RRR, No M/G/R. No JVD. No thrill. No extra heart sounds. PULM: CTA B, no wheezes, crackles, rhonchi. No retractions. No resp. distress. No accessory muscle  use. ABD: S, NT, ND, +BS. No rebound. No HSM. EXTR: No c/c/e PSYCH: Normally interactive. Conversant.    Assessment and Plan: *** Physical exam today.  Encouraged healthy diet and exercise routine Will plan further follow- up pending labs.  Signed Lamar Blinks, MD

## 2021-12-12 ENCOUNTER — Inpatient Hospital Stay (HOSPITAL_BASED_OUTPATIENT_CLINIC_OR_DEPARTMENT_OTHER): Admission: RE | Admit: 2021-12-12 | Payer: Medicare HMO | Source: Ambulatory Visit

## 2021-12-12 ENCOUNTER — Ambulatory Visit (INDEPENDENT_AMBULATORY_CARE_PROVIDER_SITE_OTHER): Payer: Self-pay | Admitting: Family Medicine

## 2021-12-12 DIAGNOSIS — R7303 Prediabetes: Secondary | ICD-10-CM

## 2021-12-12 DIAGNOSIS — I10 Essential (primary) hypertension: Secondary | ICD-10-CM

## 2021-12-12 DIAGNOSIS — Z91199 Patient's noncompliance with other medical treatment and regimen due to unspecified reason: Secondary | ICD-10-CM

## 2021-12-12 DIAGNOSIS — Z0000 Encounter for general adult medical examination without abnormal findings: Secondary | ICD-10-CM

## 2021-12-12 DIAGNOSIS — R413 Other amnesia: Secondary | ICD-10-CM

## 2021-12-13 NOTE — Progress Notes (Signed)
Same day cancellation

## 2021-12-24 NOTE — Progress Notes (Deleted)
[place] at [facility] [address], [place], [place] [address] [phone]  Date:  12/26/2021   Name:  Robyn Montes   DOB:  05-30-1955   MRN:  [id number]  PCP:  Darreld Mclean, MD    Chief Complaint: No chief complaint on file.   History of Present Illness:  Robyn Montes is a 66 y.o. very pleasant female patient who presents with the following:  Patient seen today for physical-History of prediabetes, hypertension, hypothyroidism, hepatitis C status post curative treatment, memory loss  Most recent visit with myself was a virtual visit in July; at that time we restarted Mounjaro.  She had stopped taking it temporarily due to traveling and some illness in her family We also restarted the Aricept that she takes for memory loss  Shingrix Pap smear appears to be due Can update tetanus Flu vaccine  Labs done in March-CMP, cholesterol, vitamin D, CBC, A1c, TSH Prediabetes range, everything else looks good  Aricept 5 daily Losartan/HCTZ Lovastatin Metformin Toprol-XL Levothyroxine 100 Mounjaro 2.5 Patient Active Problem List   Diagnosis Date Noted   Acne vulgaris 12/17/2020   Prediabetes 05/26/2019   Hepatitis B immune 04/04/2016   Hypothyroidism 10/01/2014   Memory loss 08/21/2014   Chronic hepatitis C ([place]) 08/04/2014   History of other specified conditions presenting hazards to health 08/04/2014   Palpitations 10/03/2013   Essential hypertension 10/03/2013   Biliary calculi 01/17/2013    Past Medical History:  Diagnosis Date   Anemia 1976, 1984, 2001, 2003   history of multiple transfusions (3); iron deficiency   Arthritis    hands, knees B; DDD lumbar   Blood transfusion without reported diagnosis    Family history of adverse reaction to anesthesia    pt's mother and daughter both had significant heart rate drops with anesthesia, believes her mother may have had a mini stroke while under  anesthesia   Family history of heart disease    Heart murmur    Hepatitis C 04/17/2013   acquired from blood transfusion; s/p [facility] treatment [facility] in [place].   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   Palpitations    Pre-diabetes    on metformin for prediabetes   Shortness of breath    Sleep apnea    tested over 20 year ago, does not use cpap per patient    Past Surgical History:  Procedure Laterality Date   LIVER BIOPSY      Social History   Tobacco Use   Smoking status: Never   Smokeless tobacco: Never  Vaping Use   Vaping Use: Never used  Substance Use Topics   Alcohol use: No   Drug use: No    Family History  Problem Relation Age of Onset   Heart disease Mother        heart attack and stroke; passed away in her 60s   Diabetes Mother    Hyperlipidemia Mother    Hypertension Mother    Stroke Mother    Heart disease Father        passed away in his 69s   Hyperlipidemia Father    Diabetes Brother    Heart disease Brother    Hypertension Brother    Diabetes Brother    Hypertension Sister    Breast cancer Maternal Aunt     Allergies  Allergen Reactions   Bee Venom Other (See Comments)  Unknown    Medication list has been reviewed and updated.  Current Outpatient Medications on File Prior to Visit  Medication Sig Dispense Refill   ACCU-CHEK GUIDE test strip USE TO CHECK BLOOD SUGAR 3 TIMES A DAY. DX CODE: E11.9 300 strip 1   Accu-Chek Softclix Lancets lancets USE TO CHECK BLOOD SUGAR 3 TIMES A DAY 300 each 1   Blood Glucose Monitoring Suppl (ACCU-CHEK GUIDE) w/Device KIT USE TO CHECK BLOOD SUGAR 3 TIMES A DAY 1 kit 0   Brimonidine Tartrate 0.025 % SOLN Place 1-2 drops into both eyes daily as needed (for dry eyes).     donepezil (ARICEPT) 5 MG tablet Take 1 tablet (5 mg total) by mouth at bedtime. 90 tablet 3   EPINEPHrine 0.3 mg/0.3 mL IJ SOAJ injection Inject 0.3 mg into the skin once as needed for up to 1 dose for  anaphylaxis. 2 each 3   hydroquinone 4 % cream APPLY TO AFFECTED AREA TOPICALLY DAILY FOR 2-3 WEEKS ONLY 28.35 g 1   Insulin Pen Needle (COMFORT EZ PEN NEEDLES) 32G X 5 MM MISC Use daily for Saxenda injection (Patient not taking: Reported on 06/07/2021) 100 each 3   losartan-hydrochlorothiazide (HYZAAR) 50-12.5 MG tablet TAKE 1 TABLET BY MOUTH TWICE A DAY 180 tablet 3   lovastatin (MEVACOR) 20 MG tablet TAKE 1 TABLET BY MOUTH EVERYDAY AT BEDTIME 90 tablet 1   metFORMIN (GLUCOPHAGE) 500 MG tablet TAKE 1 TABLET BY MOUTH 2 TIMES DAILY WITH A MEAL. 180 tablet 3   metoprolol succinate (TOPROL-XL) 50 MG 24 hr tablet Take 1 tablet (50 mg total) by mouth daily. TAKE WITH OR IMMEDIATELY FOLLOWING A MEAL. 90 tablet 3   Naphazoline HCl (CLEAR EYES OP) Place 2 drops into both eyes daily as needed (for dry eyes).     potassium chloride (K-DUR) 10 MEQ tablet Take 1 tablet (10 mEq total) by mouth daily. (Patient not taking: Reported on 06/20/2021) 30 tablet 1   SYNTHROID 100 MCG tablet TAKE 1 TABLET BY MOUTH EVERY DAY BEFORE BREAKFAST 90 tablet 1   tirzepatide (MOUNJARO) 2.5 MG/0.5ML Pen Inject 2.5 mg into the skin once a week. 2 mL 3   tretinoin (RETIN-A) 0.05 % cream Apply topically at bedtime as needed. 45 g 0   No current facility-administered medications on file prior to visit.    Review of Systems:  As per HPI- otherwise negative.   Physical Examination: There were no vitals filed for this visit. There were no vitals filed for this visit. There is no height or weight on file to calculate BMI. Ideal Body Weight:    GEN: no acute distress. HEENT: Atraumatic, Normocephalic.  Ears and Nose: No external deformity. CV: RRR, No M/G/R. No JVD. No thrill. No extra heart sounds. PULM: CTA B, no wheezes, crackles, rhonchi. No retractions. No resp. distress. No accessory muscle use. ABD: S, NT, ND, +BS. No rebound. No HSM. EXTR: No c/c/e PSYCH: Normally interactive. Conversant.    Assessment and  Plan: *** Physical exam today.  Encouraged healthy diet and exercise routine Will plan further follow- up pending labs.  Signed Lamar Blinks, MD

## 2021-12-26 ENCOUNTER — Encounter: Payer: Medicare HMO | Admitting: Family Medicine

## 2021-12-26 ENCOUNTER — Ambulatory Visit (HOSPITAL_BASED_OUTPATIENT_CLINIC_OR_DEPARTMENT_OTHER): Payer: Medicare HMO

## 2021-12-26 DIAGNOSIS — R7303 Prediabetes: Secondary | ICD-10-CM

## 2021-12-26 DIAGNOSIS — I10 Essential (primary) hypertension: Secondary | ICD-10-CM

## 2021-12-26 DIAGNOSIS — E038 Other specified hypothyroidism: Secondary | ICD-10-CM

## 2021-12-26 DIAGNOSIS — Z130 Encounter for screening for diseases of the blood and blood-forming organs and certain disorders involving the immune mechanism: Secondary | ICD-10-CM

## 2021-12-26 DIAGNOSIS — Z0000 Encounter for general adult medical examination without abnormal findings: Secondary | ICD-10-CM

## 2022-01-05 ENCOUNTER — Encounter: Payer: Medicare HMO | Admitting: Family Medicine

## 2022-01-09 NOTE — Progress Notes (Unsigned)
[place] at [facility] [address], [place], [place] [address] [phone]  Date:  01/11/2022   Name:  Robyn Montes   DOB:  Apr 05, 1956   MRN:  [id number]  PCP:  Robyn Mclean, MD    Chief Complaint: No chief complaint on file.   History of Present Illness:  Robyn Montes is a 66 y.o. very pleasant female patient who presents with the following:  Patient seen today for physical exam History of prediabetes, hypertension, hypothyroidism, hepatitis C status post curative treatment, memory loss Most recent visit with myself was a virtual visit in July; she had been taking Mounjaro but had a gap due to life circumstance.  We restarted Mounjaro in July I also had her start back on Aricept at that time  Shingrix Pap may be due for update Tetanus Flu shot Mammogram done today Can update labs today if she would like Patient Active Problem List   Diagnosis Date Noted   Acne vulgaris 12/17/2020   Prediabetes 05/26/2019   Hepatitis B immune 04/04/2016   Hypothyroidism 10/01/2014   Memory loss 08/21/2014   Chronic hepatitis C ([place]) 08/04/2014   History of other specified conditions presenting hazards to health 08/04/2014   Palpitations 10/03/2013   Essential hypertension 10/03/2013   Biliary calculi 01/17/2013    Past Medical History:  Diagnosis Date   Anemia 1976, 1984, 2001, 2003   history of multiple transfusions (3); iron deficiency   Arthritis    hands, knees B; DDD lumbar   Blood transfusion without reported diagnosis    Family history of adverse reaction to anesthesia    pt's mother and daughter both had significant heart rate drops with anesthesia, believes her mother may have had a mini stroke while under anesthesia   Family history of heart disease    Heart murmur    Hepatitis C 04/17/2013   acquired from blood transfusion; s/p [facility] treatment [facility] in [place].   HTN (hypertension)     Hypothyroid    Memory loss 08/21/2014   Palpitations    Pre-diabetes    on metformin for prediabetes   Shortness of breath    Sleep apnea    tested over 20 year ago, does not use cpap per patient    Past Surgical History:  Procedure Laterality Date   LIVER BIOPSY      Social History   Tobacco Use   Smoking status: Never   Smokeless tobacco: Never  Vaping Use   Vaping Use: Never used  Substance Use Topics   Alcohol use: No   Drug use: No    Family History  Problem Relation Age of Onset   Heart disease Mother        heart attack and stroke; passed away in her 64s   Diabetes Mother    Hyperlipidemia Mother    Hypertension Mother    Stroke Mother    Heart disease Father        passed away in his 15s   Hyperlipidemia Father    Diabetes Brother    Heart disease Brother    Hypertension Brother    Diabetes Brother    Hypertension Sister    Breast cancer Maternal Aunt     Allergies  Allergen Reactions   Bee Venom Other (See Comments)    Unknown    Medication list has been reviewed and updated.  Current Outpatient Medications on File Prior to Visit  Medication  Sig Dispense Refill   ACCU-CHEK GUIDE test strip USE TO CHECK BLOOD SUGAR 3 TIMES A DAY. DX CODE: E11.9 300 strip 1   Accu-Chek Softclix Lancets lancets USE TO CHECK BLOOD SUGAR 3 TIMES A DAY 300 each 1   Blood Glucose Monitoring Suppl (ACCU-CHEK GUIDE) w/Device KIT USE TO CHECK BLOOD SUGAR 3 TIMES A DAY 1 kit 0   Brimonidine Tartrate 0.025 % SOLN Place 1-2 drops into both eyes daily as needed (for dry eyes).     donepezil (ARICEPT) 5 MG tablet Take 1 tablet (5 mg total) by mouth at bedtime. 90 tablet 3   EPINEPHrine 0.3 mg/0.3 mL IJ SOAJ injection Inject 0.3 mg into the skin once as needed for up to 1 dose for anaphylaxis. 2 each 3   hydroquinone 4 % cream APPLY TO AFFECTED AREA TOPICALLY DAILY FOR 2-3 WEEKS ONLY 28.35 g 1   Insulin Pen Needle (COMFORT EZ PEN NEEDLES) 32G X 5 MM MISC Use daily for Saxenda  injection (Patient not taking: Reported on 06/07/2021) 100 each 3   losartan-hydrochlorothiazide (HYZAAR) 50-12.5 MG tablet TAKE 1 TABLET BY MOUTH TWICE A DAY 180 tablet 3   lovastatin (MEVACOR) 20 MG tablet TAKE 1 TABLET BY MOUTH EVERYDAY AT BEDTIME 90 tablet 1   metFORMIN (GLUCOPHAGE) 500 MG tablet TAKE 1 TABLET BY MOUTH 2 TIMES DAILY WITH A MEAL. 180 tablet 3   metoprolol succinate (TOPROL-XL) 50 MG 24 hr tablet Take 1 tablet (50 mg total) by mouth daily. TAKE WITH OR IMMEDIATELY FOLLOWING A MEAL. 90 tablet 3   Naphazoline HCl (CLEAR EYES OP) Place 2 drops into both eyes daily as needed (for dry eyes).     potassium chloride (K-DUR) 10 MEQ tablet Take 1 tablet (10 mEq total) by mouth daily. (Patient not taking: Reported on 06/20/2021) 30 tablet 1   SYNTHROID 100 MCG tablet TAKE 1 TABLET BY MOUTH EVERY DAY BEFORE BREAKFAST 90 tablet 1   tirzepatide (MOUNJARO) 2.5 MG/0.5ML Pen Inject 2.5 mg into the skin once a week. 2 mL 3   tretinoin (RETIN-A) 0.05 % cream Apply topically at bedtime as needed. 45 g 0   No current facility-administered medications on file prior to visit.    Review of Systems:  As per HPI- otherwise negative.   Physical Examination: There were no vitals filed for this visit. There were no vitals filed for this visit. There is no height or weight on file to calculate BMI. Ideal Body Weight:    GEN: no acute distress. HEENT: Atraumatic, Normocephalic.  Ears and Nose: No external deformity. CV: RRR, No M/G/R. No JVD. No thrill. No extra heart sounds. PULM: CTA B, no wheezes, crackles, rhonchi. No retractions. No resp. distress. No accessory muscle use. ABD: S, NT, ND, +BS. No rebound. No HSM. EXTR: No c/c/e PSYCH: Normally interactive. Conversant.    Assessment and Plan: ***  Signed Lamar Blinks, MD

## 2022-01-11 ENCOUNTER — Encounter: Payer: Self-pay | Admitting: Family Medicine

## 2022-01-11 ENCOUNTER — Other Ambulatory Visit (HOSPITAL_COMMUNITY)
Admission: RE | Admit: 2022-01-11 | Discharge: 2022-01-11 | Disposition: A | Discharge status: Home / Self Care | Payer: Medicare HMO | Source: Ambulatory Visit | Attending: Family Medicine | Admitting: Family Medicine

## 2022-01-11 ENCOUNTER — Encounter (HOSPITAL_BASED_OUTPATIENT_CLINIC_OR_DEPARTMENT_OTHER): Payer: Self-pay

## 2022-01-11 ENCOUNTER — Ambulatory Visit (INDEPENDENT_AMBULATORY_CARE_PROVIDER_SITE_OTHER): Payer: Medicare HMO | Admitting: Family Medicine

## 2022-01-11 ENCOUNTER — Ambulatory Visit (HOSPITAL_BASED_OUTPATIENT_CLINIC_OR_DEPARTMENT_OTHER)
Admission: RE | Admit: 2022-01-11 | Discharge: 2022-01-11 | Disposition: A | Discharge status: Home / Self Care | Payer: Medicare HMO | Source: Ambulatory Visit | Attending: Family Medicine | Admitting: Family Medicine

## 2022-01-11 VITALS — BP 122/80 | HR 81 | Temp 97.7°F | Resp 18 | Ht 64.5 in | Wt 208.8 lb

## 2022-01-11 DIAGNOSIS — I10 Essential (primary) hypertension: Secondary | ICD-10-CM | POA: Diagnosis not present

## 2022-01-11 DIAGNOSIS — Z1151 Encounter for screening for human papillomavirus (HPV): Secondary | ICD-10-CM | POA: Insufficient documentation

## 2022-01-11 DIAGNOSIS — E039 Hypothyroidism, unspecified: Secondary | ICD-10-CM

## 2022-01-11 DIAGNOSIS — Z23 Encounter for immunization: Secondary | ICD-10-CM | POA: Diagnosis not present

## 2022-01-11 DIAGNOSIS — R7303 Prediabetes: Secondary | ICD-10-CM

## 2022-01-11 DIAGNOSIS — Z1231 Encounter for screening mammogram for malignant neoplasm of breast: Secondary | ICD-10-CM | POA: Diagnosis not present

## 2022-01-11 DIAGNOSIS — Z124 Encounter for screening for malignant neoplasm of cervix: Secondary | ICD-10-CM

## 2022-01-11 DIAGNOSIS — Z0000 Encounter for general adult medical examination without abnormal findings: Secondary | ICD-10-CM

## 2022-01-11 DIAGNOSIS — Z13220 Encounter for screening for lipoid disorders: Secondary | ICD-10-CM | POA: Diagnosis not present

## 2022-01-11 DIAGNOSIS — Z01419 Encounter for gynecological examination (general) (routine) without abnormal findings: Secondary | ICD-10-CM | POA: Insufficient documentation

## 2022-01-11 MED ORDER — TIRZEPATIDE 5 MG/0.5ML ~~LOC~~ SOAJ: 5.0000 mg | SUBCUTANEOUS | 3 refills | Status: DC

## 2022-01-11 NOTE — Patient Instructions (Signed)
Good to see you today!  I will be in touch with your labs and pap Flu shot today I would recommend getting the new covid vaccine and shingles series at your pharmacy if not done yet Please check on your last tetanus- if 10 years or more in the past you need a booster!    Will increase Mounjaro to 5 mg for you

## 2022-01-12 ENCOUNTER — Encounter: Payer: Self-pay | Admitting: Family Medicine

## 2022-01-12 ENCOUNTER — Other Ambulatory Visit: Payer: Self-pay | Admitting: Family Medicine

## 2022-01-12 DIAGNOSIS — L578 Other skin changes due to chronic exposure to nonionizing radiation: Secondary | ICD-10-CM

## 2022-01-12 LAB — LIPID PANEL
Cholesterol: 178 mg/dL (ref 0–200)
HDL: 38.6 mg/dL — ABNORMAL LOW (ref >39.00)
LDL Cholesterol: 119 mg/dL — ABNORMAL HIGH (ref 0–99)
NonHDL: 139.24
Total CHOL/HDL Ratio: 5
Triglycerides: 101 mg/dL (ref 0.0–149.0)
VLDL: 20.2 mg/dL (ref 0.0–40.0)

## 2022-01-12 LAB — BASIC METABOLIC PANEL
BUN: 10 mg/dL (ref 6–23)
CO2: 31 mEq/L (ref 19–32)
Calcium: 9.9 mg/dL (ref 8.4–10.5)
Chloride: 103 mEq/L (ref 96–112)
Creatinine, Ser: 0.88 mg/dL (ref 0.40–1.20)
GFR: 69.27 mL/min (ref >60.00)
Glucose, Bld: 78 mg/dL (ref 70–99)
Potassium: 4.3 mEq/L (ref 3.5–5.1)
Sodium: 142 mEq/L (ref 135–145)

## 2022-01-12 LAB — HEMOGLOBIN A1C: Hgb A1c MFr Bld: 6 % (ref 4.6–6.5)

## 2022-01-12 LAB — TSH: TSH: 0.85 u[IU]/mL (ref 0.35–5.50)

## 2022-01-13 ENCOUNTER — Telehealth: Payer: Self-pay

## 2022-01-13 ENCOUNTER — Other Ambulatory Visit: Payer: Self-pay

## 2022-01-13 DIAGNOSIS — L578 Other skin changes due to chronic exposure to nonionizing radiation: Secondary | ICD-10-CM

## 2022-01-13 MED ORDER — HYDROQUINONE 4 % EX CREA: TOPICAL_CREAM | CUTANEOUS | 1 refills | Status: DC

## 2022-01-13 NOTE — Telephone Encounter (Signed)
Called the pharmacy to let them know that the PA on her cream is good until 04/16/22. She said "sometimes it just needs to be updated.    So I went to covermymeds to try to start a PA. The message read: "Information regarding your request Authorization already on file for this request. Authorization starting on 04/17/2021 and ending on 04/16/2022."   I called the pharmacy back to inform them, the issue was the quantity of the tube- they were able to get this to go through.

## 2022-01-17 ENCOUNTER — Encounter: Payer: Self-pay | Admitting: Family Medicine

## 2022-01-17 LAB — CYTOLOGY - PAP
Comment: NEGATIVE
Diagnosis: NEGATIVE
High risk HPV: NEGATIVE

## 2022-03-10 ENCOUNTER — Other Ambulatory Visit: Payer: Self-pay | Admitting: Family Medicine

## 2022-03-17 ENCOUNTER — Other Ambulatory Visit: Payer: Self-pay | Admitting: Family Medicine

## 2022-03-17 DIAGNOSIS — E038 Other specified hypothyroidism: Secondary | ICD-10-CM

## 2022-04-12 ENCOUNTER — Other Ambulatory Visit: Payer: Self-pay | Admitting: Family Medicine

## 2022-04-12 DIAGNOSIS — L578 Other skin changes due to chronic exposure to nonionizing radiation: Secondary | ICD-10-CM

## 2022-05-13 ENCOUNTER — Other Ambulatory Visit: Payer: Self-pay | Admitting: Family Medicine

## 2022-05-15 ENCOUNTER — Telehealth: Payer: Self-pay

## 2022-05-15 NOTE — Telephone Encounter (Signed)
PA initiated via Covermymeds; KEY: [id number]. Awaiting determination.

## 2022-05-15 NOTE — Telephone Encounter (Signed)
PA approved.   PA Case: [id number], Status: Approved, Coverage Starts on: 04/17/2022 12:00:00 AM, Coverage Ends on: 04/17/2023 12:00:00 AM. Questions? Contact [phone].

## 2022-05-24 ENCOUNTER — Telehealth: Payer: Self-pay | Admitting: Family Medicine

## 2022-05-24 NOTE — Telephone Encounter (Signed)
Patient called to advise that her blood pressure spiked and she experienced tightness in her chest, numbness in her left arm and back. She took another one of her pills and it started to come down. Patient transferred to triage.

## 2022-05-24 NOTE — Telephone Encounter (Signed)
Spoke with patient she req CB end 05/2022 to sched her AWV

## 2022-05-24 NOTE — Telephone Encounter (Signed)
Initial Comment Caller states she has pain on her left side and upper back with numbness in left arm. Caller provided the following blood pressure readings 115/69 and 187/92, and has chest pain with light headedness. Caller did mention she was transferred due to no available appts. Translation No Nurse Assessment Nurse: Ronnald Ramp, RN, Miranda Date/Time (Eastern Time): 05/24/2022 2:38:45 PM Confirm and document reason for call. If symptomatic, describe symptoms. ---Caller states she is having pain in her left side/upper back with numbness in her left arm or left foot. This has been going on for weeks. Today, she had sharp pain in her chest for a few minutes and dizziness. Her BP went up to 187/92. Her most recent BP was 155/87. She is not having any chest pain. Does the patient have any new or worsening symptoms? ---Yes Will a triage be completed? ---Yes Related visit to physician within the last 2 weeks? ---No Does the PT have any chronic conditions? (i.e. diabetes, asthma, this includes High risk factors for pregnancy, etc.) ---Yes List chronic conditions. ---HTN, Thyroid, Diabetes, High Cholesterol Is this a behavioral health or substance abuse call? ---No Guidelines Guideline Title Affirmed Question Affirmed Notes Nurse Date/Time (Eastern Time) Dizziness - Vertigo [1] MODERATE dizziness (e.g., vertigo; feels very unsteady, Ronnald Ramp, RN, Miranda 05/24/2022 2:52:51 PM PLEASE NOTE: All timestamps contained within this report are represented as [place] Standard Time. CONFIDENTIALTY NOTICE: This fax transmission is intended only for the addressee. It contains information that is legally privileged, confidential or otherwise protected from use or disclosure. If you are not the intended recipient, you are strictly prohibited from reviewing, disclosing, copying using or disseminating any of this information or taking any action in reliance on or regarding this information. If you have received  this fax in error, please notify us immediately by telephone so that we can arrange for its return to [place]. Phone: [phone], Toll-Free: [phone], Fax: [phone] Page: 2 of 2 Call Id: [id number] Guidelines Guideline Title Affirmed Question Affirmed Notes Nurse Date/Time Eilene Ghazi Time) interferes with normal activities) AND [2] has NOT been evaluated by doctor (or NP/PA) for this Disp. Time Eilene Ghazi Time) Disposition Final User 05/24/2022 2:35:03 PM Send to Urgent Queue Merwyn Katos 05/24/2022 3:01:15 PM Go to ED Now (or PCP triage) Yes Ronnald Ramp, RN, Miranda Final Disposition 05/24/2022 3:01:15 PM Go to ED Now (or PCP triage) Yes Ronnald Ramp, RN, Miranda Disposition Overriden: See PCP within 24 Hours Override Reason: Specify reason. (Please document in 'advice recommended' section) Caller Disagree/Comply Comply Caller Understands Yes PreDisposition Call Doctor Care Advice Given Per Guideline GO TO ED NOW (OR PCP TRIAGE): * IF NO PCP (PRIMARY CARE PROVIDER) SECOND-LEVEL TRIAGE: You need to be seen within the next hour. Go to the [facility] at _____________ [place] as soon as you can. NOTE TO TRIAGER - DRIVING: * Another adult should drive. ANOTHER ADULT SHOULD DRIVE: * It is better and safer if another adult drives instead of you. Comments User: Leverne Humbles, RN Date/Time (Eastern Time): 05/24/2022 2:46:57 PM After initial assessment, she states she is also having pain in right upper stomach off and on for the lest several months. User: Leverne Humbles, RN Date/Time Eilene Ghazi Time): 05/24/2022 2:50:31 PM She is not currently having back pain. It comes and goes. When she has the pain it is a 10/10 pain scale. When she has the pain it last 10-15 min. She is not having currently having numbness. It comes and goes as well. The last time she had numbness in left arm and foot  was earlier this morning. Referrals GO TO FACILITY UNDECIDED

## 2022-05-25 ENCOUNTER — Other Ambulatory Visit: Payer: Self-pay

## 2022-05-25 ENCOUNTER — Emergency Department (HOSPITAL_BASED_OUTPATIENT_CLINIC_OR_DEPARTMENT_OTHER)
Admission: EM | Admit: 2022-05-25 | Discharge: 2022-05-25 | Disposition: A | Discharge status: Home / Self Care | Payer: Medicare HMO | Attending: Emergency Medicine | Admitting: Emergency Medicine

## 2022-05-25 ENCOUNTER — Emergency Department (HOSPITAL_BASED_OUTPATIENT_CLINIC_OR_DEPARTMENT_OTHER): Payer: Medicare HMO

## 2022-05-25 ENCOUNTER — Encounter (HOSPITAL_BASED_OUTPATIENT_CLINIC_OR_DEPARTMENT_OTHER): Payer: Self-pay

## 2022-05-25 DIAGNOSIS — Z7984 Long term (current) use of oral hypoglycemic drugs: Secondary | ICD-10-CM | POA: Insufficient documentation

## 2022-05-25 DIAGNOSIS — R079 Chest pain, unspecified: Secondary | ICD-10-CM | POA: Diagnosis not present

## 2022-05-25 DIAGNOSIS — R0789 Other chest pain: Secondary | ICD-10-CM | POA: Diagnosis not present

## 2022-05-25 DIAGNOSIS — Z79899 Other long term (current) drug therapy: Secondary | ICD-10-CM | POA: Diagnosis not present

## 2022-05-25 DIAGNOSIS — I10 Essential (primary) hypertension: Secondary | ICD-10-CM | POA: Insufficient documentation

## 2022-05-25 DIAGNOSIS — E119 Type 2 diabetes mellitus without complications: Secondary | ICD-10-CM | POA: Insufficient documentation

## 2022-05-25 DIAGNOSIS — R202 Paresthesia of skin: Secondary | ICD-10-CM | POA: Diagnosis not present

## 2022-05-25 DIAGNOSIS — Z794 Long term (current) use of insulin: Secondary | ICD-10-CM | POA: Insufficient documentation

## 2022-05-25 DIAGNOSIS — R200 Anesthesia of skin: Secondary | ICD-10-CM

## 2022-05-25 DIAGNOSIS — R42 Dizziness and giddiness: Secondary | ICD-10-CM | POA: Diagnosis not present

## 2022-05-25 LAB — URINALYSIS, ROUTINE W REFLEX MICROSCOPIC
Bilirubin Urine: NEGATIVE
Glucose, UA: NEGATIVE mg/dL
Hgb urine dipstick: NEGATIVE
Ketones, ur: 15 mg/dL — AB
Leukocytes,Ua: NEGATIVE
Nitrite: NEGATIVE
Protein, ur: NEGATIVE mg/dL
Specific Gravity, Urine: >=1.03 (ref 1.005–1.030)
pH: 5 (ref 5.0–8.0)

## 2022-05-25 LAB — CBC
HCT: 38.9 % (ref 36.0–46.0)
Hemoglobin: 12.5 g/dL (ref 12.0–15.0)
MCH: 26.2 pg (ref 26.0–34.0)
MCHC: 32.1 g/dL (ref 30.0–36.0)
MCV: 81.6 fL (ref 80.0–100.0)
Platelets: 239 10*3/uL (ref 150–400)
RBC: 4.77 MIL/uL (ref 3.87–5.11)
RDW: 14.9 % (ref 11.5–15.5)
WBC: 10.7 10*3/uL — ABNORMAL HIGH (ref 4.0–10.5)
nRBC: 0 % (ref 0.0–0.2)

## 2022-05-25 LAB — BASIC METABOLIC PANEL
Anion gap: 9 (ref 5–15)
BUN: 16 mg/dL (ref 8–23)
CO2: 25 mmol/L (ref 22–32)
Calcium: 9.3 mg/dL (ref 8.9–10.3)
Chloride: 102 mmol/L (ref 98–111)
Creatinine, Ser: 0.88 mg/dL (ref 0.44–1.00)
GFR, Estimated: >60 mL/min (ref >60)
Glucose, Bld: 94 mg/dL (ref 70–99)
Potassium: 3.7 mmol/L (ref 3.5–5.1)
Sodium: 136 mmol/L (ref 135–145)

## 2022-05-25 LAB — TROPONIN I (HIGH SENSITIVITY)
Troponin I (High Sensitivity): 4 ng/L (ref <18)
Troponin I (High Sensitivity): 4 ng/L (ref <18)

## 2022-05-25 NOTE — ED Triage Notes (Signed)
C/o chest pain, dizziness, blurred since yesterday morning, numbness to left arm x 1.5 weeks.

## 2022-05-25 NOTE — Discharge Instructions (Signed)
You were seen in the emergency department today for chest pain and numbness. You workup did not reveal a definite cause of your symptoms but was generally reassuring.   Return to the emergency department immediately if you develop recurrent, severe chest pain, shortness of breath, fainting spells, inability to move the arms or legs, complete loss of sensation, slurred speech, sudden sweatiness, or any other concerning symptoms.   Make an appointment with neurology team regarding your intermittent numbness and neurologic symptoms.  Make an appointment with cardiology regarding your chest pain that you were seen for today.  Please also make an appointment to follow up with your primary care doctor or cardiologist within one week to assure improvement or resolution in symptoms. Further testing may be necessary, so it is extremely important to keep your follow-up appointment with your primary doctor.

## 2022-05-25 NOTE — ED Notes (Signed)
Pt ambulatory to restroom with independent steady gait °

## 2022-05-25 NOTE — ED Provider Notes (Signed)
[facility] Provider Note   CSN: [id number] Arrival date & time: 05/25/22  1649     History  Chief Complaint  Patient presents with   Chest Pain   Numbness    Robyn Montes is a 67 y.o. female.  With PMH of HTN, thyroid disease, prediabetes who presents with chest pain and numbness which have since resolved.  Patient said yesterday afternoon she had atraumatic left-sided sharp chest pain that radiated to her back for approximately 10 minutes.  No alleviating or worsening factors.  It resolved on its own.  She checked her blood pressure after this event and noted it was high up to systolic [id number].  She is not currently having any pain now but when she called yesterday to the telephone triage nurse they advised she go to the ER.  She is also complaining of intermittent numbness and tingling in her left arm and left knee and foot.  She does not currently have any numbness or tingling.  This will be brief episodes on and off for the past 2 weeks.  She has intermittent headaches with the symptoms.  Intermittent associated lightheadedness with the symptoms.  No slurred speech or word finding difficulty.  Intermittent flashes of light in her eyes.  She has no history of MI or CVA.  She is currently asymptomatic.  No recent infectious symptoms.  No shortness of breath.   Chest Pain      Home Medications Prior to Admission medications   Medication Sig Start Date End Date Taking? Authorizing Provider  ACCU-CHEK GUIDE test strip USE TO CHECK BLOOD SUGAR 3 TIMES A DAY. DX CODE: E11.9 03/17/22   Copland, Gay Filler, MD  Accu-Chek Softclix Lancets lancets USE TO CHECK BLOOD SUGAR 3 TIMES A DAY 03/02/21   Copland, Gay Filler, MD  Blood Glucose Monitoring Suppl (ACCU-CHEK GUIDE) w/Device KIT USE TO CHECK BLOOD SUGAR 3 TIMES A DAY 06/27/21   Copland, Gay Filler, MD  Brimonidine Tartrate 0.025 % SOLN Place 1-2 drops into both eyes daily as needed (for dry eyes).     [provider]  donepezil (ARICEPT) 5 MG tablet Take 1 tablet (5 mg total) by mouth at bedtime. 11/09/21   Copland, Gay Filler, MD  EPINEPHrine 0.3 mg/0.3 mL IJ SOAJ injection Inject 0.3 mg into the skin once as needed for up to 1 dose for anaphylaxis. 11/09/21   Copland, Gay Filler, MD  hydroquinone 4 % cream APPLY TO AFFECTED AREA TOPICALLY DAILY FOR 2-3 WEEKS ONLY 04/12/22   Copland, Gay Filler, MD  Insulin Pen Needle (COMFORT EZ PEN NEEDLES) 32G X 5 MM MISC Use daily for Saxenda injection 01/05/20   Copland, Gay Filler, MD  losartan-hydrochlorothiazide (HYZAAR) 50-12.5 MG tablet TAKE 1 TABLET BY MOUTH TWICE A DAY 10/13/21   Copland, Gay Filler, MD  lovastatin (MEVACOR) 20 MG tablet TAKE 1 TABLET BY MOUTH EVERYDAY AT BEDTIME 10/17/21   Copland, Gay Filler, MD  metFORMIN (GLUCOPHAGE) 500 MG tablet TAKE 1 TABLET BY MOUTH 2 TIMES DAILY WITH A MEAL. 08/22/21   Copland, Gay Filler, MD  metoprolol succinate (TOPROL-XL) 50 MG 24 hr tablet Take 1 tablet (50 mg total) by mouth daily. TAKE WITH OR IMMEDIATELY FOLLOWING A MEAL. 07/29/20   Copland, Gay Filler, MD  Naphazoline HCl (CLEAR EYES OP) Place 2 drops into both eyes daily as needed (for dry eyes).    [provider]  potassium chloride (K-DUR) 10 MEQ tablet Take 1 tablet (10  mEq total) by mouth daily. 01/18/17   Tereasa Coop, PA-C  SYNTHROID 100 MCG tablet TAKE 1 TABLET BY MOUTH EVERY DAY BEFORE BREAKFAST 03/17/22   Copland, Gay Filler, MD  tirzepatide [facility]) 5 MG/0.5ML Pen Inject 5 mg into the skin once a week. 01/11/22   Copland, Gay Filler, MD  tretinoin (RETIN-A) 0.05 % cream APPLY TOPICALLY AT BEDTIME AS NEEDED. 01/13/22   Copland, Gay Filler, MD      Allergies    Bee venom    Review of Systems   Review of Systems  Cardiovascular:  Positive for chest pain.    Physical Exam Updated Vital Signs BP 115/72   Pulse 75   Temp 99.3 F (37.4 C) (Oral)   Resp 14   Ht 5' 4.5" (1.638 m)   Wt 93.9 kg   SpO2 96%   BMI 34.98 kg/m   Physical Exam Constitutional: Alert and oriented. Well appearing and in no distress. Eyes: Conjunctivae are normal. ENT      Head: Normocephalic and atraumatic.      Nose: No congestion.      Mouth/Throat: Mucous membranes are moist.      Neck: No stridor. Cardiovascular: S1, S2,  Normal and symmetric distal pulses are present in all extremities.Warm and well perfused. Respiratory: Normal respiratory effort. Breath sounds are normal. O2 sat 96 on RA Gastrointestinal: Soft  Musculoskeletal: Normal range of motion in all extremities. No pitting edema of LEs Neurologic: Normal speech and language without aphasia. AAOx4. PERRL. EOMI without nystagmus. Face symmetrical without droop. CN II-XII intact. Normal facial sensation. Tongue midline. No pronator drift. 5/5 strength in upper and lower extremities.  Normal sensation to light touch in all extremities. Normal gait. No focal neurologic deficits are appreciated. Skin: Skin is warm, dry and intact. No rash noted. Psychiatric: Mood and affect are normal. Speech and behavior are normal.  ED Results / Procedures / Treatments   Labs (all labs ordered are listed, but only abnormal results are displayed) Labs Reviewed  CBC - Abnormal; Notable for the following components:      Result Value   WBC 10.7 (*)    All other components within normal limits  URINALYSIS, ROUTINE W REFLEX MICROSCOPIC - Abnormal; Notable for the following components:   Ketones, ur 15 (*)    All other components within normal limits  BASIC METABOLIC PANEL  TROPONIN I (HIGH SENSITIVITY)  TROPONIN I (HIGH SENSITIVITY)    EKG EKG Interpretation  Date/Time:  Thursday May 25 2022 17:06:04 EST Ventricular Rate:  87 PR Interval:  141 QRS Duration: 94 QT Interval:  372 QTC Calculation: 448 R Axis:   -17 Text Interpretation: Sinus rhythm Inferior infarct, old Consider anterior infarct No significant change since last tracing Confirmed by Georgina Snell [phone])  on 05/25/2022 5:50:49 PM  Radiology CT Head Wo Contrast  Result Date: 05/25/2022 CLINICAL DATA:  Blurry vision.  Left upper extremity numbness. EXAM: CT HEAD WITHOUT CONTRAST TECHNIQUE: Contiguous axial images were obtained from the base of the skull through the vertex without intravenous contrast. RADIATION DOSE REDUCTION: This exam was performed according to the departmental dose-optimization program which includes automated exposure control, adjustment of the mA and/or kV according to patient size and/or use of iterative reconstruction technique. COMPARISON:  None Available. FINDINGS: Brain: No acute hemorrhage, mass effect or midline shift. Gray-white differentiation is preserved. No hydrocephalus. No extra-axial collection. Basilar cisterns are patent. Vascular: No hyperdense vessel or unexpected calcification. Skull: No calvarial fracture or suspicious bone lesion.  Skull base is unremarkable. Sinuses/Orbits: Paranasal sinuses, mastoid air cells, and middle ear cavities are well aerated. Orbits are unremarkable. Other: None. IMPRESSION: No acute intracranial abnormality. Electronically Signed   By: Emmit Alexanders M.D.   On: 05/25/2022 19:44   DG Chest 2 View  Result Date: 05/25/2022 CLINICAL DATA:  Chest pain, dizziness, and blurred vision since yesterday morning. Numbness left arm x1 0.5 weeks. EXAM: CHEST - 2 VIEW COMPARISON:  Chest two views 04/25/2018 FINDINGS: Cardiac silhouette and mediastinal contours are within normal limits. Mild calcification within the aortic arch. The lungs are clear. No pulmonary edema, pleural effusion, or pneumothorax. Mild dextrocurvature of the mid to lower thoracic spine. Mild multilevel degenerative disc and endplate changes. IMPRESSION: No active cardiopulmonary disease. Electronically Signed   By: Yvonne Kendall M.D.   On: 05/25/2022 17:32    Procedures Procedures  Remain on constant cardiac monitoring sinus rhythm with normal rates.  Medications Ordered in  ED Medications - No data to display  ED Course/ Medical Decision Making/ A&P   {             HEART Score: 4                Medical Decision Making Robyn Montes is a 67 y.o. female.  With PMH of HTN, thyroid disease, prediabetes who presents with chest pain and numbness which have since resolved.   Regarding the patient's chest pain, their HEART score is 4 and overall have an EKG which is reassuring and unchanged from previous and currently CP free. Will further evaluate for ACS with high-sensitivity troponin at least 3 hours from the patient's start of pain and both hs top 4 and flat and reassuring. Personally reviewed CXR which showed no evidence for pneumonia, pneumothorax, and pulmonary edema. I do not think aortic dissection as the patient is well-appearing, does not have ripping/tearing pain and equally has no pulse or neurologic deficits and no mediastinal widening.  Regarding patient's intermittent numbness and neurologic complaints, currently neurologically intact NIHSS 0, no evidence of CVA.  Consider possible TIA, metabolic disorder and electrolyte abnormality among multiple other etiologies.  Labs reviewed here with no acute electrolyte abnormalities.  Sodium 136 potassium 3.7, glucose 94.  No anemia hemoglobin 12.5.  CT head obtained with no acute intracranial pathology.  Recommended patient get CTA head and neck and possibly discussion with neurology for TIA workup however she is declining at this time any further workup.  She prefers outpatient follow-up with cardiology and neurology as she is asymptomatic and she must go home as her ride is here.  I discussed strict return precautions and close follow-up with PCP and specialists as listed above which she is in agreement with.  Amount and/or Complexity of Data Reviewed Labs: ordered. Radiology: ordered.    Final Clinical Impression(s) / ED Diagnoses Final diagnoses:  Chest pain, unspecified type  Numbness and tingling     Rx / DC Orders ED Discharge Orders          Ordered    Ambulatory referral to Cardiology       Comments: If you have not heard from the Cardiology office within the next 72 hours please call [phone].   05/25/22 2033    Ambulatory referral to Neurology       Comments: An appointment is requested in approximately: 1 week   05/25/22 2034              Elgie Congo, MD 05/26/22 1122

## 2022-06-23 ENCOUNTER — Encounter: Payer: Self-pay | Admitting: Family Medicine

## 2022-06-23 ENCOUNTER — Other Ambulatory Visit: Payer: Self-pay | Admitting: Family Medicine

## 2022-06-23 ENCOUNTER — Telehealth: Payer: Self-pay

## 2022-06-23 DIAGNOSIS — R7303 Prediabetes: Secondary | ICD-10-CM

## 2022-06-23 DIAGNOSIS — L578 Other skin changes due to chronic exposure to nonionizing radiation: Secondary | ICD-10-CM

## 2022-06-23 MED ORDER — TIRZEPATIDE 7.5 MG/0.5ML ~~LOC~~ SOAJ: 7.5000 mg | SUBCUTANEOUS | 1 refills | Status: DC

## 2022-06-23 NOTE — Addendum Note (Signed)
Addended by: Lamar Blinks C on: 06/23/2022 12:43 PM   Modules accepted: Orders

## 2022-06-23 NOTE — Telephone Encounter (Signed)
Pt called asking if she can have her Mounjaro increased to the '7mg'$  dose. She uses [facility].

## 2022-07-26 ENCOUNTER — Telehealth: Payer: Self-pay | Admitting: Family Medicine

## 2022-07-26 NOTE — Telephone Encounter (Signed)
Copied from CRM [id number]. Topic: Medicare AWV >> Jul 26, 2022  1:21 PM Payton Doughty wrote: Reason for CRM: Called patient to schedule Medicare Annual Wellness Visit (AWV). Left message for patient to call back and schedule Medicare Annual Wellness Visit (AWV).  Last date of AWV: 06/07/21  Please schedule an appointment at any time with Kandis Cocking, LPN .  If any questions, please contact me.  Thank you ,  Verlee Rossetti; Care Guide Ambulatory Clinical Support [facility] Direct Dial: [phone]

## 2022-08-04 ENCOUNTER — Telehealth: Payer: Self-pay

## 2022-08-04 NOTE — Telephone Encounter (Signed)
Pt aware and voices understanding.   

## 2022-08-04 NOTE — Telephone Encounter (Signed)
Pt says she has not had a BM in 1 week. She would like to know what she can try. I advised OTC Miralax. She wanted to know about a prescription. She minimal abd pain. Stools are dark.

## 2022-08-07 DIAGNOSIS — R1013 Epigastric pain: Secondary | ICD-10-CM | POA: Diagnosis not present

## 2022-08-07 DIAGNOSIS — H43813 Vitreous degeneration, bilateral: Secondary | ICD-10-CM | POA: Diagnosis not present

## 2022-08-07 DIAGNOSIS — R1011 Right upper quadrant pain: Secondary | ICD-10-CM | POA: Diagnosis not present

## 2022-08-07 DIAGNOSIS — K8020 Calculus of gallbladder without cholecystitis without obstruction: Secondary | ICD-10-CM | POA: Diagnosis not present

## 2022-08-07 DIAGNOSIS — R1032 Left lower quadrant pain: Secondary | ICD-10-CM | POA: Diagnosis not present

## 2022-08-07 DIAGNOSIS — K921 Melena: Secondary | ICD-10-CM | POA: Diagnosis not present

## 2022-08-07 DIAGNOSIS — H00012 Hordeolum externum right lower eyelid: Secondary | ICD-10-CM | POA: Diagnosis not present

## 2022-08-07 DIAGNOSIS — R1012 Left upper quadrant pain: Secondary | ICD-10-CM | POA: Diagnosis not present

## 2022-08-07 DIAGNOSIS — R9389 Abnormal findings on diagnostic imaging of other specified body structures: Secondary | ICD-10-CM | POA: Diagnosis not present

## 2022-08-07 DIAGNOSIS — R1084 Generalized abdominal pain: Secondary | ICD-10-CM | POA: Diagnosis not present

## 2022-08-07 DIAGNOSIS — K5900 Constipation, unspecified: Secondary | ICD-10-CM | POA: Diagnosis not present

## 2022-08-07 DIAGNOSIS — R509 Fever, unspecified: Secondary | ICD-10-CM | POA: Diagnosis not present

## 2022-08-07 DIAGNOSIS — H2513 Age-related nuclear cataract, bilateral: Secondary | ICD-10-CM | POA: Diagnosis not present

## 2022-08-16 ENCOUNTER — Other Ambulatory Visit: Payer: Self-pay | Admitting: Family Medicine

## 2022-08-16 DIAGNOSIS — R413 Other amnesia: Secondary | ICD-10-CM

## 2022-08-16 DIAGNOSIS — E038 Other specified hypothyroidism: Secondary | ICD-10-CM

## 2022-08-25 DIAGNOSIS — M47819 Spondylosis without myelopathy or radiculopathy, site unspecified: Secondary | ICD-10-CM | POA: Diagnosis not present

## 2022-08-25 DIAGNOSIS — N3946 Mixed incontinence: Secondary | ICD-10-CM | POA: Diagnosis not present

## 2022-08-25 DIAGNOSIS — J449 Chronic obstructive pulmonary disease, unspecified: Secondary | ICD-10-CM | POA: Diagnosis not present

## 2022-08-25 DIAGNOSIS — N8110 Cystocele, unspecified: Secondary | ICD-10-CM | POA: Diagnosis not present

## 2022-09-21 ENCOUNTER — Telehealth: Payer: Self-pay | Admitting: Family Medicine

## 2022-09-21 NOTE — Telephone Encounter (Signed)
Patient called and would like a med refill on the ACCU check monitor and lancets. [facility] [address].

## 2022-09-22 ENCOUNTER — Other Ambulatory Visit: Payer: Self-pay

## 2022-09-22 MED ORDER — ACCU-CHEK SOFTCLIX LANCETS MISC: 1 refills | Status: AC

## 2022-09-22 MED ORDER — ACCU-CHEK GUIDE W/DEVICE KIT: PACK | 0 refills | Status: DC

## 2022-09-22 NOTE — Telephone Encounter (Signed)
Rxs have been sent in. 

## 2022-09-25 DIAGNOSIS — M47819 Spondylosis without myelopathy or radiculopathy, site unspecified: Secondary | ICD-10-CM | POA: Diagnosis not present

## 2022-09-25 DIAGNOSIS — N3946 Mixed incontinence: Secondary | ICD-10-CM | POA: Diagnosis not present

## 2022-09-25 DIAGNOSIS — J449 Chronic obstructive pulmonary disease, unspecified: Secondary | ICD-10-CM | POA: Diagnosis not present

## 2022-09-25 DIAGNOSIS — N8110 Cystocele, unspecified: Secondary | ICD-10-CM | POA: Diagnosis not present

## 2022-10-19 ENCOUNTER — Other Ambulatory Visit: Payer: Self-pay | Admitting: Family Medicine

## 2022-10-19 DIAGNOSIS — I10 Essential (primary) hypertension: Secondary | ICD-10-CM

## 2022-10-28 ENCOUNTER — Other Ambulatory Visit: Payer: Self-pay | Admitting: Family Medicine

## 2022-10-30 ENCOUNTER — Other Ambulatory Visit: Payer: Self-pay | Admitting: Family Medicine

## 2022-10-30 DIAGNOSIS — L578 Other skin changes due to chronic exposure to nonionizing radiation: Secondary | ICD-10-CM

## 2022-10-30 NOTE — Telephone Encounter (Signed)
Past due for appointment. Notification sent in March, pt said she would make appointment never did.

## 2022-11-02 DIAGNOSIS — N3946 Mixed incontinence: Secondary | ICD-10-CM | POA: Diagnosis not present

## 2022-11-02 DIAGNOSIS — N8110 Cystocele, unspecified: Secondary | ICD-10-CM | POA: Diagnosis not present

## 2022-11-02 DIAGNOSIS — J449 Chronic obstructive pulmonary disease, unspecified: Secondary | ICD-10-CM | POA: Diagnosis not present

## 2022-11-02 DIAGNOSIS — M47819 Spondylosis without myelopathy or radiculopathy, site unspecified: Secondary | ICD-10-CM | POA: Diagnosis not present

## 2022-11-02 NOTE — Progress Notes (Unsigned)
[facility] at [facility] [address] [place], [place] [address] [phone]  Date:  11/06/2022   Name:  Robyn Montes   DOB:  December 08, 1955   MRN:  [id number]  PCP:  Pearline Cables, MD    Chief Complaint: No chief complaint on file.   History of Present Illness:  Robyn Montes is a 67 y.o. very pleasant female patient who presents with the following:  Virtual visit today to discuss medication refill Patient location is home, location is office.  Patient identity confirmed with 2 factors, she gives consent for virtual visit today.  The patient myself are present on the call today Most recent visit with myself was in September last year for her physical History of prediabetes, hypertension, hypothyroidism, hepatitis C status post curative treatment, memory loss   At our visit last year she was taking Mounjaro 2.5, I increased her dosage to 5 mg at that time-we then increased again to 7.5 mg in March  She is due for several services including tetanus, COVID booster, pneumonia vaccine, Shingrix, bone density scan Patient Active Problem List   Diagnosis Date Noted   Acne vulgaris 12/17/2020   Prediabetes 05/26/2019   Hepatitis B immune 04/04/2016   Hypothyroidism 10/01/2014   Memory loss 08/21/2014   Chronic hepatitis C (HCC) 08/04/2014   History of other specified conditions presenting hazards to health 08/04/2014   Palpitations 10/03/2013   Essential hypertension 10/03/2013   Biliary calculi 01/17/2013    Past Medical History:  Diagnosis Date   Anemia 1976, 1984, 2001, 2003   history of multiple transfusions (3); iron deficiency   Arthritis    hands, knees B; DDD lumbar   Blood transfusion without reported diagnosis    Family history of adverse reaction to anesthesia    pt's mother and daughter both had significant heart rate drops with anesthesia, believes her mother may have had a mini stroke while under  anesthesia   Family history of heart disease    Heart murmur    Hepatitis C 04/17/2013   acquired from blood transfusion; s/p [facility] in [place].   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   Palpitations    Pre-diabetes    on metformin for prediabetes   Shortness of breath    Sleep apnea    tested over 20 year ago, does not use cpap per patient    Past Surgical History:  Procedure Laterality Date   LIVER BIOPSY      Social History   Tobacco Use   Smoking status: Never   Smokeless tobacco: Never  Vaping Use   Vaping status: Never Used  Substance Use Topics   Alcohol use: No   Drug use: No    Family History  Problem Relation Age of Onset   Heart disease Mother        heart attack and stroke; passed away in her 16s   Diabetes Mother    Hyperlipidemia Mother    Hypertension Mother    Stroke Mother    Heart disease Father        passed away in his 39s   Hyperlipidemia Father    Diabetes Brother    Heart disease Brother    Hypertension Brother    Diabetes Brother    Hypertension Sister    Breast cancer Maternal Aunt     Allergies  Allergen Reactions   Bee  Venom Other (See Comments)    Unknown    Medication list has been reviewed and updated.  Current Outpatient Medications on File Prior to Visit  Medication Sig Dispense Refill   ACCU-CHEK GUIDE test strip USE TO CHECK BLOOD SUGAR 3 TIMES A DAY. DX CODE: E11.9 300 strip 1   Accu-Chek Softclix Lancets lancets USE TO CHECK BLOOD SUGAR 3 TIMES A DAY 300 each 1   Blood Glucose Monitoring Suppl (ACCU-CHEK GUIDE) w/Device KIT USE TO CHECK BLOOD SUGAR 3 TIMES A DAY 1 kit 0   Brimonidine Tartrate 0.025 % SOLN Place 1-2 drops into both eyes daily as needed (for dry eyes).     donepezil (ARICEPT) 5 MG tablet TAKE 1 TABLET BY MOUTH EVERYDAY AT BEDTIME 90 tablet 3   EPINEPHrine 0.3 mg/0.3 mL IJ SOAJ injection Inject 0.3 mg into the skin once as needed for up to 1 dose for  anaphylaxis. 2 each 3   hydroquinone 4 % cream APPLY TO AFFECTED AREA TOPICALLY DAILY FOR 2-3 WEEKS ONLY 28.35 g 1   Insulin Pen Needle (COMFORT EZ PEN NEEDLES) 32G X 5 MM MISC Use daily for Saxenda injection 100 each 3   losartan-hydrochlorothiazide (HYZAAR) 50-12.5 MG tablet Take 1 tablet by mouth 2 (two) times daily. 180 tablet 0   lovastatin (MEVACOR) 20 MG tablet TAKE 1 TABLET BY MOUTH EVERYDAY AT BEDTIME 90 tablet 1   metFORMIN (GLUCOPHAGE) 500 MG tablet TAKE 1 TABLET BY MOUTH TWICE A DAY WITH MEALS 180 tablet 1   metoprolol succinate (TOPROL-XL) 50 MG 24 hr tablet Take 1 tablet (50 mg total) by mouth daily. TAKE WITH OR IMMEDIATELY FOLLOWING A MEAL. 90 tablet 3   Naphazoline HCl (CLEAR EYES OP) Place 2 drops into both eyes daily as needed (for dry eyes).     potassium chloride (K-DUR) 10 MEQ tablet Take 1 tablet (10 mEq total) by mouth daily. 30 tablet 1   SYNTHROID 100 MCG tablet TAKE 1 TABLET BY MOUTH EVERY DAY BEFORE BREAKFAST 90 tablet 1   tirzepatide (MOUNJARO) 7.5 MG/0.5ML Pen Inject 7.5 mg into the skin once a week. 6 mL 1   tretinoin (RETIN-A) 0.05 % cream APPLY TOPICALLY AT BEDTIME AS NEEDED. 45 g 2   No current facility-administered medications on file prior to visit.    Review of Systems:  As per HPI- otherwise negative.   Physical Examination: There were no vitals filed for this visit. There were no vitals filed for this visit. There is no height or weight on file to calculate BMI. Ideal Body Weight:    ***  Assessment and Plan: ***  Signed Abbe Amsterdam, MD

## 2022-11-06 ENCOUNTER — Encounter: Payer: Self-pay | Admitting: Family Medicine

## 2022-11-06 ENCOUNTER — Telehealth: Payer: Medicare HMO | Admitting: Family Medicine

## 2022-11-06 DIAGNOSIS — E785 Hyperlipidemia, unspecified: Secondary | ICD-10-CM | POA: Diagnosis not present

## 2022-11-06 DIAGNOSIS — D171 Benign lipomatous neoplasm of skin and subcutaneous tissue of trunk: Secondary | ICD-10-CM | POA: Diagnosis not present

## 2022-11-06 DIAGNOSIS — Z1211 Encounter for screening for malignant neoplasm of colon: Secondary | ICD-10-CM | POA: Diagnosis not present

## 2022-11-06 DIAGNOSIS — K5900 Constipation, unspecified: Secondary | ICD-10-CM | POA: Diagnosis not present

## 2022-11-06 DIAGNOSIS — R7303 Prediabetes: Secondary | ICD-10-CM

## 2022-11-06 DIAGNOSIS — I10 Essential (primary) hypertension: Secondary | ICD-10-CM | POA: Diagnosis not present

## 2022-11-06 DIAGNOSIS — K8050 Calculus of bile duct without cholangitis or cholecystitis without obstruction: Secondary | ICD-10-CM | POA: Diagnosis not present

## 2022-11-06 MED ORDER — METOPROLOL SUCCINATE ER 50 MG PO TB24: 50.0000 mg | ORAL_TABLET | Freq: Every day | ORAL | 3 refills | Status: DC

## 2022-11-06 MED ORDER — LOVASTATIN 20 MG PO TABS: 20.0000 mg | ORAL_TABLET | Freq: Every day | ORAL | 3 refills | Status: DC

## 2022-11-06 MED ORDER — LOSARTAN POTASSIUM-HCTZ 50-12.5 MG PO TABS: 1.0000 | ORAL_TABLET | Freq: Two times a day (BID) | ORAL | 3 refills | Status: DC

## 2022-11-06 MED ORDER — METFORMIN HCL 500 MG PO TABS: 500.0000 mg | ORAL_TABLET | Freq: Two times a day (BID) | ORAL | 1 refills | Status: DC

## 2022-11-06 MED ORDER — TIRZEPATIDE 7.5 MG/0.5ML ~~LOC~~ SOAJ: 7.5000 mg | SUBCUTANEOUS | 1 refills | Status: DC

## 2022-11-30 ENCOUNTER — Encounter (INDEPENDENT_AMBULATORY_CARE_PROVIDER_SITE_OTHER): Payer: Self-pay

## 2022-12-01 ENCOUNTER — Other Ambulatory Visit: Payer: Self-pay | Admitting: Family Medicine

## 2022-12-01 DIAGNOSIS — R7303 Prediabetes: Secondary | ICD-10-CM

## 2022-12-20 ENCOUNTER — Other Ambulatory Visit: Payer: Self-pay | Admitting: Family Medicine

## 2022-12-20 DIAGNOSIS — R7303 Prediabetes: Secondary | ICD-10-CM

## 2022-12-20 MED ORDER — TIRZEPATIDE 7.5 MG/0.5ML ~~LOC~~ SOAJ: 7.5000 mg | SUBCUTANEOUS | 0 refills | Status: DC

## 2023-01-08 ENCOUNTER — Encounter: Payer: Medicare HMO | Admitting: Family Medicine

## 2023-01-25 NOTE — Patient Instructions (Signed)
It was good to see again today, I will be in touch with your lab work as soon as possible

## 2023-01-25 NOTE — Progress Notes (Deleted)
[facility] at [facility] [address], [address] [place], [place] [address] [phone]  Date:  01/29/2023   Name:  Robyn Montes   DOB:  [birth year]   MRN:  [id number]  PCP:  Pearline Cables, MD    Chief Complaint: No chief complaint on file.   History of Present Illness:  Robyn Montes is a 67 y.o. very pleasant female patient who presents with the following:  Patient seen today for physical exam Most recent visit with myself was a virtual visit in July History of prediabetes, hypertension, hypothyroidism, hepatitis C status post curative treatment, memory loss   She needs to get a pelvic ultrasound to follow-up on findings from CT done by [facility] over the summer- ?  Was this completed  Tetanus COVID booster Shingrix Pneumonia Flu shot Bone density Cologuard up-to-date Mammogram can be updated Pap completed last year, normal Patient Active Problem List   Diagnosis Date Noted   Acne vulgaris 12/17/2020   Prediabetes 05/26/2019   Hepatitis B immune 04/04/2016   Hypothyroidism 10/01/2014   Memory loss 08/21/2014   Chronic hepatitis C (HCC) 08/04/2014   History of other specified conditions presenting hazards to health 08/04/2014   Palpitations 10/03/2013   Essential hypertension 10/03/2013   Biliary calculi 01/17/2013    Past Medical History:  Diagnosis Date   Anemia 1976, 1984, 2001, 2003   history of multiple transfusions (3); iron deficiency   Arthritis    hands, knees B; DDD lumbar   Blood transfusion without reported diagnosis    Family history of adverse reaction to anesthesia    pt's mother and daughter both had significant heart rate drops with anesthesia, believes her mother may have had a mini stroke while under anesthesia   Family history of heart disease    Heart murmur    Hepatitis C 04/17/2013   acquired from blood transfusion; s/p [facility] treatment [facility] in [place].   HTN (hypertension)     Hypothyroid    Memory loss 08/21/2014   Palpitations    Pre-diabetes    on metformin for prediabetes   Shortness of breath    Sleep apnea    tested over 20 year ago, does not use cpap per patient    Past Surgical History:  Procedure Laterality Date   LIVER BIOPSY      Social History   Tobacco Use   Smoking status: Never   Smokeless tobacco: Never  Vaping Use   Vaping status: Never Used  Substance Use Topics   Alcohol use: No   Drug use: No    Family History  Problem Relation Age of Onset   Heart disease Mother        heart attack and stroke; passed away in her 50s   Diabetes Mother    Hyperlipidemia Mother    Hypertension Mother    Stroke Mother    Heart disease Father        passed away in his 30s   Hyperlipidemia Father    Diabetes Brother    Heart disease Brother    Hypertension Brother    Diabetes Brother    Hypertension Sister    Breast cancer Maternal Aunt     Allergies  Allergen Reactions   Bee Venom Other (See Comments)    Unknown    Medication list has been reviewed and updated.  Current Outpatient Medications on File Prior to Visit  Medication Sig Dispense Refill  Accu-Chek Softclix Lancets lancets USE TO CHECK BLOOD SUGAR 3 TIMES A DAY 300 each 1   Blood Glucose Monitoring Suppl (ACCU-CHEK GUIDE) w/Device KIT USE TO CHECK BLOOD SUGAR 3 TIMES A DAY 1 kit 0   Brimonidine Tartrate 0.025 % SOLN Place 1-2 drops into both eyes daily as needed (for dry eyes).     donepezil (ARICEPT) 5 MG tablet TAKE 1 TABLET BY MOUTH EVERYDAY AT BEDTIME 90 tablet 3   EPINEPHrine 0.3 mg/0.3 mL IJ SOAJ injection Inject 0.3 mg into the skin once as needed for up to 1 dose for anaphylaxis. 2 each 3   glucose blood (ACCU-CHEK GUIDE) test strip USE TO CHECK BLOOD SUGAR 3 TIMES A DAY. DX CODE: E11.9 300 strip 12   hydroquinone 4 % cream APPLY TO AFFECTED AREA TOPICALLY DAILY FOR 2-3 WEEKS ONLY 28.35 g 1   Insulin Pen Needle (COMFORT EZ PEN NEEDLES) 32G X 5 MM MISC Use daily  for Saxenda injection 100 each 3   losartan-hydrochlorothiazide (HYZAAR) 50-12.5 MG tablet Take 1 tablet by mouth 2 (two) times daily. 180 tablet 3   lovastatin (MEVACOR) 20 MG tablet Take 1 tablet (20 mg total) by mouth daily. 90 tablet 3   metFORMIN (GLUCOPHAGE) 500 MG tablet TAKE 1 TABLET BY MOUTH TWICE A DAY WITH FOOD 180 tablet 1   metoprolol succinate (TOPROL-XL) 50 MG 24 hr tablet Take 1 tablet (50 mg total) by mouth daily. TAKE WITH OR IMMEDIATELY FOLLOWING A MEAL. 90 tablet 3   Naphazoline HCl (CLEAR EYES OP) Place 2 drops into both eyes daily as needed (for dry eyes).     potassium chloride (K-DUR) 10 MEQ tablet Take 1 tablet (10 mEq total) by mouth daily. 30 tablet 1   SYNTHROID 100 MCG tablet TAKE 1 TABLET BY MOUTH EVERY DAY BEFORE BREAKFAST 90 tablet 1   tirzepatide (MOUNJARO) 7.5 MG/0.5ML Pen Inject 7.5 mg into the skin once a week. 2 mL 0   tretinoin (RETIN-A) 0.05 % cream APPLY TOPICALLY AT BEDTIME AS NEEDED. 45 g 2   No current facility-administered medications on file prior to visit.    Review of Systems:  As per HPI- otherwise negative.   Physical Examination: There were no vitals filed for this visit. There were no vitals filed for this visit. There is no height or weight on file to calculate BMI. Ideal Body Weight:    GEN: no acute distress. HEENT: Atraumatic, Normocephalic.  Ears and Nose: No external deformity. CV: RRR, No M/G/R. No JVD. No thrill. No extra heart sounds. PULM: CTA B, no wheezes, crackles, rhonchi. No retractions. No resp. distress. No accessory muscle use. ABD: S, NT, ND, +BS. No rebound. No HSM. EXTR: No c/c/e PSYCH: Normally interactive. Conversant.    Assessment and Plan: *** Physical exam today.  Encouraged healthy diet and exercise routine Signed Abbe Amsterdam, MD

## 2023-01-29 ENCOUNTER — Ambulatory Visit (INDEPENDENT_AMBULATORY_CARE_PROVIDER_SITE_OTHER): Payer: Medicare HMO | Admitting: Family Medicine

## 2023-01-29 DIAGNOSIS — E559 Vitamin D deficiency, unspecified: Secondary | ICD-10-CM

## 2023-01-29 DIAGNOSIS — Z0000 Encounter for general adult medical examination without abnormal findings: Secondary | ICD-10-CM

## 2023-01-29 DIAGNOSIS — I10 Essential (primary) hypertension: Secondary | ICD-10-CM

## 2023-01-29 DIAGNOSIS — R7303 Prediabetes: Secondary | ICD-10-CM

## 2023-01-29 DIAGNOSIS — E039 Hypothyroidism, unspecified: Secondary | ICD-10-CM

## 2023-01-29 DIAGNOSIS — Z538 Procedure and treatment not carried out for other reasons: Secondary | ICD-10-CM

## 2023-01-29 DIAGNOSIS — Z130 Encounter for screening for diseases of the blood and blood-forming organs and certain disorders involving the immune mechanism: Secondary | ICD-10-CM

## 2023-01-29 DIAGNOSIS — E785 Hyperlipidemia, unspecified: Secondary | ICD-10-CM

## 2023-01-30 NOTE — Progress Notes (Signed)
Appt canceled!

## 2023-02-01 ENCOUNTER — Ambulatory Visit: Payer: Medicare HMO | Admitting: Family Medicine

## 2023-02-01 ENCOUNTER — Ambulatory Visit (HOSPITAL_BASED_OUTPATIENT_CLINIC_OR_DEPARTMENT_OTHER)
Admission: RE | Admit: 2023-02-01 | Discharge: 2023-02-01 | Disposition: A | Discharge status: Home / Self Care | Payer: Medicare HMO | Source: Ambulatory Visit | Attending: Family Medicine | Admitting: Family Medicine

## 2023-02-01 ENCOUNTER — Other Ambulatory Visit: Payer: Self-pay | Admitting: Family Medicine

## 2023-02-01 ENCOUNTER — Encounter: Payer: Self-pay | Admitting: Family Medicine

## 2023-02-01 VITALS — BP 136/72 | HR 78 | Temp 97.8°F | Resp 18 | Ht 64.5 in | Wt 194.0 lb

## 2023-02-01 DIAGNOSIS — R0781 Pleurodynia: Secondary | ICD-10-CM

## 2023-02-01 DIAGNOSIS — N6452 Nipple discharge: Secondary | ICD-10-CM | POA: Diagnosis not present

## 2023-02-01 DIAGNOSIS — R5383 Other fatigue: Secondary | ICD-10-CM

## 2023-02-01 DIAGNOSIS — E785 Hyperlipidemia, unspecified: Secondary | ICD-10-CM | POA: Diagnosis not present

## 2023-02-01 DIAGNOSIS — E039 Hypothyroidism, unspecified: Secondary | ICD-10-CM

## 2023-02-01 DIAGNOSIS — I10 Essential (primary) hypertension: Secondary | ICD-10-CM

## 2023-02-01 DIAGNOSIS — L578 Other skin changes due to chronic exposure to nonionizing radiation: Secondary | ICD-10-CM

## 2023-02-01 DIAGNOSIS — Z0000 Encounter for general adult medical examination without abnormal findings: Secondary | ICD-10-CM | POA: Diagnosis not present

## 2023-02-01 DIAGNOSIS — N644 Mastodynia: Secondary | ICD-10-CM

## 2023-02-01 DIAGNOSIS — Z23 Encounter for immunization: Secondary | ICD-10-CM

## 2023-02-01 DIAGNOSIS — D171 Benign lipomatous neoplasm of skin and subcutaneous tissue of trunk: Secondary | ICD-10-CM | POA: Diagnosis not present

## 2023-02-01 DIAGNOSIS — R82998 Other abnormal findings in urine: Secondary | ICD-10-CM

## 2023-02-01 DIAGNOSIS — R7303 Prediabetes: Secondary | ICD-10-CM

## 2023-02-01 DIAGNOSIS — E2839 Other primary ovarian failure: Secondary | ICD-10-CM

## 2023-02-01 LAB — COMPREHENSIVE METABOLIC PANEL
ALT: 10 U/L (ref 0–35)
AST: 17 U/L (ref 0–37)
Albumin: 4.4 g/dL (ref 3.5–5.2)
Alkaline Phosphatase: 69 U/L (ref 39–117)
BUN: 8 mg/dL (ref 6–23)
CO2: 30 meq/L (ref 19–32)
Calcium: 9.7 mg/dL (ref 8.4–10.5)
Chloride: 101 meq/L (ref 96–112)
Creatinine, Ser: 0.79 mg/dL (ref 0.40–1.20)
GFR: 78.26 mL/min (ref >60.00)
Glucose, Bld: 79 mg/dL (ref 70–99)
Potassium: 4 meq/L (ref 3.5–5.1)
Sodium: 140 meq/L (ref 135–145)
Total Bilirubin: 0.4 mg/dL (ref 0.2–1.2)
Total Protein: 7.6 g/dL (ref 6.0–8.3)

## 2023-02-01 LAB — LIPID PANEL
Cholesterol: 177 mg/dL (ref 0–200)
HDL: 46.8 mg/dL (ref >39.00)
LDL Cholesterol: 115 mg/dL — ABNORMAL HIGH (ref 0–99)
NonHDL: 130.44
Total CHOL/HDL Ratio: 4
Triglycerides: 75 mg/dL (ref 0.0–149.0)
VLDL: 15 mg/dL (ref 0.0–40.0)

## 2023-02-01 LAB — POCT URINALYSIS DIP (MANUAL ENTRY)
Bilirubin, UA: NEGATIVE
Blood, UA: NEGATIVE
Glucose, UA: NEGATIVE mg/dL
Ketones, POC UA: NEGATIVE mg/dL
Leukocytes, UA: NEGATIVE
Nitrite, UA: NEGATIVE
Protein Ur, POC: NEGATIVE mg/dL
Spec Grav, UA: <=1.005 — AB (ref 1.010–1.025)
Urobilinogen, UA: 0.2 U/dL
pH, UA: 5 (ref 5.0–8.0)

## 2023-02-01 LAB — CBC
HCT: 40.8 % (ref 36.0–46.0)
Hemoglobin: 12.9 g/dL (ref 12.0–15.0)
MCHC: 31.7 g/dL (ref 30.0–36.0)
MCV: 81.8 fl (ref 78.0–100.0)
Platelets: 247 K/uL (ref 150.0–400.0)
RBC: 4.98 Mil/uL (ref 3.87–5.11)
RDW: 16.1 % — ABNORMAL HIGH (ref 11.5–15.5)
WBC: 8.7 K/uL (ref 4.0–10.5)

## 2023-02-01 LAB — HEMOGLOBIN A1C: Hgb A1c MFr Bld: 5.5 % (ref 4.6–6.5)

## 2023-02-01 LAB — TSH: TSH: 0.2 u[IU]/mL — ABNORMAL LOW (ref 0.35–5.50)

## 2023-02-01 LAB — VITAMIN D 25 HYDROXY (VIT D DEFICIENCY, FRACTURES): VITD: 46.5 ng/mL (ref 30.00–100.00)

## 2023-02-01 MED ORDER — LEVOTHYROXINE SODIUM 75 MCG PO TABS
75.0000 ug | ORAL_TABLET | Freq: Every day | ORAL | 3 refills | Status: DC
Start: 2023-02-01 — End: 2024-01-16

## 2023-02-01 NOTE — Patient Instructions (Addendum)
Great to see you today I will set you up with general surgery to discuss potentially removing that lipoma Will arrange for a special mammogram and right breast ultrasound due to the pain you have noticed  I also ordered a chest x-ray and bone density scan that can be done at the [facility] imaging dept at your convenience Recommend shingles vaccine series and pneumonia booster at your convenience

## 2023-02-01 NOTE — Progress Notes (Addendum)
[facility] [address] [place], [place] [address] [phone]  Date:  02/01/2023   Name:  Robyn Montes   DOB:  1955-05-23   MRN:  [id number]  PCP:  Pearline Cables, MD    Chief Complaint: Annual Exam (Concerns/ questions: pain under the right breast, some nipple discharge. 2. Slow stream and dark color in the urine, with frequency. /Flu shot today: yes/Pna: due)   History of Present Illness:  Robyn Montes is a 67 y.o. very pleasant female patient who presents with the following:  Seen today for CPE  History of prediabetes, hypertension, hypothyroidism, hepatitis C status post curative treatment, memory loss Most recent visit with myself was a virtual visit in July; she had been taking Mounjaro but had a gap due to life circumstance.  We restarted Mounjaro in July I also had her start back on Aricept at that time  She notes bilateral nipple discharge- had this in the past, resolved but came back She notes "sharp pain in the right ribs and side" present for about 4 months Seems to be getting worse gradually  She has noted dark urine and more frequent urination - she feels like she has to strain to pass her urine- has noted this for a few months   Mammo one year ago - normal  Negative pap last year  Cologaurd is UTD  Pt wishes to defer pneumonia vaccine but do flu shot today Order bone density scan   She is up to 7.5 mg of Mounjaro now- she is doing well, no SE noted  She is down about 20 lbs  Wt Readings from Last 3 Encounters:  02/01/23 194 lb (88 kg)  05/25/22 207 lb (93.9 kg)  01/11/22 208 lb 12.8 oz (94.7 kg)   The large lipoma on her right upper back seems to be getting bigger- it is starting to be uncomfortable when she is supine.  Would like to look at having this removed    Patient Active Problem List   Diagnosis Date Noted   Acne vulgaris 12/17/2020   Prediabetes 05/26/2019    Hepatitis B immune 04/04/2016   Hypothyroidism 10/01/2014   Memory loss 08/21/2014   Chronic hepatitis C (HCC) 08/04/2014   History of other specified conditions presenting hazards to health 08/04/2014   Palpitations 10/03/2013   Essential hypertension 10/03/2013   Biliary calculi 01/17/2013    Past Medical History:  Diagnosis Date   Anemia 1976, 1984, 2001, 2003   history of multiple transfusions (3); iron deficiency   Arthritis    hands, knees B; DDD lumbar   Blood transfusion without reported diagnosis    Family history of adverse reaction to anesthesia    pt's mother and daughter both had significant heart rate drops with anesthesia, believes her mother may have had a mini stroke while under anesthesia   Family history of heart disease    Heart murmur    Hepatitis C 04/17/2013   acquired from blood transfusion; s/p [facility] in [place].   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   Palpitations    Pre-diabetes    on metformin for prediabetes   Shortness of breath    Sleep apnea    tested over 20 year ago, does not use cpap per patient    Past Surgical History:  Procedure Laterality Date   LIVER BIOPSY  Social History   Tobacco Use   Smoking status: Never   Smokeless tobacco: Never  Vaping Use   Vaping status: Never Used  Substance Use Topics   Alcohol use: No   Drug use: No    Family History  Problem Relation Age of Onset   Heart disease Mother        heart attack and stroke; passed away in her 21s   Diabetes Mother    Hyperlipidemia Mother    Hypertension Mother    Stroke Mother    Heart disease Father        passed away in his 51s   Hyperlipidemia Father    Diabetes Brother    Heart disease Brother    Hypertension Brother    Diabetes Brother    Hypertension Sister    Breast cancer Maternal Aunt     Allergies  Allergen Reactions   Bee Venom Other (See Comments)    Unknown    Medication list has  been reviewed and updated.  Current Outpatient Medications on File Prior to Visit  Medication Sig Dispense Refill   Accu-Chek Softclix Lancets lancets USE TO CHECK BLOOD SUGAR 3 TIMES A DAY 300 each 1   Blood Glucose Monitoring Suppl (ACCU-CHEK GUIDE) w/Device KIT USE TO CHECK BLOOD SUGAR 3 TIMES A DAY 1 kit 0   Brimonidine Tartrate 0.025 % SOLN Place 1-2 drops into both eyes daily as needed (for dry eyes).     donepezil (ARICEPT) 5 MG tablet TAKE 1 TABLET BY MOUTH EVERYDAY AT BEDTIME 90 tablet 3   EPINEPHrine 0.3 mg/0.3 mL IJ SOAJ injection Inject 0.3 mg into the skin once as needed for up to 1 dose for anaphylaxis. 2 each 3   glucose blood (ACCU-CHEK GUIDE) test strip USE TO CHECK BLOOD SUGAR 3 TIMES A DAY. DX CODE: E11.9 300 strip 12   hydroquinone 4 % cream APPLY TO AFFECTED AREA TOPICALLY DAILY FOR 2-3 WEEKS ONLY 28.35 g 1   Insulin Pen Needle (COMFORT EZ PEN NEEDLES) 32G X 5 MM MISC Use daily for Saxenda injection 100 each 3   losartan-hydrochlorothiazide (HYZAAR) 50-12.5 MG tablet Take 1 tablet by mouth 2 (two) times daily. 180 tablet 3   lovastatin (MEVACOR) 20 MG tablet Take 1 tablet (20 mg total) by mouth daily. 90 tablet 3   metFORMIN (GLUCOPHAGE) 500 MG tablet TAKE 1 TABLET BY MOUTH TWICE A DAY WITH FOOD 180 tablet 1   metoprolol succinate (TOPROL-XL) 50 MG 24 hr tablet Take 1 tablet (50 mg total) by mouth daily. TAKE WITH OR IMMEDIATELY FOLLOWING A MEAL. 90 tablet 3   Naphazoline HCl (CLEAR EYES OP) Place 2 drops into both eyes daily as needed (for dry eyes).     potassium chloride (K-DUR) 10 MEQ tablet Take 1 tablet (10 mEq total) by mouth daily. 30 tablet 1   SYNTHROID 100 MCG tablet TAKE 1 TABLET BY MOUTH EVERY DAY BEFORE BREAKFAST 90 tablet 1   tirzepatide (MOUNJARO) 7.5 MG/0.5ML Pen Inject 7.5 mg into the skin once a week. 2 mL 0   No current facility-administered medications on file prior to visit.    Review of Systems:  As per HPI- otherwise negative.   Physical  Examination: Vitals:   02/01/23 1047  BP: 136/72  Pulse: 78  Resp: 18  Temp: 97.8 F (36.6 C)  SpO2: 98%   Vitals:   02/01/23 1047  Weight: 194 lb (88 kg)  Height: 5' 4.5" (1.638 m)   Body mass index  is 32.79 kg/m. Ideal Body Weight: Weight in (lb) to have BMI = 25: 147.6  GEN: no acute distress. Mild obesity, looks well  HEENT: Atraumatic, Normocephalic.  Bilateral TM wnl, oropharynx normal.  PEERL,EOMI.   Ears and Nose: No external deformity. CV: RRR, No M/G/R. No JVD. No thrill. No extra heart sounds. PULM: CTA B, no wheezes, crackles, rhonchi. No retractions. No resp. distress. No accessory muscle use. ABD: S, NT, ND, +BS. No rebound. No HSM. EXTR: No c/c/e PSYCH: Normally interactive. Conversant.  Grapefruit sized lipoma right posterior back at bra-line  Breast exam normal bilaterally- I cannot elicit any nipple discharge Assessment and Plan: Physical exam  Immunization due - Plan: Flu Vaccine Trivalent High Dose (Fluad)  Dark urine - Plan: Urine Culture, POCT urinalysis dipstick  Essential hypertension, benign - Plan: CBC, Comprehensive metabolic panel  Dyslipidemia - Plan: Lipid panel  Borderline diabetes - Plan: Hemoglobin A1c  Hypothyroidism, unspecified type - Plan: TSH  Fatigue, unspecified type - Plan: VITAMIN D 25 Hydroxy (Vit-D Deficiency, Fractures)  Lipoma of torso - Plan: Ambulatory referral to General Surgery  Nipple discharge - Plan: Prolactin  Breast pain - Plan: MM Digital Diagnostic Bilat, US BREAST COMPLETE UNI RIGHT INC AXILLA  Rib pain on right side - Plan: DG Chest 2 View  Estrogen deficiency - Plan: DG Bone Density  Physical exam today, encouraged healthy diet and exercise routine Flu shot given Lab work pending as above Patient has noted possible pain in her right breast and nipple discharge.  Ordered prolactin level, diagnostic mammogram, right breast ultrasound.  Will also order a chest x-ray due to rib pain on right side.   Referral to general surgery to discuss possible removal of large lipoma  Will plan further follow- up pending labs.  Signed Abbe Amsterdam, MD  Received imaging and lab results as below-message to patient  Results for orders placed or performed in visit on 02/01/23  CBC  Result Value Ref Range   WBC 8.7 4.0 - 10.5 K/uL   RBC 4.98 3.87 - 5.11 Mil/uL   Platelets 247.0 150.0 - 400.0 K/uL   Hemoglobin 12.9 12.0 - 15.0 g/dL   HCT 16.1 09.6 - 04.5 %   MCV 81.8 78.0 - 100.0 fl   MCHC 31.7 30.0 - 36.0 g/dL   RDW 40.9 (H) 81.1 - 91.4 %  Comprehensive metabolic panel  Result Value Ref Range   Sodium 140 135 - 145 mEq/L   Potassium 4.0 3.5 - 5.1 mEq/L   Chloride 101 96 - 112 mEq/L   CO2 30 19 - 32 mEq/L   Glucose, Bld 79 70 - 99 mg/dL   BUN 8 6 - 23 mg/dL   Creatinine, Ser 7.82 0.40 - 1.20 mg/dL   Total Bilirubin 0.4 0.2 - 1.2 mg/dL   Alkaline Phosphatase 69 39 - 117 U/L   AST 17 0 - 37 U/L   ALT 10 0 - 35 U/L   Total Protein 7.6 6.0 - 8.3 g/dL   Albumin 4.4 3.5 - 5.2 g/dL   GFR 95.62 >13.08 mL/min   Calcium 9.7 8.4 - 10.5 mg/dL  Hemoglobin M5H  Result Value Ref Range   Hgb A1c MFr Bld 5.5 4.6 - 6.5 %  Lipid panel  Result Value Ref Range   Cholesterol 177 0 - 200 mg/dL   Triglycerides 84.6 0.0 - 149.0 mg/dL   HDL 96.29 >52.84 mg/dL   VLDL 13.2 0.0 - 44.0 mg/dL   LDL Cholesterol 102 (H) 0 - 99  mg/dL   Total CHOL/HDL Ratio 4    NonHDL 130.44   TSH  Result Value Ref Range   TSH 0.20 (L) 0.35 - 5.50 uIU/mL  VITAMIN D 25 Hydroxy (Vit-D Deficiency, Fractures)  Result Value Ref Range   VITD 46.50 30.00 - 100.00 ng/mL  POCT urinalysis dipstick  Result Value Ref Range   Color, UA yellow yellow   Clarity, UA cloudy (A) clear   Glucose, UA negative negative mg/dL   Bilirubin, UA negative negative   Ketones, POC UA negative negative mg/dL   Spec Grav, UA <=8.413 (A) 1.010 - 1.025   Blood, UA negative negative   pH, UA 5.0 5.0 - 8.0   Protein Ur, POC negative negative mg/dL    Urobilinogen, UA 0.2 0.2 or 1.0 E.U./dL   Nitrite, UA Negative Negative   Leukocytes, UA Negative Negative    [place]

## 2023-02-01 NOTE — Addendum Note (Signed)
Addended by: Abbe Amsterdam C on: 02/01/2023 08:47 PM   Modules accepted: Orders

## 2023-02-02 ENCOUNTER — Encounter: Payer: Self-pay | Admitting: Family Medicine

## 2023-02-02 LAB — URINE CULTURE
MICRO NUMBER:: 15608036
Result:: NO GROWTH
SPECIMEN QUALITY:: ADEQUATE

## 2023-02-02 LAB — PROLACTIN: Prolactin: 7.5 ng/mL

## 2023-02-03 ENCOUNTER — Encounter: Payer: Self-pay | Admitting: Family Medicine

## 2023-02-05 ENCOUNTER — Inpatient Hospital Stay (HOSPITAL_BASED_OUTPATIENT_CLINIC_OR_DEPARTMENT_OTHER): Admission: RE | Admit: 2023-02-05 | Payer: Medicare HMO | Source: Ambulatory Visit

## 2023-02-07 DIAGNOSIS — M47819 Spondylosis without myelopathy or radiculopathy, site unspecified: Secondary | ICD-10-CM | POA: Diagnosis not present

## 2023-02-07 DIAGNOSIS — J449 Chronic obstructive pulmonary disease, unspecified: Secondary | ICD-10-CM | POA: Diagnosis not present

## 2023-02-07 DIAGNOSIS — N3946 Mixed incontinence: Secondary | ICD-10-CM | POA: Diagnosis not present

## 2023-02-07 DIAGNOSIS — N8110 Cystocele, unspecified: Secondary | ICD-10-CM | POA: Diagnosis not present

## 2023-02-07 MED ORDER — TIRZEPATIDE 5 MG/0.5ML ~~LOC~~ SOAJ: 5.0000 mg | SUBCUTANEOUS | 2 refills | Status: DC

## 2023-02-20 ENCOUNTER — Other Ambulatory Visit: Payer: Medicare HMO

## 2023-03-21 ENCOUNTER — Encounter: Payer: Self-pay | Admitting: Family Medicine

## 2023-03-22 MED ORDER — TIRZEPATIDE 7.5 MG/0.5ML ~~LOC~~ SOAJ: 7.5000 mg | SUBCUTANEOUS | 1 refills | Status: DC

## 2023-03-22 NOTE — Telephone Encounter (Signed)
Okay to increase dose?  

## 2023-04-09 ENCOUNTER — Other Ambulatory Visit: Payer: Self-pay | Admitting: Family Medicine

## 2023-04-09 DIAGNOSIS — N3946 Mixed incontinence: Secondary | ICD-10-CM | POA: Diagnosis not present

## 2023-04-09 DIAGNOSIS — M47819 Spondylosis without myelopathy or radiculopathy, site unspecified: Secondary | ICD-10-CM | POA: Diagnosis not present

## 2023-04-09 DIAGNOSIS — N8110 Cystocele, unspecified: Secondary | ICD-10-CM | POA: Diagnosis not present

## 2023-04-09 DIAGNOSIS — E038 Other specified hypothyroidism: Secondary | ICD-10-CM

## 2023-04-09 DIAGNOSIS — J449 Chronic obstructive pulmonary disease, unspecified: Secondary | ICD-10-CM | POA: Diagnosis not present

## 2023-04-25 ENCOUNTER — Other Ambulatory Visit: Payer: Self-pay | Admitting: Family Medicine

## 2023-04-25 DIAGNOSIS — E038 Other specified hypothyroidism: Secondary | ICD-10-CM

## 2023-04-29 NOTE — Progress Notes (Deleted)
 [facility] [address] [place], [place] [address] [phone]  Date:  04/30/2023   Name:  Robyn Montes   DOB:  03/05/1956   MRN:  [id number]  PCP:  Watt Harlene BROCKS, MD    Chief Complaint: No chief complaint on file.   History of Present Illness:  Robyn Montes is a 68 y.o. very pleasant female patient who presents with the following:  Patient seen today for follow-up visit Most recently seen by myself in October for her physical History of prediabetes, hypertension, hypothyroidism, hepatitis C status post curative treatment, memory loss   In October we will gradually retitrating Mounjaro , she was taking 7.5 mg at that time  Also at our visit in October she had concern of right breast pain and nipple discharge.  I ordered a diagnostic mammogram and breast ultrasound-however I do not believe these were completed Prolactin level was normal  Flu vaccine is up-to-date Recommend Shingrix, COVID booster, tetanus  Due for pneumonia vaccine  Patient Active Problem List   Diagnosis Date Noted   Acne vulgaris 12/17/2020   Prediabetes 05/26/2019   Hepatitis B immune 04/04/2016   Hypothyroidism 10/01/2014   Memory loss 08/21/2014   Chronic hepatitis C (HCC) 08/04/2014   History of other specified conditions presenting hazards to health 08/04/2014   Palpitations 10/03/2013   Essential hypertension 10/03/2013   Biliary calculi 01/17/2013    Past Medical History:  Diagnosis Date   Anemia 1976, 1984, 2001, 2003   history of multiple transfusions (3); iron deficiency   Arthritis    hands, knees B; DDD lumbar   Blood transfusion without reported diagnosis    Family history of adverse reaction to anesthesia    pt's mother and daughter both had significant heart rate drops with anesthesia, believes her mother may have had a mini stroke while under anesthesia   Family history of heart disease    Heart  murmur    Hepatitis C 04/17/2013   acquired from blood transfusion; s/p [facility] treatment [facility] in [place].   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   Palpitations    Pre-diabetes    on metformin  for prediabetes   Shortness of breath    Sleep apnea    tested over 20 year ago, does not use cpap per patient    Past Surgical History:  Procedure Laterality Date   LIVER BIOPSY      Social History   Tobacco Use   Smoking status: Never   Smokeless tobacco: Never  Vaping Use   Vaping status: Never Used  Substance Use Topics   Alcohol use: No   Drug use: No    Family History  Problem Relation Age of Onset   Heart disease Mother        heart attack and stroke; passed away in her 84s   Diabetes Mother    Hyperlipidemia Mother    Hypertension Mother    Stroke Mother    Heart disease Father        passed away in his 39s   Hyperlipidemia Father    Diabetes Brother    Heart disease Brother    Hypertension Brother    Diabetes Brother    Hypertension Sister    Breast cancer Maternal Aunt     Allergies  Allergen Reactions   Bee Venom Other (See Comments)    Unknown    Medication  list has been reviewed and updated.  Current Outpatient Medications on File Prior to Visit  Medication Sig Dispense Refill   Accu-Chek Softclix Lancets lancets USE TO CHECK BLOOD SUGAR 3 TIMES A DAY 300 each 1   Blood Glucose Monitoring Suppl (ACCU-CHEK GUIDE) w/Device KIT USE TO CHECK BLOOD SUGAR 3 TIMES A DAY 1 kit 0   Brimonidine Tartrate 0.025 % SOLN Place 1-2 drops into both eyes daily as needed (for dry eyes).     donepezil  (ARICEPT ) 5 MG tablet TAKE 1 TABLET BY MOUTH EVERYDAY AT BEDTIME 90 tablet 3   EPINEPHrine  0.3 mg/0.3 mL IJ SOAJ injection Inject 0.3 mg into the skin once as needed for up to 1 dose for anaphylaxis. 2 each 3   glucose blood (ACCU-CHEK GUIDE) test strip USE TO CHECK BLOOD SUGAR 3 TIMES A DAY. DX CODE: E11.9 300 strip 12   hydroquinone  4 % cream  APPLY TO AFFECTED AREA TOPICALLY DAILY FOR 2-3 WEEKS ONLY 28.35 g 1   Insulin Pen Needle (COMFORT EZ PEN NEEDLES) 32G X 5 MM MISC Use daily for Saxenda  injection 100 each 3   levothyroxine  (SYNTHROID ) 75 MCG tablet Take 1 tablet (75 mcg total) by mouth daily. 90 tablet 3   losartan -hydrochlorothiazide  (HYZAAR ) 50-12.5 MG tablet Take 1 tablet by mouth 2 (two) times daily. 180 tablet 3   lovastatin  (MEVACOR ) 20 MG tablet Take 1 tablet (20 mg total) by mouth daily. 90 tablet 3   metFORMIN  (GLUCOPHAGE ) 500 MG tablet TAKE 1 TABLET BY MOUTH TWICE A DAY WITH FOOD 180 tablet 1   metoprolol  succinate (TOPROL -XL) 50 MG 24 hr tablet Take 1 tablet (50 mg total) by mouth daily. TAKE WITH OR IMMEDIATELY FOLLOWING A MEAL. 90 tablet 3   Naphazoline HCl (CLEAR EYES OP) Place 2 drops into both eyes daily as needed (for dry eyes).     potassium chloride  (K-DUR) 10 MEQ tablet Take 1 tablet (10 mEq total) by mouth daily. 30 tablet 1   tirzepatide  (MOUNJARO ) 7.5 MG/0.5ML Pen Inject 7.5 mg into the skin once a week. 6 mL 1   tretinoin  (RETIN-A ) 0.05 % cream APPLY TOPICALLY AT BEDTIME AS NEEDED. 45 g 2   No current facility-administered medications on file prior to visit.    Review of Systems:  As per HPI- otherwise negative.   Physical Examination: There were no vitals filed for this visit. There were no vitals filed for this visit. There is no height or weight on file to calculate BMI. Ideal Body Weight:    GEN: no acute distress. HEENT: Atraumatic, Normocephalic.  Ears and Nose: No external deformity. CV: RRR, No M/G/R. No JVD. No thrill. No extra heart sounds. PULM: CTA B, no wheezes, crackles, rhonchi. No retractions. No resp. distress. No accessory muscle use. ABD: S, NT, ND, +BS. No rebound. No HSM. EXTR: No c/c/e PSYCH: Normally interactive. Conversant.    Assessment and Plan: ***  Signed Harlene Schroeder, MD

## 2023-04-30 ENCOUNTER — Ambulatory Visit: Payer: Medicare HMO | Admitting: Family Medicine

## 2023-06-03 ENCOUNTER — Other Ambulatory Visit: Payer: Self-pay | Admitting: Family Medicine

## 2023-06-03 DIAGNOSIS — L578 Other skin changes due to chronic exposure to nonionizing radiation: Secondary | ICD-10-CM

## 2023-08-20 ENCOUNTER — Ambulatory Visit: Admitting: Family Medicine

## 2023-08-24 NOTE — Progress Notes (Unsigned)
 [facility] [address], [address] [place], [place] [address] [phone]  Date:  08/27/2023   Name:  Robyn Montes   DOB:  1955-05-08   MRN:  [id number]  PCP:  Kaylee Partridge, MD    Chief Complaint: No chief complaint on file.   History of Present Illness:  Robyn Montes is a 68 y.o. very pleasant female patient who presents with the following:  Patient seen today for a follow-up visit Most recently seen by myself in October History of prediabetes, hypertension, hypothyroidism, hepatitis C status post curative treatment, memory loss   At that time we had recently started her back on Mounjaro  as well as Aricept  She complained of nipple discharge and I ordered a diagnostic mammogram as well as an ultrasound but looks like neither of these have been completed Her prolactin level was normal  Tetanus booster Recommend COVID booster Recommend pneumonia vaccination Recommend Shingrix Mammogram, DEXA scan are both ordered Lab work was completed in [place] TSH was suppressed at that time.  Recheck today  Lab Results  Component Value Date   HGBA1C 5.5 02/01/2023    Patient Active Problem List   Diagnosis Date Noted   Acne vulgaris 12/17/2020   Prediabetes 05/26/2019   Hepatitis B immune 04/04/2016   Hypothyroidism 10/01/2014   Memory loss 08/21/2014   Chronic hepatitis C (HCC) 08/04/2014   History of other specified conditions presenting hazards to health 08/04/2014   Palpitations 10/03/2013   Essential hypertension 10/03/2013   Biliary calculi 01/17/2013    Past Medical History:  Diagnosis Date   Anemia 1976, 1984, 2001, 2003   history of multiple transfusions (3); iron deficiency   Arthritis    hands, knees B; DDD lumbar   Blood transfusion without reported diagnosis    Family history of adverse reaction to anesthesia    pt's mother and daughter both had significant heart rate drops with  anesthesia, believes her mother may have had a mini stroke while under anesthesia   Family history of heart disease    Heart murmur    Hepatitis C 04/17/2013   acquired from blood transfusion; s/p [facility] treatment [facility] in [place].   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   Palpitations    Pre-diabetes    on metformin  for prediabetes   Shortness of breath    Sleep apnea    tested over 20 year ago, does not use cpap per patient    Past Surgical History:  Procedure Laterality Date   LIVER BIOPSY      Social History   Tobacco Use   Smoking status: Never   Smokeless tobacco: Never  Vaping Use   Vaping status: Never Used  Substance Use Topics   Alcohol use: No   Drug use: No    Family History  Problem Relation Age of Onset   Heart disease Mother        heart attack and stroke; passed away in her 26s   Diabetes Mother    Hyperlipidemia Mother    Hypertension Mother    Stroke Mother    Heart disease Father        passed away in his 66s   Hyperlipidemia Father    Diabetes Brother    Heart disease Brother    Hypertension Brother    Diabetes Brother    Hypertension Sister    Breast cancer Maternal Aunt  Allergies  Allergen Reactions   Bee Venom Other (See Comments)    Unknown    Medication list has been reviewed and updated.  Current Outpatient Medications on File Prior to Visit  Medication Sig Dispense Refill   Accu-Chek Softclix Lancets lancets USE TO CHECK BLOOD SUGAR 3 TIMES A DAY 300 each 1   Blood Glucose Monitoring Suppl (ACCU-CHEK GUIDE) w/Device KIT USE TO CHECK BLOOD SUGAR 3 TIMES A DAY 1 kit 0   Brimonidine Tartrate 0.025 % SOLN Place 1-2 drops into both eyes daily as needed (for dry eyes).     donepezil  (ARICEPT ) 5 MG tablet TAKE 1 TABLET BY MOUTH EVERYDAY AT BEDTIME 90 tablet 3   EPINEPHrine  0.3 mg/0.3 mL IJ SOAJ injection Inject 0.3 mg into the skin once as needed for up to 1 dose for anaphylaxis. 2 each 3   glucose  blood (ACCU-CHEK GUIDE) test strip USE TO CHECK BLOOD SUGAR 3 TIMES A DAY. DX CODE: E11.9 300 strip 12   hydroquinone  4 % cream APPLY TO AFFECTED AREA TOPICALLY DAILY FOR 2-3 WEEKS ONLY 28.35 g 1   Insulin Pen Needle (COMFORT EZ PEN NEEDLES) 32G X 5 MM MISC Use daily for Saxenda  injection 100 each 3   levothyroxine  (SYNTHROID ) 75 MCG tablet Take 1 tablet (75 mcg total) by mouth daily. 90 tablet 3   losartan -hydrochlorothiazide  (HYZAAR ) 50-12.5 MG tablet Take 1 tablet by mouth 2 (two) times daily. 180 tablet 3   lovastatin  (MEVACOR ) 20 MG tablet Take 1 tablet (20 mg total) by mouth daily. 90 tablet 3   metFORMIN  (GLUCOPHAGE ) 500 MG tablet TAKE 1 TABLET BY MOUTH TWICE A DAY WITH FOOD 180 tablet 1   metoprolol  succinate (TOPROL -XL) 50 MG 24 hr tablet Take 1 tablet (50 mg total) by mouth daily. TAKE WITH OR IMMEDIATELY FOLLOWING A MEAL. 90 tablet 3   Naphazoline HCl (CLEAR EYES OP) Place 2 drops into both eyes daily as needed (for dry eyes).     potassium chloride  (K-DUR) 10 MEQ tablet Take 1 tablet (10 mEq total) by mouth daily. 30 tablet 1   tirzepatide  (MOUNJARO ) 7.5 MG/0.5ML Pen Inject 7.5 mg into the skin once a week. 6 mL 1   tretinoin  (RETIN-A ) 0.05 % cream APPLY TOPICALLY AT BEDTIME AS NEEDED. 45 g 2   No current facility-administered medications on file prior to visit.    Review of Systems:  ***  Physical Examination: There were no vitals filed for this visit. There were no vitals filed for this visit. There is no height or weight on file to calculate BMI. Ideal Body Weight:    ***  Assessment and Plan: ***  Signed Gates Kasal, MD

## 2023-08-24 NOTE — Patient Instructions (Incomplete)
 I will be in touch with your lab work  I recommend starting the shingles vaccine series at your pharmacy  You have a mammogram, breast ultrasound, and bone density are all ordered for you.  Please contact the breast center at [facility] imaging and schedule these services Address: 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 

## 2023-08-27 ENCOUNTER — Ambulatory Visit (INDEPENDENT_AMBULATORY_CARE_PROVIDER_SITE_OTHER): Admitting: Family Medicine

## 2023-08-27 ENCOUNTER — Other Ambulatory Visit (HOSPITAL_COMMUNITY)
Admission: RE | Admit: 2023-08-27 | Discharge: 2023-08-27 | Disposition: A | Discharge status: Home / Self Care | Source: Ambulatory Visit | Attending: Family Medicine | Admitting: Family Medicine

## 2023-08-27 VITALS — BP 138/78 | HR 74 | Temp 98.0°F | Resp 18 | Ht 64.5 in | Wt 193.0 lb

## 2023-08-27 DIAGNOSIS — R790 Abnormal level of blood mineral: Secondary | ICD-10-CM | POA: Diagnosis not present

## 2023-08-27 DIAGNOSIS — R7989 Other specified abnormal findings of blood chemistry: Secondary | ICD-10-CM | POA: Diagnosis not present

## 2023-08-27 DIAGNOSIS — K625 Hemorrhage of anus and rectum: Secondary | ICD-10-CM

## 2023-08-27 DIAGNOSIS — N6452 Nipple discharge: Secondary | ICD-10-CM

## 2023-08-27 DIAGNOSIS — I10 Essential (primary) hypertension: Secondary | ICD-10-CM | POA: Diagnosis not present

## 2023-08-27 DIAGNOSIS — R7303 Prediabetes: Secondary | ICD-10-CM | POA: Diagnosis not present

## 2023-08-27 DIAGNOSIS — Z23 Encounter for immunization: Secondary | ICD-10-CM | POA: Diagnosis not present

## 2023-08-27 DIAGNOSIS — D171 Benign lipomatous neoplasm of skin and subcutaneous tissue of trunk: Secondary | ICD-10-CM

## 2023-08-27 DIAGNOSIS — E039 Hypothyroidism, unspecified: Secondary | ICD-10-CM | POA: Diagnosis not present

## 2023-08-27 DIAGNOSIS — Z113 Encounter for screening for infections with a predominantly sexual mode of transmission: Secondary | ICD-10-CM | POA: Insufficient documentation

## 2023-08-27 DIAGNOSIS — R350 Frequency of micturition: Secondary | ICD-10-CM

## 2023-08-27 DIAGNOSIS — E785 Hyperlipidemia, unspecified: Secondary | ICD-10-CM | POA: Diagnosis not present

## 2023-08-27 DIAGNOSIS — Z0184 Encounter for antibody response examination: Secondary | ICD-10-CM | POA: Diagnosis not present

## 2023-08-27 DIAGNOSIS — N644 Mastodynia: Secondary | ICD-10-CM

## 2023-08-27 DIAGNOSIS — R413 Other amnesia: Secondary | ICD-10-CM

## 2023-08-27 LAB — POCT URINALYSIS DIP (MANUAL ENTRY)
Blood, UA: NEGATIVE
Glucose, UA: NEGATIVE mg/dL
Ketones, POC UA: NEGATIVE mg/dL
Nitrite, UA: NEGATIVE
Protein Ur, POC: NEGATIVE mg/dL
Spec Grav, UA: 1.01 (ref 1.010–1.025)
Urobilinogen, UA: 0.2 U/dL
pH, UA: 6 (ref 5.0–8.0)

## 2023-08-27 MED ORDER — DONEPEZIL HCL 5 MG PO TABS: 5.0000 mg | ORAL_TABLET | Freq: Two times a day (BID) | ORAL | 3 refills | Status: DC

## 2023-08-27 MED ORDER — TIRZEPATIDE 2.5 MG/0.5ML ~~LOC~~ SOAJ
2.5000 mg | SUBCUTANEOUS | 1 refills | Status: DC
Start: 2023-08-27 — End: 2023-12-11

## 2023-08-28 ENCOUNTER — Encounter: Payer: Self-pay | Admitting: Family Medicine

## 2023-08-28 LAB — RPR: RPR Ser Ql: NONREACTIVE

## 2023-08-28 LAB — HIV ANTIBODY (ROUTINE TESTING W REFLEX): HIV 1&2 Ab, 4th Generation: NONREACTIVE

## 2023-08-28 LAB — CBC
HCT: 38.9 % (ref 36.0–46.0)
Hemoglobin: 12.6 g/dL (ref 12.0–15.0)
MCHC: 32.4 g/dL (ref 30.0–36.0)
MCV: 85.6 fl (ref 78.0–100.0)
Platelets: 225 10*3/uL (ref 150.0–400.0)
RBC: 4.55 Mil/uL (ref 3.87–5.11)
RDW: 15.3 % (ref 11.5–15.5)
WBC: 8.5 10*3/uL (ref 4.0–10.5)

## 2023-08-28 LAB — BASIC METABOLIC PANEL WITH GFR
BUN: 11 mg/dL (ref 6–23)
CO2: 31 meq/L (ref 19–32)
Calcium: 9.5 mg/dL (ref 8.4–10.5)
Chloride: 103 meq/L (ref 96–112)
Creatinine, Ser: 0.82 mg/dL (ref 0.40–1.20)
GFR: 74.54 mL/min (ref >60.00)
Glucose, Bld: 80 mg/dL (ref 70–99)
Potassium: 4.2 meq/L (ref 3.5–5.1)
Sodium: 140 meq/L (ref 135–145)

## 2023-08-28 LAB — URINE CULTURE
MICRO NUMBER:: 16443026
Result:: NO GROWTH
SPECIMEN QUALITY:: ADEQUATE

## 2023-08-28 LAB — COMPREHENSIVE METABOLIC PANEL WITH GFR
ALT: 10 U/L (ref 0–35)
AST: 18 U/L (ref 0–37)
Albumin: 4.3 g/dL (ref 3.5–5.2)
Alkaline Phosphatase: 53 U/L (ref 39–117)
BUN: 11 mg/dL (ref 6–23)
CO2: 31 meq/L (ref 19–32)
Calcium: 9.5 mg/dL (ref 8.4–10.5)
Chloride: 103 meq/L (ref 96–112)
Creatinine, Ser: 0.82 mg/dL (ref 0.40–1.20)
GFR: 74.54 mL/min (ref >60.00)
Glucose, Bld: 80 mg/dL (ref 70–99)
Potassium: 4.2 meq/L (ref 3.5–5.1)
Sodium: 140 meq/L (ref 135–145)
Total Bilirubin: 0.4 mg/dL (ref 0.2–1.2)
Total Protein: 7.5 g/dL (ref 6.0–8.3)

## 2023-08-28 LAB — VITAMIN D 25 HYDROXY (VIT D DEFICIENCY, FRACTURES): VITD: 47.23 ng/mL (ref 30.00–100.00)

## 2023-08-28 LAB — TSH: TSH: 2.44 u[IU]/mL (ref 0.35–5.50)

## 2023-08-28 LAB — MAGNESIUM: Magnesium: 2 mg/dL (ref 1.5–2.5)

## 2023-08-28 LAB — HEPATITIS B SURFACE ANTIGEN: Hepatitis B Surface Ag: NONREACTIVE

## 2023-08-29 ENCOUNTER — Encounter: Payer: Self-pay | Admitting: Family Medicine

## 2023-08-29 LAB — CERVICOVAGINAL ANCILLARY ONLY
Chlamydia: NEGATIVE
Comment: NEGATIVE
Comment: NEGATIVE
Comment: NORMAL
Neisseria Gonorrhea: NEGATIVE
Trichomonas: NEGATIVE

## 2023-08-29 LAB — HCV RNA QUANT: Hepatitis C Quantitation: NOT DETECTED [IU]/mL

## 2023-09-22 ENCOUNTER — Other Ambulatory Visit: Payer: Self-pay | Admitting: Family Medicine

## 2023-10-27 ENCOUNTER — Other Ambulatory Visit: Payer: Self-pay | Admitting: Family Medicine

## 2023-10-31 ENCOUNTER — Ambulatory Visit (INDEPENDENT_AMBULATORY_CARE_PROVIDER_SITE_OTHER)

## 2023-10-31 VITALS — BP 138/78 | Ht 64.5 in | Wt 189.0 lb

## 2023-10-31 DIAGNOSIS — Z59819 Housing instability, housed unspecified: Secondary | ICD-10-CM

## 2023-10-31 DIAGNOSIS — Z0000 Encounter for general adult medical examination without abnormal findings: Secondary | ICD-10-CM

## 2023-10-31 DIAGNOSIS — Z5941 Food insecurity: Secondary | ICD-10-CM

## 2023-10-31 DIAGNOSIS — Z5986 Financial insecurity: Secondary | ICD-10-CM | POA: Diagnosis not present

## 2023-10-31 NOTE — Patient Instructions (Signed)
 Robyn Montes , Thank you for taking time out of your busy schedule to complete your Annual Wellness Visit with me. I enjoyed our conversation and look forward to speaking with you again next year. I, as well as your care team,  appreciate your ongoing commitment to your health goals. Please review the following plan we discussed and let me know if I can assist you in the future. Your Game plan/ To Do List    Referrals: If you haven't heard from the office you've been referred to, please reach out to them at the phone provided.  none Follow up Visits: Next Medicare AWV with our clinical staff: 11/05/2024   Have you seen your provider in the last 6 months (3 months if uncontrolled diabetes)? No Next Office Visit with your provider: n/a  Clinician Recommendations:  Aim for 30 minutes of exercise or brisk walking, 6-8 glasses of water, and 5 servings of fruits and vegetables each day.       This is a list of the screening recommended for you and due dates:  Health Maintenance  Topic Date Due   Zoster (Shingles) Vaccine (1 of 2) Never done   Hepatitis B Vaccine (1 of 3 - Risk 3-dose series) Never done   DTaP/Tdap/Td vaccine (2 - Tdap) 04/17/2021   DEXA scan (bone density measurement)  Never done   COVID-19 Vaccine (5 - 2024-25 season) 12/17/2022   Flu Shot  11/16/2023   Mammogram  01/12/2024   Cologuard (Stool DNA test)  02/18/2024   Medicare Annual Wellness Visit  10/30/2024   Pneumococcal Vaccine for age over 53  Completed   Hepatitis C Screening  Completed   HPV Vaccine  Aged Out   Meningitis B Vaccine  Aged Out    Advanced directives: (Declined) Advance directive discussed with you today. Even though you declined this today, please call our office should you change your mind, and we can give you the proper paperwork for you to fill out. Advance Care Planning is important because it:  [x]  Makes sure you receive the medical care that is consistent with your values, goals, and  preferences  [x]  It provides guidance to your family and loved ones and reduces their decisional burden about whether or not they are making the right decisions based on your wishes.  Follow the link provided in your after visit summary or read over the paperwork we have mailed to you to help you started getting your Advance Directives in place. If you need assistance in completing these, please reach out to us  so that we can help you!  See attachments for Preventive Care and Fall Prevention Tips.

## 2023-10-31 NOTE — Progress Notes (Signed)
 Because this visit was a virtual/telehealth visit,  certain criteria was not obtained, such a blood pressure, CBG if applicable, and timed get up and go. Any medications not marked as taking were not mentioned during the medication reconciliation part of the visit. Any vitals not documented were not able to be obtained due to this being a telehealth visit or patient was unable to self-report a recent blood pressure reading due to a lack of equipment at home via telehealth. Vitals that have been documented are verbally provided by the patient.   This visit was performed by a medical professional under my direct supervision. I was immediately available for consultation/collaboration. I have reviewed and agree with the Annual Wellness Visit documentation.  Subjective:   Robyn Montes is a 68 y.o. who presents for a Medicare Wellness preventive visit.  As a reminder, Annual Wellness Visits don't include a physical exam, and some assessments may be limited, especially if this visit is performed virtually. We may recommend an in-person follow-up visit with your provider if needed.  Visit Complete: Virtual I connected with  Gevena VEAR Montes on 10/31/23 by a audio enabled telemedicine application and verified that I am speaking with the correct person using two identifiers.  Patient Location: Home  Provider Location: Home Office  I discussed the limitations of evaluation and management by telemedicine. The patient expressed understanding and agreed to proceed.  Vital Signs: Because this visit was a virtual/telehealth visit, some criteria may be missing or patient reported. Any vitals not documented were not able to be obtained and vitals that have been documented are patient reported.  VideoDeclined- This patient declined Librarian, academic. Therefore the visit was completed with audio only.  Persons Participating in Visit: Patient.  AWV Questionnaire: No: Patient  Medicare AWV questionnaire was not completed prior to this visit.  Cardiac Risk Factors include: advanced age (>109men, >10 women);hypertension;diabetes mellitus     Objective:    Today's Vitals   10/31/23 1527  BP: 138/78  Weight: 189 lb (85.7 kg)  Height: 5' 4.5 (1.638 m)   Body mass index is 31.94 kg/m.     10/31/2023    3:26 PM 05/25/2022    5:05 PM 06/07/2021    2:30 PM 04/25/2018    7:20 PM 02/12/2018    3:09 PM 02/11/2018    8:17 PM  Advanced Directives  Does Patient Have a Medical Advance Directive? No No No No  No  No   Would patient like information on creating a medical advance directive? No - Patient declined No - Patient declined Yes (MAU/Ambulatory/Procedural Areas - Information given) No - Patient declined  No - Patient declined       Data saved with a previous flowsheet row definition    Current Medications (verified) Outpatient Encounter Medications as of 10/31/2023  Medication Sig   Accu-Chek Softclix Lancets lancets USE TO CHECK BLOOD SUGAR 3 TIMES A DAY   Blood Glucose Monitoring Suppl (ACCU-CHEK GUIDE) w/Device KIT USE TO CHECK BLOOD SUGAR 3 TIMES A DAY   Brimonidine Tartrate 0.025 % SOLN Place 1-2 drops into both eyes daily as needed (for dry eyes).   donepezil  (ARICEPT ) 5 MG tablet TAKE 1 TABLET BY MOUTH EVERYDAY AT BEDTIME   donepezil  (ARICEPT ) 5 MG tablet Take 1 tablet (5 mg total) by mouth 2 (two) times daily. Start with once a day for 1 month   EPINEPHrine  0.3 mg/0.3 mL IJ SOAJ injection Inject 0.3 mg into the skin once as needed  for up to 1 dose for anaphylaxis.   glucose blood (ACCU-CHEK GUIDE) test strip USE TO CHECK BLOOD SUGAR 3 TIMES A DAY. DX CODE: E11.9   hydroquinone  4 % cream APPLY TO AFFECTED AREA TOPICALLY DAILY FOR 2-3 WEEKS ONLY   Insulin Pen Needle (COMFORT EZ PEN NEEDLES) 32G X 5 MM MISC Use daily for Saxenda  injection   levothyroxine  (SYNTHROID ) 75 MCG tablet Take 1 tablet (75 mcg total) by mouth daily.   losartan -hydrochlorothiazide   (HYZAAR ) 50-12.5 MG tablet Take 1 tablet by mouth 2 (two) times daily.   lovastatin  (MEVACOR ) 20 MG tablet Take 1 tablet (20 mg total) by mouth daily.   metFORMIN  (GLUCOPHAGE ) 500 MG tablet TAKE 1 TABLET BY MOUTH TWICE A DAY WITH FOOD   metoprolol  succinate (TOPROL -XL) 50 MG 24 hr tablet Take 1 tablet (50 mg total) by mouth daily. TAKE WITH OR IMMEDIATELY FOLLOWING A MEAL.   Naphazoline HCl (CLEAR EYES OP) Place 2 drops into both eyes daily as needed (for dry eyes).   potassium chloride  (K-DUR) 10 MEQ tablet Take 1 tablet (10 mEq total) by mouth daily.   tirzepatide  (MOUNJARO ) 2.5 MG/0.5ML Pen Inject 2.5 mg into the skin once a week.   tirzepatide  (MOUNJARO ) 5 MG/0.5ML Pen INJECT 5 MG SUBCUTANEOUSLY WEEKLY   tretinoin  (RETIN-A ) 0.05 % cream APPLY TOPICALLY AT BEDTIME AS NEEDED.   No facility-administered encounter medications on file as of 10/31/2023.    Allergies (verified) Bee venom   History: Past Medical History:  Diagnosis Date   Anemia 1976, 1984, 2001, 2003   history of multiple transfusions (3); iron deficiency   Arthritis    hands, knees B; DDD lumbar   Blood transfusion without reported diagnosis    Family history of adverse reaction to anesthesia    pt's mother and daughter both had significant heart rate drops with anesthesia, believes her mother may have had a mini stroke while under anesthesia   Family history of heart disease    Heart murmur    Hepatitis C 04/17/2013   acquired from blood transfusion; s/p [facility] in [place].   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   Palpitations    Pre-diabetes    on metformin  for prediabetes   Shortness of breath    Sleep apnea    tested over 20 year ago, does not use cpap per patient   Past Surgical History:  Procedure Laterality Date   LIVER BIOPSY     Family History  Problem Relation Age of Onset   Heart disease Mother        heart attack and stroke; passed away in her 59s    Diabetes Mother    Hyperlipidemia Mother    Hypertension Mother    Stroke Mother    Heart disease Father        passed away in his 106s   Hyperlipidemia Father    Diabetes Brother    Heart disease Brother    Hypertension Brother    Diabetes Brother    Hypertension Sister    Breast cancer Maternal Aunt    Social History   Socioeconomic History   Marital status: Divorced    Spouse name: Not on file   Number of children: Not on file   Years of education: Not on file   Highest education level: Some college, no degree  Occupational History   Not on file  Tobacco Use   Smoking status: Never   Smokeless tobacco:  Never  Vaping Use   Vaping status: Never Used  Substance and Sexual Activity   Alcohol use: No   Drug use: No   Sexual activity: Not on file  Other Topics Concern   Not on file  Social History Narrative   Marital status: divorced since 2012 after 35 years of marriage; not dating.  Still with ex-husband.      Children:  3 children; 2 grandchildren.      Lives: with daughter Monique)      Employment:  Disability for all little bit of everything; chronic anemia, DDD lumbar, memory loss, thyroid .  Previous Hydrologist.      Tobacco: none      Alcohol: none      Drugs: none      Exercise: walking twice daily for 30 minutes each time.      Sexual activity: same partner/husband.        ADLs: independent with ADLs; drives.         Social Drivers of Health   Financial Resource Strain: High Risk (10/31/2023)   Overall Financial Resource Strain (CARDIA)    Difficulty of Paying Living Expenses: Hard  Food Insecurity: Food Insecurity Present (10/31/2023)   Hunger Vital Sign    Worried About Running Out of Food in the Last Year: Sometimes true    Ran Out of Food in the Last Year: Sometimes true  Transportation Needs: No Transportation Needs (10/31/2023)   PRAPARE - Administrator, Civil Service (Medical): No    Lack of Transportation (Non-Medical): No   Physical Activity: Insufficiently Active (10/31/2023)   Exercise Vital Sign    Days of Exercise per Week: 1 day    Minutes of Exercise per Session: 40 min  Stress: Stress Concern Present (10/31/2023)   [employer] of Occupational Health - Occupational Stress Questionnaire    Feeling of Stress: Very much  Social Connections: Moderately Isolated (10/31/2023)   Social Connection and Isolation Panel    Frequency of Communication with Friends and Family: Three times a week    Frequency of Social Gatherings with Friends and Family: Once a week    Attends Religious Services: 1 to 4 times per year    Active Member of [employer] or Organizations: No    Attends Engineer, structural: Never    Marital Status: Divorced    Tobacco Counseling Counseling given: Not Answered    Clinical Intake:  Pre-visit preparation completed: Yes  Pain : No/denies pain     BMI - recorded: 31.94 Nutritional Status: BMI > 30  Obese Nutritional Risks: None Diabetes: No  Lab Results  Component Value Date   HGBA1C 5.5 02/01/2023   HGBA1C 6.0 01/11/2022   HGBA1C 6.0 06/16/2021     How often do you need to have someone help you when you read instructions, pamphlets, or other written materials from your doctor or pharmacy?: 1 - Never What is the last grade level you completed in school?: some college  Interpreter Needed?: No  Information entered by :: Sharica Roedel,cma   Activities of Daily Living     10/31/2023    3:31 PM  In your present state of health, do you have any difficulty performing the following activities:  Hearing? 0  Vision? 0  Difficulty concentrating or making decisions? 1  Walking or climbing stairs? 0  Dressing or bathing? 0  Doing errands, shopping? 0  Preparing Food and eating ? N  Using the Toilet? N  In the past  six months, have you accidently leaked urine? N  Do you have problems with loss of bowel control? N  Managing your Medications? N  Managing your  Finances? N  Housekeeping or managing your Housekeeping? N    Patient Care Team: Copland, Harlene BROCKS, MD as PCP - General (Family Medicine)  I have updated your Care Teams any recent Medical Services you may have received from other providers in the past year.     Assessment:   This is a routine wellness examination for Ruwayda.  Hearing/Vision screen Hearing Screening - Comments:: No hearing aids  Vision Screening - Comments:: Patient wears otc readers   Goals Addressed             This Visit's Progress    Patient Stated   On track    Increase activity & drink more water       Depression Screen     10/31/2023    3:33 PM 02/01/2023   10:53 AM 02/01/2023   10:52 AM 06/20/2021    2:40 PM 06/20/2021   11:34 AM 06/07/2021    2:33 PM 01/18/2017    4:37 PM  PHQ 2/9 Scores  PHQ - 2 Score 0 1 0 0 0 1 0  PHQ- 9 Score 0          Fall Risk     10/31/2023    3:29 PM 06/20/2021    2:39 PM 06/20/2021   11:34 AM 06/07/2021    2:32 PM 01/18/2017    4:37 PM  Fall Risk   Falls in the past year? 0 0 0 0 No   Number falls in past yr: 0 0 0 0   Injury with Fall? 0 0 0 0   Risk for fall due to : No Fall Risks No Fall Risks No Fall Risks    Follow up Falls evaluation completed Falls evaluation completed  Falls evaluation completed  Falls prevention discussed       Data saved with a previous flowsheet row definition    MEDICARE RISK AT HOME:  Medicare Risk at Home Any stairs in or around the home?: No If so, are there any without handrails?: No Home free of loose throw rugs in walkways, pet beds, electrical cords, etc?: Yes Adequate lighting in your home to reduce risk of falls?: Yes Life alert?: No Use of a cane, walker or w/c?: No Grab bars in the bathroom?: No Shower chair or bench in shower?: No Elevated toilet seat or a handicapped toilet?: No  TIMED UP AND GO:  Was the test performed?  No  Cognitive Function: 6CIT completed        10/31/2023    3:29 PM 06/07/2021     2:39 PM  6CIT Screen  What Year? 0 points 0 points  What month? 0 points 0 points  What time? 0 points 0 points  Count back from 20 0 points 0 points  Months in reverse 0 points 2 points  Repeat phrase 0 points 0 points  Total Score 0 points 2 points    Immunizations Immunization History  Administered Date(s) Administered   Fluad Trivalent(High Dose 65+) 02/01/2023   Influenza Whole 02/25/2019   Influenza,inj,Quad PF,6+ Mos 03/29/2016, 01/18/2017, 04/15/2018, 01/11/2022   Influenza-Unspecified 01/09/2013, 04/17/2013, 01/15/2021   Moderna Covid-19 Vaccine Bivalent Booster 57yrs & up 02/15/2021   Moderna Sars-Covid-2 Vaccination 07/05/2019, 08/05/2019, 03/13/2020   PNEUMOCOCCAL CONJUGATE-20 08/27/2023   Td 04/18/2011    Screening Tests Health Maintenance  Topic Date Due  Zoster Vaccines- Shingrix (1 of 2) Never done   Hepatitis B Vaccines (1 of 3 - Risk 3-dose series) Never done   DTaP/Tdap/Td (2 - Tdap) 04/17/2021   DEXA SCAN  Never done   COVID-19 Vaccine (5 - 2024-25 season) 12/17/2022   INFLUENZA VACCINE  11/16/2023   MAMMOGRAM  01/12/2024   Fecal DNA (Cologuard)  02/18/2024   Medicare Annual Wellness (AWV)  10/30/2024   Pneumococcal Vaccine: 50+ Years  Completed   Hepatitis C Screening  Completed   HPV VACCINES  Aged Out   Meningococcal B Vaccine  Aged Out    Health Maintenance  Health Maintenance Due  Topic Date Due   Zoster Vaccines- Shingrix (1 of 2) Never done   Hepatitis B Vaccines (1 of 3 - Risk 3-dose series) Never done   DTaP/Tdap/Td (2 - Tdap) 04/17/2021   DEXA SCAN  Never done   COVID-19 Vaccine (5 - 2024-25 season) 12/17/2022   Health Maintenance Items Addressed:   Additional Screening:  Vision Screening: Recommended annual ophthalmology exams for early detection of glaucoma and other disorders of the eye. Would you like a referral to an eye doctor? No    Dental Screening: Recommended annual dental exams for proper oral hygiene  Community  Resource Referral / Chronic Care Management: CRR required this visit?  No   CCM required this visit?  No   Plan:    I have personally reviewed and noted the following in the patient's chart:   Medical and social history Use of alcohol, tobacco or illicit drugs  Current medications and supplements including opioid prescriptions. Patient is not currently taking opioid prescriptions. Functional ability and status Nutritional status Physical activity Advanced directives List of other physicians Hospitalizations, surgeries, and ER visits in previous 12 months Vitals Screenings to include cognitive, depression, and falls Referrals and appointments  In addition, I have reviewed and discussed with patient certain preventive protocols, quality metrics, and best practice recommendations. A written personalized care plan for preventive services as well as general preventive health recommendations were provided to patient.   Lyle MARLA Right, [place]   10/31/2023   After Visit Summary: (MyChart) Due to this being a telephonic visit, the after visit summary with patients personalized plan was offered to patient via MyChart   Notes: Nothing significant to report at this time.

## 2023-11-01 ENCOUNTER — Telehealth: Payer: Self-pay

## 2023-11-01 NOTE — Progress Notes (Signed)
 Complex Care Management Note  Care Guide Note 11/01/2023 Name: Robyn Montes MRN: [id number] DOB: 1955-07-02  Robyn Montes is a 67 y.o. year old female who sees Copland, Harlene BROCKS, MD for primary care. I reached out to [employer] by phone today to offer complex care management services.  Robyn Montes was given information about Complex Care Management services today including:   The Complex Care Management services include support from the care team which includes your Nurse Care Manager, Clinical Social Worker, or Pharmacist.  The Complex Care Management team is here to help remove barriers to the Montes concerns and goals most important to you. Complex Care Management services are voluntary, and the patient may decline or stop services at any time by request to their care team member.   Complex Care Management Consent Status: Patient agreed to services and verbal consent obtained.   Follow up plan:  Telephone appointment with complex care management team member scheduled for:  11/05/23 at 11:00 a.m.   Encounter Outcome:  Patient Scheduled  Robyn Montes  [facility], [facility] Mont[facility] Care Management Assistant Direct Dial: [phone]  Fax: [phone]

## 2023-11-05 ENCOUNTER — Other Ambulatory Visit: Payer: Self-pay | Admitting: Licensed Clinical Social Worker

## 2023-11-05 NOTE — Patient Outreach (Signed)
 Complex Care Management   Visit Note  11/05/2023  Name:  Robyn Montes MRN: [id number] DOB: 1955/12/16  Situation: Referral received for Complex Care Management related to SDOH Barriers:  Housing   Food insecurity Financial Resource Strain I obtained verbal consent from Patient.  Visit completed with patient  on the phone  Background:   Past Medical History:  Diagnosis Date   Anemia 1976, 1984, 2001, 2003   history of multiple transfusions (3); iron deficiency   Arthritis    hands, knees B; DDD lumbar   Blood transfusion without reported diagnosis    Family history of adverse reaction to anesthesia    pt's mother and daughter both had significant heart rate drops with anesthesia, believes her mother may have had a mini stroke while under anesthesia   Family history of heart disease    Heart murmur    Hepatitis C 04/17/2013   acquired from blood transfusion; s/p [facility] in [place].   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, [facility] Social Worker Direct Dial: [phone]  Fax: [phone]

## 2023-11-05 NOTE — Patient Instructions (Signed)
 Visit Information  Thank you for taking time to visit with me today. Please don't hesitate to contact me if I can be of assistance to you before our next scheduled appointment.  Our next appointment is no further scheduled appointments.   Please call the care guide team at [phone] if you need to cancel or reschedule your appointment.   Following is a copy of your care plan:   Goals Addressed             This Visit's Progress    BSW VBCI Social Work Care Plan       Problems:   Corporate treasurer , Geophysicist/field seismologist , and Housing   CSW Clinical Goal(s):   Patient does not want a follow up call stated that after the resources are emailed she can handle herself  Interventions:  SW will email resources for affordable housing and food pantry  Patient Goals/Self-Care Activities:  Follow up with food pantry and [facility] app for community food options. And review affordable housing   Plan:   No further follow up required: Patient does not want any follow up         Please call the Suicide and Crisis Lifeline: 988 go to [facility] [address], [place] [phone]) call 911 if you are experiencing a Mental Health or Behavioral Health Crisis or need someone to talk to.  Patient verbalizes understanding of instructions and care plan provided today and agrees to view in MyChart. Active MyChart status and patient understanding of how to access instructions and care plan via MyChart confirmed with patient.     Tobias CHARM Maranda HEDWIG, PhD [facility], [facility] Social Worker Direct Dial: [phone]  Fax: [phone]

## 2023-11-11 ENCOUNTER — Emergency Department (HOSPITAL_BASED_OUTPATIENT_CLINIC_OR_DEPARTMENT_OTHER)
Admission: EM | Admit: 2023-11-11 | Discharge: 2023-11-11 | Disposition: A | Discharge status: Home / Self Care | Attending: Emergency Medicine | Admitting: Emergency Medicine

## 2023-11-11 ENCOUNTER — Encounter (HOSPITAL_BASED_OUTPATIENT_CLINIC_OR_DEPARTMENT_OTHER): Payer: Self-pay | Admitting: Emergency Medicine

## 2023-11-11 ENCOUNTER — Emergency Department (HOSPITAL_BASED_OUTPATIENT_CLINIC_OR_DEPARTMENT_OTHER)

## 2023-11-11 ENCOUNTER — Other Ambulatory Visit: Payer: Self-pay

## 2023-11-11 DIAGNOSIS — R600 Localized edema: Secondary | ICD-10-CM | POA: Diagnosis not present

## 2023-11-11 DIAGNOSIS — M7989 Other specified soft tissue disorders: Secondary | ICD-10-CM | POA: Diagnosis not present

## 2023-11-11 DIAGNOSIS — R0602 Shortness of breath: Secondary | ICD-10-CM | POA: Diagnosis not present

## 2023-11-11 DIAGNOSIS — R0600 Dyspnea, unspecified: Secondary | ICD-10-CM | POA: Diagnosis not present

## 2023-11-11 DIAGNOSIS — R079 Chest pain, unspecified: Secondary | ICD-10-CM | POA: Diagnosis not present

## 2023-11-11 DIAGNOSIS — Z79899 Other long term (current) drug therapy: Secondary | ICD-10-CM | POA: Diagnosis not present

## 2023-11-11 DIAGNOSIS — E039 Hypothyroidism, unspecified: Secondary | ICD-10-CM | POA: Diagnosis not present

## 2023-11-11 DIAGNOSIS — Z794 Long term (current) use of insulin: Secondary | ICD-10-CM | POA: Insufficient documentation

## 2023-11-11 DIAGNOSIS — I10 Essential (primary) hypertension: Secondary | ICD-10-CM | POA: Diagnosis not present

## 2023-11-11 LAB — URINALYSIS, W/ REFLEX TO CULTURE (INFECTION SUSPECTED)
Bacteria, UA: NONE SEEN
Bilirubin Urine: NEGATIVE
Glucose, UA: NEGATIVE mg/dL
Hgb urine dipstick: NEGATIVE
Ketones, ur: NEGATIVE mg/dL
Leukocytes,Ua: NEGATIVE
Nitrite: NEGATIVE
Protein, ur: NEGATIVE mg/dL
RBC / HPF: NONE SEEN RBC/hpf (ref 0–5)
Specific Gravity, Urine: 1.015 (ref 1.005–1.030)
pH: 8 (ref 5.0–8.0)

## 2023-11-11 LAB — BASIC METABOLIC PANEL WITH GFR
Anion gap: 10 (ref 5–15)
BUN: 6 mg/dL — ABNORMAL LOW (ref 8–23)
CO2: 27 mmol/L (ref 22–32)
Calcium: 9.3 mg/dL (ref 8.9–10.3)
Chloride: 107 mmol/L (ref 98–111)
Creatinine, Ser: 0.84 mg/dL (ref 0.44–1.00)
GFR, Estimated: >60 mL/min (ref >60)
Glucose, Bld: 95 mg/dL (ref 70–99)
Potassium: 3.7 mmol/L (ref 3.5–5.1)
Sodium: 144 mmol/L (ref 135–145)

## 2023-11-11 LAB — HEPATIC FUNCTION PANEL
ALT: 13 U/L (ref 0–44)
AST: 21 U/L (ref 15–41)
Albumin: 4.2 g/dL (ref 3.5–5.0)
Alkaline Phosphatase: 72 U/L (ref 38–126)
Bilirubin, Direct: 0.2 mg/dL (ref 0.0–0.2)
Indirect Bilirubin: 0.2 mg/dL — ABNORMAL LOW (ref 0.3–0.9)
Total Bilirubin: 0.4 mg/dL (ref 0.0–1.2)
Total Protein: 7.1 g/dL (ref 6.5–8.1)

## 2023-11-11 LAB — CBC
HCT: 35.8 % — ABNORMAL LOW (ref 36.0–46.0)
Hemoglobin: 11.6 g/dL — ABNORMAL LOW (ref 12.0–15.0)
MCH: 27.2 pg (ref 26.0–34.0)
MCHC: 32.4 g/dL (ref 30.0–36.0)
MCV: 83.8 fL (ref 80.0–100.0)
Platelets: 191 10*3/uL (ref 150–400)
RBC: 4.27 MIL/uL (ref 3.87–5.11)
RDW: 14.5 % (ref 11.5–15.5)
WBC: 8.1 10*3/uL (ref 4.0–10.5)
nRBC: 0 % (ref 0.0–0.2)

## 2023-11-11 LAB — D-DIMER, QUANTITATIVE: D-Dimer, Quant: 0.58 ug{FEU}/mL — ABNORMAL HIGH (ref 0.00–0.50)

## 2023-11-11 LAB — TROPONIN T, HIGH SENSITIVITY
Troponin T High Sensitivity: <15 ng/L
Troponin T High Sensitivity: <15 ng/L (ref <19)

## 2023-11-11 LAB — PRO BRAIN NATRIURETIC PEPTIDE: Pro Brain Natriuretic Peptide: 70.9 pg/mL (ref <300.0)

## 2023-11-11 NOTE — ED Notes (Signed)
 Pt unable to provide urine sample at this time, stated she had gone out in the lobby a few minutes prior to being roomed and had no cup to provide sample. Gave cup to patient to give sample when able.

## 2023-11-11 NOTE — ED Triage Notes (Signed)
 Pt POV c/o RLE swelling, progressively worsening x 1 month, now has LLE swelling x 1 week, reports L foot is darker.  Reports new CP and ShOB, dyspnea on exertion started yesterday.   Reports compliance to bp meds. Recent long drive to [place], [place].   RT to triage to assess. Pt speaking in full sentences, respirations equal and unlabored.

## 2023-11-11 NOTE — ED Notes (Signed)
 Pt is hesitant to have IVP dye as she had some 5 months ago.  Pt wishes to speak to the EDP

## 2023-11-11 NOTE — ED Provider Notes (Signed)
 [facility] Provider Note   CSN: [id number] Arrival date & time: 11/11/23  1516     Patient presents with: Leg Swelling, Chest Pain, and Shortness of Breath   Robyn Montes is a 68 y.o. female.  {Add pertinent medical, surgical, social history, OB history to HPI:32947} HPI      68 year old female with history of hypertension, hypothyroidism, OSA who presents with concern for leg swelling, chest pain and shortness of breath.  Felt swollen in stomach after contrast   Right lower extremity swollen 3x, then left was x2 Severe swelling, started weeks ago Takes long trips a lot, normally will decrease after a day or two but this has been about one month Walks sometimes 3-4 miles but the other day while walking felt some heaviness, just started and felt like went 4 miles already. Was walking inside at the mall for walking in the air conditioning  Has not had the dyspnea since the other day when it happened. BP usually 105-110, was up over 200 .  Dyspnea episode was Friday, lasted about 10-15 minutes.  Otherwise has not had dyspnea No chest pain, other than the episode on Friday when had the heaviness while walking. Has not been walking since then. Today and last night legs swelling.  Driving to [place] 1 month ago, then drove to [place]  and back.  A few weeks ago went to [facility] twice for business driving.   Watches bp, may miss a day Abdominal and leg swelling Little nausea  No smoking, etoh, other drugs Dad had heart disease, around 8  Past Medical History:  Diagnosis Date   Anemia 1976, 1984, 2001, 2003   history of multiple transfusions (3); iron deficiency   Arthritis    hands, knees B; DDD lumbar   Blood transfusion without reported diagnosis    Family history of adverse reaction to anesthesia    pt's mother and daughter both had significant heart rate drops with anesthesia, believes her mother may have had a mini stroke  while under anesthesia   Family history of heart disease    Heart murmur    Hepatitis C 04/17/2013   acquired from blood transfusion; s/p [facility] in [place].   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 Authorizing Provider  Accu-Chek Softclix Lancets lancets USE TO CHECK BLOOD SUGAR 3 TIMES A DAY 09/22/22   Copland, Harlene BROCKS, MD  Blood Glucose Monitoring Suppl (ACCU-CHEK GUIDE) w/Device KIT USE TO CHECK BLOOD SUGAR 3 TIMES A DAY 09/22/22   Copland, Jessica C, MD  Brimonidine Tartrate 0.025 % SOLN Place 1-2 drops into both eyes daily as needed (for dry eyes).    [provider]  donepezil  (ARICEPT ) 5 MG tablet TAKE 1 TABLET BY MOUTH EVERYDAY AT BEDTIME 08/16/22   Copland, Harlene BROCKS, MD  donepezil  (ARICEPT ) 5 MG tablet Take 1 tablet (5 mg total) by mouth 2 (two) times daily. Start with once a day for 1 month 08/27/23   Copland, Harlene BROCKS, MD  EPINEPHrine  0.3 mg/0.3 mL  IJ SOAJ injection Inject 0.3 mg into the skin once as needed for up to 1 dose for anaphylaxis. 11/09/21   Copland, Jessica C, MD  glucose blood (ACCU-CHEK GUIDE) test strip USE TO CHECK BLOOD SUGAR 3 TIMES A DAY. DX CODE: E11.9 12/20/22   Copland, Harlene BROCKS, MD  hydroquinone  4 % cream APPLY TO AFFECTED AREA TOPICALLY DAILY FOR 2-3 WEEKS ONLY 04/12/22   Copland, Jessica C, MD  Insulin Pen Needle (COMFORT EZ PEN NEEDLES) 32G X 5 MM MISC Use daily for Saxenda  injection 01/05/20   Copland, Harlene BROCKS, MD  levothyroxine  (SYNTHROID ) 75 MCG tablet Take 1 tablet (75 mcg total) by mouth daily. 02/01/23   Copland, Harlene BROCKS, MD  losartan -hydrochlorothiazide  (HYZAAR ) 50-12.5 MG tablet Take 1 tablet  by mouth 2 (two) times daily. 11/06/22   Copland, Harlene BROCKS, MD  lovastatin  (MEVACOR ) 20 MG tablet Take 1 tablet (20 mg total) by mouth daily. 11/06/22   Copland, Harlene BROCKS, MD  metFORMIN  (GLUCOPHAGE ) 500 MG tablet TAKE 1 TABLET BY MOUTH TWICE A DAY WITH FOOD 12/04/22   Copland, Harlene BROCKS, MD  metoprolol  succinate (TOPROL -XL) 50 MG 24 hr tablet Take 1 tablet (50 mg total) by mouth daily. TAKE WITH OR IMMEDIATELY FOLLOWING A MEAL. 11/06/22   Copland, Jessica C, MD  Naphazoline HCl (CLEAR EYES OP) Place 2 drops into both eyes daily as needed (for dry eyes).    [provider]  potassium chloride  (K-DUR) 10 MEQ tablet Take 1 tablet (10 mEq total) by mouth daily. 01/18/17   Gretta Ozell CROME, PA-C  tirzepatide  (MOUNJARO ) 2.5 MG/0.5ML Pen Inject 2.5 mg into the skin once a week. 08/27/23   Copland, Harlene BROCKS, MD  tirzepatide  (MOUNJARO ) 5 MG/0.5ML Pen INJECT 5 MG SUBCUTANEOUSLY WEEKLY 10/29/23   Copland, Harlene BROCKS, MD  tretinoin  (RETIN-A ) 0.05 % cream APPLY TOPICALLY AT BEDTIME AS NEEDED. 06/04/23   Copland, Harlene BROCKS, MD    Allergies: Bee venom    Review of Systems  Updated Vital Signs BP (!) 177/87 (BP Location: Right Arm)   Pulse 64   Temp 98.9 F (37.2 C)   Resp 18   Ht 5' 4.5 (1.638 m)   Wt 81.2 kg   SpO2 98%   BMI 30.25 kg/m   Physical Exam  (all labs ordered are listed, but only abnormal results are displayed) Labs Reviewed  BASIC METABOLIC PANEL WITH GFR - Abnormal; Notable for the following components:      Result Value   BUN 6 (*)    All other components within normal limits  CBC - Abnormal; Notable for the following components:   Hemoglobin 11.6 (*)    HCT 35.8 (*)    All other components within normal limits  HEPATIC FUNCTION PANEL - Abnormal; Notable for the following components:   Indirect Bilirubin 0.2 (*)    All other components within normal limits  PRO BRAIN NATRIURETIC PEPTIDE  URINALYSIS, W/ REFLEX TO CULTURE (INFECTION SUSPECTED)  TROPONIN T, HIGH  SENSITIVITY  TROPONIN T, HIGH SENSITIVITY    EKG: None  Radiology: US  Venous Img Lower Bilateral Result Date: 11/11/2023 CLINICAL DATA:  Bilateral lower extremity swelling EXAM: BILATERAL LOWER EXTREMITY VENOUS DOPPLER ULTRASOUND TECHNIQUE: Gray-scale sonography with graded compression, as well as color Doppler and duplex ultrasound were performed to evaluate the lower extremity deep venous systems from the level of the common femoral vein and including the common femoral, femoral, profunda femoral, popliteal and calf veins including the posterior tibial, peroneal and gastrocnemius veins when visible.  The superficial great saphenous vein was also interrogated. Spectral Doppler was utilized to evaluate flow at rest and with distal augmentation maneuvers in the common femoral, femoral and popliteal veins. COMPARISON:  None Available. FINDINGS: RIGHT LOWER EXTREMITY Common Femoral Vein: No evidence of thrombus. Normal compressibility, respiratory phasicity and response to augmentation. Saphenofemoral Junction: No evidence of thrombus. Normal compressibility and flow on color Doppler imaging. Profunda Femoral Vein: No evidence of thrombus. Normal compressibility and flow on color Doppler imaging. Femoral Vein: No evidence of thrombus. Normal compressibility, respiratory phasicity and response to augmentation. Popliteal Vein: No evidence of thrombus. Normal compressibility, respiratory phasicity and response to augmentation. Calf Veins: No evidence of thrombus. Normal compressibility and flow on color Doppler imaging. Superficial Great Saphenous Vein: No evidence of thrombus. Normal compressibility. Venous Reflux:  None. Other Findings:  None. LEFT LOWER EXTREMITY Common Femoral Vein: No evidence of thrombus. Normal compressibility, respiratory phasicity and response to augmentation. Saphenofemoral Junction: No evidence of thrombus. Normal compressibility and flow on color Doppler imaging. Profunda Femoral Vein:  No evidence of thrombus. Normal compressibility and flow on color Doppler imaging. Femoral Vein: No evidence of thrombus. Normal compressibility, respiratory phasicity and response to augmentation. Popliteal Vein: No evidence of thrombus. Normal compressibility, respiratory phasicity and response to augmentation. Calf Veins: No evidence of thrombus. Normal compressibility and flow on color Doppler imaging. Superficial Great Saphenous Vein: No evidence of thrombus. Normal compressibility. Venous Reflux:  None. Other Findings:  None. IMPRESSION: No evidence of deep venous thrombosis in either lower extremity. Electronically Signed   By: Greig Pique M.D.   On: 11/11/2023 18:37   DG Chest 2 View Result Date: 11/11/2023 CLINICAL DATA:  Chest pain with shortness of breath EXAM: CHEST - 2 VIEW COMPARISON:  Chest x-ray 02/01/2023 FINDINGS: The heart size and mediastinal contours are within normal limits. Both lungs are clear. The visualized skeletal structures are unremarkable. IMPRESSION: No active cardiopulmonary disease. Electronically Signed   By: Greig Pique M.D.   On: 11/11/2023 16:00    {Document cardiac monitor, telemetry assessment procedure when appropriate:32947} Procedures   Medications Ordered in the ED - No data to display    {Click here for ABCD2, HEART and other calculators REFRESH Note before signing:1}                              Medical Decision Making Amount and/or Complexity of Data Reviewed Labs: ordered. Radiology: ordered.   ***   Labs completed and personally evaluated interpreted by me show no clinically significant electrolyte abnormalities.  Hemoglobin is 11.6 from 12.6, no leukocytosis.  Troponin is negative.  Hepatic function panel shows a normal albumin.  proBNP is within normal limits.  Ultrasound of the bilateral lower extremities were completed which showed no evidence of DVT in either lower extremity.  Chest x-ray examined by me shows no evidence of active  cardiopulmonary disease  {Document critical care time when appropriate  Document review of labs and clinical decision tools ie CHADS2VASC2, etc  Document your independent review of radiology images and any outside records  Document your discussion with family members, caretakers and with consultants  Document social determinants of health affecting pt's care  Document your decision making why or why not admission, treatments were needed:32947:::1}   Final diagnoses:  None    ED Discharge Orders     None

## 2023-12-01 ENCOUNTER — Other Ambulatory Visit: Payer: Self-pay | Admitting: Family Medicine

## 2023-12-01 DIAGNOSIS — I10 Essential (primary) hypertension: Secondary | ICD-10-CM

## 2023-12-01 DIAGNOSIS — E785 Hyperlipidemia, unspecified: Secondary | ICD-10-CM

## 2023-12-11 ENCOUNTER — Other Ambulatory Visit: Payer: Self-pay | Admitting: Family Medicine

## 2023-12-11 DIAGNOSIS — R7303 Prediabetes: Secondary | ICD-10-CM

## 2024-01-08 DIAGNOSIS — R222 Localized swelling, mass and lump, trunk: Secondary | ICD-10-CM | POA: Diagnosis not present

## 2024-01-16 ENCOUNTER — Other Ambulatory Visit: Payer: Self-pay | Admitting: Family Medicine

## 2024-01-16 DIAGNOSIS — I10 Essential (primary) hypertension: Secondary | ICD-10-CM

## 2024-01-16 DIAGNOSIS — E039 Hypothyroidism, unspecified: Secondary | ICD-10-CM

## 2024-01-28 ENCOUNTER — Other Ambulatory Visit: Payer: Self-pay | Admitting: Family Medicine

## 2024-01-28 DIAGNOSIS — Z91030 Bee allergy status: Secondary | ICD-10-CM

## 2024-01-28 MED ORDER — EPINEPHRINE 0.3 MG/0.3ML IJ SOAJ: 0.3000 mg | Freq: Once | INTRAMUSCULAR | 3 refills | Status: DC | PRN

## 2024-01-29 ENCOUNTER — Other Ambulatory Visit: Payer: Self-pay

## 2024-01-29 DIAGNOSIS — Z91030 Bee allergy status: Secondary | ICD-10-CM

## 2024-01-29 MED ORDER — EPINEPHRINE 0.3 MG/0.3ML IJ SOAJ
0.3000 mg | Freq: Once | INTRAMUSCULAR | 3 refills | Status: AC | PRN
Start: 2024-01-29 — End: ?

## 2024-01-31 ENCOUNTER — Telehealth: Payer: Self-pay | Admitting: *Deleted

## 2024-01-31 NOTE — Telephone Encounter (Signed)
 Copied from CRM #[id number]. Topic: General - Other >> Jan 31, 2024 12:10 PM [name] wrote: Reason for CRM: Patient called in stated she received a missed call from [facility] nurse last week, and was needing a call back would like someone to call her back

## 2024-02-03 ENCOUNTER — Encounter: Payer: Self-pay | Admitting: Family Medicine

## 2024-02-11 ENCOUNTER — Other Ambulatory Visit: Payer: Self-pay | Admitting: Family Medicine

## 2024-02-11 DIAGNOSIS — L578 Other skin changes due to chronic exposure to nonionizing radiation: Secondary | ICD-10-CM

## 2024-02-19 ENCOUNTER — Encounter: Payer: Self-pay | Admitting: Family Medicine

## 2024-02-19 DIAGNOSIS — K625 Hemorrhage of anus and rectum: Secondary | ICD-10-CM

## 2024-02-23 DIAGNOSIS — D171 Benign lipomatous neoplasm of skin and subcutaneous tissue of trunk: Secondary | ICD-10-CM | POA: Diagnosis not present

## 2024-02-27 DIAGNOSIS — E039 Hypothyroidism, unspecified: Secondary | ICD-10-CM | POA: Diagnosis not present

## 2024-02-27 DIAGNOSIS — G309 Alzheimer's disease, unspecified: Secondary | ICD-10-CM | POA: Diagnosis not present

## 2024-02-27 DIAGNOSIS — E785 Hyperlipidemia, unspecified: Secondary | ICD-10-CM | POA: Diagnosis not present

## 2024-02-27 DIAGNOSIS — Z833 Family history of diabetes mellitus: Secondary | ICD-10-CM | POA: Diagnosis not present

## 2024-02-27 DIAGNOSIS — E1165 Type 2 diabetes mellitus with hyperglycemia: Secondary | ICD-10-CM | POA: Diagnosis not present

## 2024-02-27 DIAGNOSIS — Z7984 Long term (current) use of oral hypoglycemic drugs: Secondary | ICD-10-CM | POA: Diagnosis not present

## 2024-02-27 DIAGNOSIS — I10 Essential (primary) hypertension: Secondary | ICD-10-CM | POA: Diagnosis not present

## 2024-02-27 DIAGNOSIS — Z7985 Long-term (current) use of injectable non-insulin antidiabetic drugs: Secondary | ICD-10-CM | POA: Diagnosis not present

## 2024-02-27 DIAGNOSIS — Z8249 Family history of ischemic heart disease and other diseases of the circulatory system: Secondary | ICD-10-CM | POA: Diagnosis not present

## 2024-02-27 DIAGNOSIS — Z79890 Hormone replacement therapy (postmenopausal): Secondary | ICD-10-CM | POA: Diagnosis not present

## 2024-02-27 NOTE — Addendum Note (Signed)
 Addended by: WATT RAISIN C on: 02/27/2024 04:26 PM   Modules accepted: Orders

## 2024-03-20 ENCOUNTER — Other Ambulatory Visit: Payer: Self-pay | Admitting: Family Medicine

## 2024-03-20 ENCOUNTER — Encounter: Payer: Self-pay | Admitting: Family Medicine

## 2024-03-20 NOTE — Telephone Encounter (Signed)
 I have reached out to patient and asked her to clarify her current dose of Mounjaro  and also asked her to come in and see me soon for follow-up

## 2024-03-21 MED ORDER — MOUNJARO 5 MG/0.5ML ~~LOC~~ SOAJ: 5.0000 mg | SUBCUTANEOUS | 0 refills | Status: DC

## 2024-04-05 ENCOUNTER — Other Ambulatory Visit: Payer: Self-pay | Admitting: Family Medicine

## 2024-04-05 DIAGNOSIS — R7303 Prediabetes: Secondary | ICD-10-CM

## 2024-04-22 ENCOUNTER — Other Ambulatory Visit: Payer: Self-pay | Admitting: Family Medicine

## 2024-04-27 ENCOUNTER — Other Ambulatory Visit: Payer: Self-pay | Admitting: Family Medicine

## 2024-05-02 ENCOUNTER — Telehealth: Payer: Self-pay

## 2024-05-02 NOTE — Telephone Encounter (Signed)
 PA initiated via Covermymeds; KEY: [id number]. Awaiting determination.

## 2024-05-04 NOTE — Patient Instructions (Incomplete)
 It was good to see you today, I will be in touch with your labs

## 2024-05-04 NOTE — Progress Notes (Unsigned)
 [facility] [address], [address] [place], [place] [address] [phone]  Date:  05/07/2024   Name:  Robyn Montes   DOB:  July 27, 1955   MRN:  [id number]  PCP:  Watt Harlene BROCKS, MD    Chief Complaint: No chief complaint on file.   History of Present Illness:  Robyn Montes is a 69 y.o. 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  She had complained of some nipple discharge last year and I ordered a diagnostic mammogram and ultrasound but these were not completed Cologuard is due for update Recommend Shingrix Recommend bone density scan Negative Pap 2023-she did have ASCUS but negative HPV in 2018.  Could repeat Pap for today if she would like Can update labs today   Levothyroxine  Lovastatin  Metformin  Metoprolol  Mounjaro  Aricept  losartan /hydrochlorothiazide   Discussed the use of AI scribe software for clinical note transcription with the patient, who gave verbal consent to proceed.  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   Chronic hepatitis C (HCC) 08/04/2014   History of other specified conditions presenting hazards to health 08/04/2014   Palpitations 10/03/2013   Essential hypertension 10/03/2013   Biliary calculi 01/17/2013    Past Medical History:  Diagnosis Date   Anemia 1976, 1984, 2001, 2003   history of multiple transfusions  (3); iron deficiency   Arthritis    hands, knees B; DDD lumbar   Blood transfusion without reported diagnosis    Family history of adverse reaction to anesthesia    pt's mother and daughter both had significant heart rate drops with anesthesia, believes her mother may have had a mini stroke while under anesthesia   Family history of heart disease    Heart murmur    Hepatitis C 04/17/2013   acquired from blood transfusion; s/p [facility] in [place].   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   Palpitations    Pre-diabetes    on metformin  for prediabetes   Shortness of breath    Sleep apnea    tested over 20 year ago, does not use cpap per patient    Past Surgical History:  Procedure Laterality Date   LIVER BIOPSY      Social History[1]  Family History  Problem Relation Age of Onset   Heart disease Mother        heart attack and stroke; passed away in her 42s   Diabetes Mother    Hyperlipidemia Mother    Hypertension Mother    Stroke Mother    Heart disease Father        passed away in his 69s   Hyperlipidemia Father    Diabetes Brother    Heart disease Brother    Hypertension Brother    Diabetes Brother    Hypertension Sister    Breast cancer Maternal Aunt  Allergies[2]  Medication list has been reviewed and updated.  Medications Ordered Prior to Encounter[3]  Review of Systems:  As per HPI- otherwise negative.   Physical Examination: There were no vitals filed for this visit. There were no vitals filed for this visit. There is no height or weight on file to calculate BMI. Ideal Body Weight:    GEN: no acute distress. HEENT: Atraumatic, Normocephalic.  Ears and Nose: No external deformity. CV: RRR, No M/G/R. No JVD. No thrill. No extra heart sounds. PULM: CTA B, no wheezes, crackles, rhonchi. No retractions. No resp. distress. No accessory muscle use. ABD: S, NT, ND, +BS. No rebound. No HSM. EXTR: No  c/c/e PSYCH: Normally interactive. Conversant.    Assessment and Plan: No diagnosis found.  Assessment & Plan   Signed Harlene Schroeder, MD    [1]  Social History Tobacco Use   Smoking status: Never   Smokeless tobacco: Never  Vaping Use   Vaping status: Never Used  Substance Use Topics   Alcohol use: No   Drug use: No  [2]  Allergies Allergen Reactions   Bee Venom Other (See Comments)    Unknown  [3]  Current Outpatient Medications on File Prior to Visit  Medication Sig Dispense Refill   Accu-Chek Softclix Lancets lancets USE TO CHECK BLOOD SUGAR 3 TIMES A DAY 300 each 1   Blood Glucose Monitoring Suppl (ACCU-CHEK GUIDE) w/Device KIT USE TO CHECK BLOOD SUGAR 3 TIMES A DAY 1 kit 0   Brimonidine Tartrate 0.025 % SOLN Place 1-2 drops into both eyes daily as needed (for dry eyes).     donepezil  (ARICEPT ) 5 MG tablet TAKE 1 TABLET BY MOUTH EVERYDAY AT BEDTIME 90 tablet 3   donepezil  (ARICEPT ) 5 MG tablet Take 1 tablet (5 mg total) by mouth 2 (two) times daily. Start with once a day for 1 month 60 tablet 3   donepezil  (ARICEPT ) 5 MG tablet START WITH ONE TABLET ONCE A DAY FOR 1 MONTH, THEN INCREASE TO 1 TABLET TWICE A DAY 180 tablet 2   EPINEPHrine  0.3 mg/0.3 mL IJ SOAJ injection Inject 0.3 mg into the skin once as needed for up to 1 dose for anaphylaxis. 2 each 3   glucose blood (ACCU-CHEK GUIDE) test strip USE TO CHECK BLOOD SUGAR 3 TIMES A DAY. DX CODE: E11.9 300 strip 12   hydroquinone  4 % cream APPLY TO AFFECTED AREA TOPICALLY DAILY FOR 2-3 WEEKS ONLY 28.35 g 1   Insulin Pen Needle (COMFORT EZ PEN NEEDLES) 32G X 5 MM MISC Use daily for Saxenda  injection 100 each 3   levothyroxine  (SYNTHROID ) 75 MCG tablet Take 1 tablet (75 mcg total) by mouth daily. 90 tablet 1   losartan -hydrochlorothiazide  (HYZAAR) 50-12.5 MG tablet Take 1 tablet by mouth 2 (two) times daily. 180 tablet 1   lovastatin  (MEVACOR ) 20 MG tablet TAKE 1 TABLET BY MOUTH EVERY DAY 90 tablet 3   metFORMIN   (GLUCOPHAGE ) 500 MG tablet Take 1 tablet (500 mg total) by mouth 2 (two) times daily with a meal. Due for appt 60 tablet 0   metoprolol  succinate (TOPROL -XL) 50 MG 24 hr tablet TAKE 1 TABLET BY MOUTH DAILY. TAKE WITH OR IMMEDIATELY FOLLOWING A MEAL. 90 tablet 3   Naphazoline HCl (CLEAR EYES OP) Place 2 drops into both eyes daily as needed (for dry eyes).     potassium chloride  (K-DUR) 10 MEQ tablet Take 1 tablet (10 mEq total) by mouth daily. 30 tablet 1   tirzepatide  (MOUNJARO ) 5  MG/0.5ML Pen Inject 5 mg into the skin once a week. 6 mL 0   tretinoin  (RETIN-A ) 0.05 % cream APPLY TOPICALLY AT BEDTIME AS NEEDED. 45 g 1   No current facility-administered medications on file prior to visit.   "

## 2024-05-05 NOTE — Telephone Encounter (Signed)
 PA approved.   This authorization is good until 04/16/2025

## 2024-05-07 ENCOUNTER — Other Ambulatory Visit (HOSPITAL_COMMUNITY)
Admission: RE | Admit: 2024-05-07 | Discharge: 2024-05-07 | Disposition: A | Discharge status: Home / Self Care | Source: Ambulatory Visit | Attending: Family Medicine | Admitting: Family Medicine

## 2024-05-07 ENCOUNTER — Encounter: Payer: Self-pay | Admitting: Family Medicine

## 2024-05-07 ENCOUNTER — Ambulatory Visit (INDEPENDENT_AMBULATORY_CARE_PROVIDER_SITE_OTHER): Admitting: Family Medicine

## 2024-05-07 ENCOUNTER — Other Ambulatory Visit (HOSPITAL_BASED_OUTPATIENT_CLINIC_OR_DEPARTMENT_OTHER): Payer: Self-pay | Admitting: Family Medicine

## 2024-05-07 VITALS — BP 130/80 | HR 78 | Temp 98.7°F | Resp 18 | Ht 64.5 in | Wt 197.4 lb

## 2024-05-07 DIAGNOSIS — R413 Other amnesia: Secondary | ICD-10-CM | POA: Diagnosis not present

## 2024-05-07 DIAGNOSIS — R8290 Unspecified abnormal findings in urine: Secondary | ICD-10-CM

## 2024-05-07 DIAGNOSIS — R7303 Prediabetes: Secondary | ICD-10-CM

## 2024-05-07 DIAGNOSIS — Z7985 Long-term (current) use of injectable non-insulin antidiabetic drugs: Secondary | ICD-10-CM | POA: Diagnosis not present

## 2024-05-07 DIAGNOSIS — Z0000 Encounter for general adult medical examination without abnormal findings: Secondary | ICD-10-CM

## 2024-05-07 DIAGNOSIS — L578 Other skin changes due to chronic exposure to nonionizing radiation: Secondary | ICD-10-CM

## 2024-05-07 DIAGNOSIS — E785 Hyperlipidemia, unspecified: Secondary | ICD-10-CM

## 2024-05-07 DIAGNOSIS — E039 Hypothyroidism, unspecified: Secondary | ICD-10-CM | POA: Diagnosis not present

## 2024-05-07 DIAGNOSIS — E049 Nontoxic goiter, unspecified: Secondary | ICD-10-CM | POA: Diagnosis not present

## 2024-05-07 DIAGNOSIS — Z1151 Encounter for screening for human papillomavirus (HPV): Secondary | ICD-10-CM | POA: Insufficient documentation

## 2024-05-07 DIAGNOSIS — Z124 Encounter for screening for malignant neoplasm of cervix: Secondary | ICD-10-CM

## 2024-05-07 DIAGNOSIS — E2839 Other primary ovarian failure: Secondary | ICD-10-CM

## 2024-05-07 DIAGNOSIS — Z1231 Encounter for screening mammogram for malignant neoplasm of breast: Secondary | ICD-10-CM

## 2024-05-07 DIAGNOSIS — I10 Essential (primary) hypertension: Secondary | ICD-10-CM | POA: Diagnosis not present

## 2024-05-07 DIAGNOSIS — Z113 Encounter for screening for infections with a predominantly sexual mode of transmission: Secondary | ICD-10-CM

## 2024-05-07 DIAGNOSIS — E119 Type 2 diabetes mellitus without complications: Secondary | ICD-10-CM

## 2024-05-07 DIAGNOSIS — Z01419 Encounter for gynecological examination (general) (routine) without abnormal findings: Secondary | ICD-10-CM | POA: Diagnosis present

## 2024-05-07 DIAGNOSIS — R635 Abnormal weight gain: Secondary | ICD-10-CM

## 2024-05-07 MED ORDER — HYDROQUINONE 4 % EX CREA: TOPICAL_CREAM | CUTANEOUS | 1 refills | Status: DC

## 2024-05-07 MED ORDER — LOVASTATIN 20 MG PO TABS: 20.0000 mg | ORAL_TABLET | Freq: Every day | ORAL | 3 refills | Status: AC

## 2024-05-07 MED ORDER — TRETINOIN 0.05 % EX CREA: TOPICAL_CREAM | Freq: Every evening | CUTANEOUS | 1 refills | Status: AC | PRN

## 2024-05-07 MED ORDER — LEVOTHYROXINE SODIUM 75 MCG PO TABS: 75.0000 ug | ORAL_TABLET | Freq: Every day | ORAL | 1 refills | Status: AC

## 2024-05-07 MED ORDER — DONEPEZIL HCL 5 MG PO TABS: 5.0000 mg | ORAL_TABLET | Freq: Two times a day (BID) | ORAL | 3 refills | Status: AC

## 2024-05-07 MED ORDER — METFORMIN HCL 500 MG PO TABS: 500.0000 mg | ORAL_TABLET | Freq: Two times a day (BID) | ORAL | 3 refills | Status: AC

## 2024-05-07 MED ORDER — METOPROLOL SUCCINATE ER 50 MG PO TB24: 50.0000 mg | ORAL_TABLET | Freq: Every day | ORAL | 3 refills | Status: AC

## 2024-05-07 MED ORDER — MOUNJARO 5 MG/0.5ML ~~LOC~~ SOAJ: 5.0000 mg | SUBCUTANEOUS | 3 refills | Status: AC

## 2024-05-07 MED ORDER — ACCU-CHEK GUIDE W/DEVICE KIT: PACK | 0 refills | Status: AC

## 2024-05-07 MED ORDER — LOSARTAN POTASSIUM-HCTZ 50-12.5 MG PO TABS: 1.0000 | ORAL_TABLET | Freq: Two times a day (BID) | ORAL | 3 refills | Status: AC

## 2024-05-08 LAB — URINE CULTURE
MICRO NUMBER:: 17496893
Result:: NO GROWTH
SPECIMEN QUALITY:: ADEQUATE

## 2024-05-08 LAB — COMPREHENSIVE METABOLIC PANEL WITH GFR
ALT: 10 U/L (ref 3–35)
AST: 19 U/L (ref 5–37)
Albumin: 4.4 g/dL (ref 3.5–5.2)
Alkaline Phosphatase: 66 U/L (ref 39–117)
BUN: 7 mg/dL (ref 6–23)
CO2: 31 meq/L (ref 19–32)
Calcium: 9.8 mg/dL (ref 8.4–10.5)
Chloride: 103 meq/L (ref 96–112)
Creatinine, Ser: 0.79 mg/dL (ref 0.40–1.20)
GFR: 77.57 mL/min
Glucose, Bld: 78 mg/dL (ref 70–99)
Potassium: 3.9 meq/L (ref 3.5–5.1)
Sodium: 141 meq/L (ref 135–145)
Total Bilirubin: 0.5 mg/dL (ref 0.2–1.2)
Total Protein: 7.6 g/dL (ref 6.0–8.3)

## 2024-05-08 LAB — CBC
HCT: 38.2 % (ref 36.0–46.0)
Hemoglobin: 12.6 g/dL (ref 12.0–15.0)
MCHC: 32.8 g/dL (ref 30.0–36.0)
MCV: 83.3 fl (ref 78.0–100.0)
Platelets: 221 K/uL (ref 150.0–400.0)
RBC: 4.59 Mil/uL (ref 3.87–5.11)
RDW: 15.8 % — ABNORMAL HIGH (ref 11.5–15.5)
WBC: 8.8 K/uL (ref 4.0–10.5)

## 2024-05-08 LAB — MICROALBUMIN / CREATININE URINE RATIO
Creatinine,U: 117.1 mg/dL
Microalb Creat Ratio: 6.9 mg/g (ref 0.0–30.0)
Microalb, Ur: 0.8 mg/dL (ref 0.7–1.9)

## 2024-05-08 LAB — LIPID PANEL
Cholesterol: 185 mg/dL (ref 28–200)
HDL: 49 mg/dL
LDL Cholesterol: 124 mg/dL — ABNORMAL HIGH (ref 10–99)
NonHDL: 136.45
Total CHOL/HDL Ratio: 4
Triglycerides: 60 mg/dL (ref 10.0–149.0)
VLDL: 12 mg/dL (ref 0.0–40.0)

## 2024-05-08 LAB — HEMOGLOBIN A1C: Hgb A1c MFr Bld: 5.6 % (ref 4.6–6.5)

## 2024-05-08 LAB — TSH: TSH: 2.51 u[IU]/mL (ref 0.35–5.50)

## 2024-05-08 LAB — CORTISOL: Cortisol, Plasma: 5.7 ug/dL

## 2024-05-08 LAB — HIV ANTIBODY (ROUTINE TESTING W REFLEX)
HIV 1&2 Ab, 4th Generation: NONREACTIVE
HIV FINAL INTERPRETATION: NEGATIVE

## 2024-05-09 ENCOUNTER — Encounter: Payer: Self-pay | Admitting: Family Medicine

## 2024-05-12 ENCOUNTER — Encounter: Payer: Self-pay | Admitting: Family Medicine

## 2024-05-12 LAB — CYTOLOGY - PAP
Chlamydia: NEGATIVE
Comment: NEGATIVE
Comment: NEGATIVE
Comment: NORMAL
Diagnosis: NEGATIVE
High risk HPV: NEGATIVE
Neisseria Gonorrhea: NEGATIVE

## 2024-05-27 ENCOUNTER — Inpatient Hospital Stay (HOSPITAL_BASED_OUTPATIENT_CLINIC_OR_DEPARTMENT_OTHER): Admission: RE | Admit: 2024-05-27 | Source: Ambulatory Visit

## 2024-05-27 ENCOUNTER — Ambulatory Visit (HOSPITAL_BASED_OUTPATIENT_CLINIC_OR_DEPARTMENT_OTHER)

## 2024-05-27 ENCOUNTER — Other Ambulatory Visit (HOSPITAL_BASED_OUTPATIENT_CLINIC_OR_DEPARTMENT_OTHER)

## 2024-11-05 ENCOUNTER — Ambulatory Visit
